# Patient Record
Sex: Female | Born: 1990
Health system: Southern US, Community
[De-identification: ages and names within clinical notes are randomized; demographics above are authoritative.]

## PROBLEM LIST (undated history)

## (undated) DIAGNOSIS — E039 Hypothyroidism, unspecified: Secondary | ICD-10-CM

## (undated) DIAGNOSIS — Z8669 Personal history of other diseases of the nervous system and sense organs: Secondary | ICD-10-CM

## (undated) DIAGNOSIS — S83519A Sprain of anterior cruciate ligament of unspecified knee, initial encounter: Secondary | ICD-10-CM

## (undated) DIAGNOSIS — Q8501 Neurofibromatosis, type 1: Secondary | ICD-10-CM

## (undated) DIAGNOSIS — J45909 Unspecified asthma, uncomplicated: Secondary | ICD-10-CM

## (undated) DIAGNOSIS — R51 Headache: Secondary | ICD-10-CM

## (undated) DIAGNOSIS — J45998 Other asthma: Secondary | ICD-10-CM

## (undated) DIAGNOSIS — Z8679 Personal history of other diseases of the circulatory system: Secondary | ICD-10-CM

## (undated) HISTORY — DX: Neurofibromatosis, type 1: Q85.01

## (undated) HISTORY — PX: TUMOR EXCISION: SHX421

## (undated) HISTORY — PX: STRABISMUS SURGERY: SHX218

## (undated) HISTORY — PX: ANTERIOR CRUCIATE LIGAMENT REPAIR: SHX115

## (undated) HISTORY — DX: Unspecified asthma, uncomplicated: J45.909

---
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
## 2011-02-14 ENCOUNTER — Emergency Department (HOSPITAL_COMMUNITY)
Admission: EM | Admit: 2011-02-14 | Discharge: 2011-02-14 | Disposition: A | Discharge status: Home / Self Care | Payer: BC Managed Care – PPO | Attending: Emergency Medicine | Admitting: Emergency Medicine

## 2011-02-14 ENCOUNTER — Emergency Department (HOSPITAL_COMMUNITY): Payer: BC Managed Care – PPO

## 2011-02-14 DIAGNOSIS — S060X0A Concussion without loss of consciousness, initial encounter: Secondary | ICD-10-CM | POA: Insufficient documentation

## 2011-02-14 DIAGNOSIS — W11XXXA Fall on and from ladder, initial encounter: Secondary | ICD-10-CM | POA: Insufficient documentation

## 2011-02-14 DIAGNOSIS — Y9269 Other specified industrial and construction area as the place of occurrence of the external cause: Secondary | ICD-10-CM | POA: Insufficient documentation

## 2011-02-14 DIAGNOSIS — T148XXA Other injury of unspecified body region, initial encounter: Secondary | ICD-10-CM | POA: Insufficient documentation

## 2011-02-14 DIAGNOSIS — Y990 Civilian activity done for income or pay: Secondary | ICD-10-CM | POA: Insufficient documentation

## 2011-03-19 ENCOUNTER — Other Ambulatory Visit: Payer: BC Managed Care – PPO

## 2011-12-13 ENCOUNTER — Encounter: Payer: Self-pay | Admitting: Gynecology

## 2011-12-14 ENCOUNTER — Encounter: Payer: Self-pay | Admitting: Gynecology

## 2012-01-26 ENCOUNTER — Ambulatory Visit (INDEPENDENT_AMBULATORY_CARE_PROVIDER_SITE_OTHER): Payer: BC Managed Care – PPO | Admitting: Family Medicine

## 2012-01-26 VITALS — BP 116/72 | HR 98 | Temp 98.8°F | Resp 18 | Ht 61.0 in | Wt 144.2 lb

## 2012-01-26 DIAGNOSIS — R059 Cough, unspecified: Secondary | ICD-10-CM

## 2012-01-26 DIAGNOSIS — Z8709 Personal history of other diseases of the respiratory system: Secondary | ICD-10-CM

## 2012-01-26 DIAGNOSIS — J302 Other seasonal allergic rhinitis: Secondary | ICD-10-CM

## 2012-01-26 DIAGNOSIS — Z87898 Personal history of other specified conditions: Secondary | ICD-10-CM

## 2012-01-26 DIAGNOSIS — R05 Cough: Secondary | ICD-10-CM

## 2012-01-26 DIAGNOSIS — J309 Allergic rhinitis, unspecified: Secondary | ICD-10-CM

## 2012-01-26 MED ORDER — IPRATROPIUM BROMIDE 0.02 % IN SOLN
0.5000 mg | Freq: Once | RESPIRATORY_TRACT | Status: AC
  Administered 2012-01-26: 0.5 mg via RESPIRATORY_TRACT

## 2012-01-26 MED ORDER — FLUTICASONE PROPIONATE 50 MCG/ACT NA SUSP: 2.0000 | Freq: Every day | NASAL | Status: DC

## 2012-01-26 MED ORDER — ALBUTEROL SULFATE (2.5 MG/3ML) 0.083% IN NEBU
2.5000 mg | INHALATION_SOLUTION | Freq: Once | RESPIRATORY_TRACT | Status: AC
  Administered 2012-01-26: 2.5 mg via RESPIRATORY_TRACT

## 2012-01-26 MED ORDER — ALBUTEROL SULFATE HFA 108 (90 BASE) MCG/ACT IN AERS: 2.0000 | INHALATION_SPRAY | Freq: Four times a day (QID) | RESPIRATORY_TRACT | Status: DC | PRN

## 2012-01-26 NOTE — Progress Notes (Signed)
[facility]:  Office Visit  Chief Complaint:  Chief Complaint  Patient presents with  . Allergic Rhinitis     x 1 month   . Cough    wheezing x 2 weeks  . Shortness of Breath    some times    HPI: Loretta [name] is a 21 y.o. female who complains of  2 week history of SOB due to pollen season. HAs allergies but not taking anything for it except Claritin. She ran out of her albuterol inhaler and has had more sxs.   Past Medical History  Diagnosis Date  . Allergy   . Asthma    Past Surgical History  Procedure Date  . Eye surgery     LEFT   History   Social History  . Marital Status: Single    Spouse Name: N/A    Number of Children: N/A  . Years of Education: N/A   Social History Main Topics  . Smoking status: Never Smoker   . Smokeless tobacco: None  . Alcohol Use: None  . Drug Use: None  . Sexually Active: None   Other Topics Concern  . None   Social History Narrative  . None   Family History  Problem Relation Age of Onset  . Diabetes Maternal Grandmother    No Known Allergies Prior to Admission medications   Medication Sig Start Date End Date Taking? Authorizing Provider  loratadine (CLARITIN) 10 MG tablet Take 10 mg by mouth daily.   Yes Historical Provider, MD  norgestimate-ethinyl estradiol (ORTHO-CYCLEN,SPRINTEC,PREVIFEM) 0.25-35 MG-MCG tablet Take 1 tablet by mouth daily.    Historical Provider, MD     ROS: The patient denies fevers, chills, night sweats, unintentional weight loss, chest pain, palpitations, wheezing, dyspnea on exertion, nausea, vomiting, abdominal pain, dysuria, hematuria, melena, numbness, weakness, or tingling. + wheeze, SOB  All other systems have been reviewed and were otherwise negative with the exception of those mentioned in the HPI and as above.    PHYSICAL EXAM: Filed Vitals:   01/26/12 1408  BP: 116/72  Pulse: 98  Temp: 98.8 F (37.1 C)  Resp: 18  Spo2 98% Filed Vitals:   01/26/12 1408    Height: 5\' 1"  (1.549 m)  Weight: 144 lb 3.2 oz (65.409 kg)   Body mass index is 27.25 kg/(m^2).  General: Alert, no acute distress HEENT:  Normocephalic, atraumatic, oropharynx patent. TM nl, no exudates, no sinus tenderness. Nasal passage red, boggy Cardiovascular:  Regular rate and rhythm, no rubs murmurs or gallops.  No Carotid bruits, radial pulse intact. No pedal edema.  Respiratory: Clear to auscultation bilaterally.  No wheezes, rales, or rhonchi.  No cyanosis, no use of accessory musculature. Bs nl, however air movement slight constricted.  GI: No organomegaly, abdomen is soft and non-tender, positive bowel sounds.  No masses. Skin: No rashes. Neurologic: Facial musculature symmetric. Psychiatric: Patient is appropriate throughout our interaction. Lymphatic: No cervical lymphadenopathy Musculoskeletal: Gait intact.   LABS: No results found for this or any previous visit.   EKG/XRAY:   Primary read interpreted by Dr. Conley Rolls at [facility]   ASSESSMENT/PLAN: Encounter Diagnoses  Name Primary?  . History of wheezing Yes  . Cough   . Allergic rhinitis   . Seasonal allergies     1. Albuterol INH 2. OTC Claritin 3. Flonase  Patient felt better after neb treatment. She had more air flow and fuller BS. She never had wheezes.   Hamilton Capri PHUONG, DO 01/26/2012 3:19 PM

## 2012-02-17 ENCOUNTER — Ambulatory Visit: Payer: Self-pay | Admitting: Internal Medicine

## 2012-02-18 ENCOUNTER — Ambulatory Visit: Payer: Self-pay | Admitting: Internal Medicine

## 2012-03-27 ENCOUNTER — Ambulatory Visit (INDEPENDENT_AMBULATORY_CARE_PROVIDER_SITE_OTHER): Payer: BC Managed Care – PPO | Admitting: Internal Medicine

## 2012-03-27 ENCOUNTER — Encounter: Payer: Self-pay | Admitting: Internal Medicine

## 2012-03-27 VITALS — BP 112/74 | HR 69 | Temp 98.3°F | Ht 60.5 in | Wt 143.0 lb

## 2012-03-27 DIAGNOSIS — J309 Allergic rhinitis, unspecified: Secondary | ICD-10-CM

## 2012-03-27 DIAGNOSIS — J45909 Unspecified asthma, uncomplicated: Secondary | ICD-10-CM | POA: Insufficient documentation

## 2012-03-27 DIAGNOSIS — Z0000 Encounter for general adult medical examination without abnormal findings: Secondary | ICD-10-CM

## 2012-03-27 HISTORY — DX: Unspecified asthma, uncomplicated: J45.909

## 2012-03-27 NOTE — Assessment & Plan Note (Signed)
See Dr. Lily Peer in few months. Recommend to bring records from her pediatrician specifically immunizations She remembers taking the HPV immunization, not sure about Menactra

## 2012-03-27 NOTE — Assessment & Plan Note (Addendum)
Controlled, take medicines as needed

## 2012-03-27 NOTE — Progress Notes (Signed)
  Subjective:    Patient ID: Loretta [name], female    DOB: Sep 01, 1991, 21 y.o.   MRN: [id number]  HPI New patient, here to get established. In general feels well. Asthma,  uses albuterol very rarely, last time she used it was in April. Allergies, uses medication when necessary  Past medical history Asthma    Past surgical history L eye surgery at months of age d/t a "tumor", vision is ok R eye surgery?  Social history Lives w/ parents, has 2 sister in [place] Attends [employer] Tobacco--no ETOH-- no Drugs-- denies  Family history Diabetes-- no CAD--no Stroke--no Colon cancer--no Breast cancer--no Prostate cancer--no   Review of Systems No recent cough or wheezing No recent runny nose, sore throat. Denies any anxiety or depression. No problems sleeping. She sees her gynecologist routinely, Dr. Lily Peer.     Objective:   Physical Exam  General -- alert, well-developed, and well-nourished.   Neck --no thyromegaly Lungs -- normal respiratory effort, no intercostal retractions, no accessory muscle use, and normal breath sounds.   Heart-- normal rate, regular rhythm, no murmur, and no gallop.   Extremities-- no pretibial edema bilaterally Neurologic-- alert & oriented X3 and strength normal in all extremities. Psych-- Cognition and judgment appear intact. Alert and cooperative with normal attention span and concentration.  not anxious appearing and not depressed appearing.      Assessment & Plan:

## 2012-03-27 NOTE — Assessment & Plan Note (Addendum)
Well-controlled,  take medicines as needed

## 2012-04-04 ENCOUNTER — Ambulatory Visit (INDEPENDENT_AMBULATORY_CARE_PROVIDER_SITE_OTHER): Payer: BC Managed Care – PPO

## 2012-04-04 ENCOUNTER — Ambulatory Visit (INDEPENDENT_AMBULATORY_CARE_PROVIDER_SITE_OTHER): Payer: BC Managed Care – PPO | Admitting: Gynecology

## 2012-04-04 ENCOUNTER — Encounter: Payer: Self-pay | Admitting: Gynecology

## 2012-04-04 VITALS — BP 110/70 | Ht 61.0 in | Wt 149.0 lb

## 2012-04-04 DIAGNOSIS — Z8742 Personal history of other diseases of the female genital tract: Secondary | ICD-10-CM

## 2012-04-04 DIAGNOSIS — E079 Disorder of thyroid, unspecified: Secondary | ICD-10-CM

## 2012-04-04 DIAGNOSIS — N946 Dysmenorrhea, unspecified: Secondary | ICD-10-CM

## 2012-04-04 DIAGNOSIS — Z01419 Encounter for gynecological examination (general) (routine) without abnormal findings: Secondary | ICD-10-CM

## 2012-04-04 DIAGNOSIS — N831 Corpus luteum cyst of ovary, unspecified side: Secondary | ICD-10-CM

## 2012-04-04 DIAGNOSIS — R635 Abnormal weight gain: Secondary | ICD-10-CM

## 2012-04-04 LAB — CBC WITH DIFFERENTIAL/PLATELET
Basophils Absolute: 0.1 10*3/uL (ref 0.0–0.1)
Eosinophils Absolute: 0.1 10*3/uL (ref 0.0–0.7)
Eosinophils Relative: 2 % (ref 0–5)
Lymphocytes Relative: 36 % (ref 12–46)
MCV: 82.5 fL (ref 78.0–100.0)
Neutrophils Relative %: 52 % (ref 43–77)
Platelets: 325 10*3/uL (ref 150–400)
RDW: 14.2 % (ref 11.5–15.5)
WBC: 5.7 10*3/uL (ref 4.0–10.5)

## 2012-04-04 NOTE — Patient Instructions (Signed)

## 2012-04-04 NOTE — Progress Notes (Signed)
Loretta [name] 1991-01-04 [id number]   History:    21 y.o.  for annual gyn exam with no complaints. Patient has never been sexually active. Last year we had done an ultrasound she had a small avascular cyst measuring 13 x 8 mm there was a question whether was a dermoid or not. Patient is having normal menstrual cycles. Last year she had been placed on oral contraceptive pill for her dysmenorrhea menorrhagia and for cycle control but she has discontinued it. She states her cycles are regular on its own lasting 3-5 days. Review of her record again she has continued to gain weight she was weighing 136 and is up to 149.  Past medical history,surgical history, family history and social history were all reviewed and documented in the EPIC chart.  Gynecologic History Patient's last menstrual period was 03/17/2012. Contraception: none Last Pap: No prior study. Results were: No prior study Last mammogram: Not indicated. Results were: Not indicated  Obstetric History OB History    Grav Para Term Preterm Abortions TAB SAB Ect Mult Living                   ROS: A ROS was performed and pertinent positives and negatives are included in the history.  GENERAL: No fevers or chills. HEENT: No change in vision, no earache, sore throat or sinus congestion. NECK: No pain or stiffness. CARDIOVASCULAR: No chest pain or pressure. No palpitations. PULMONARY: No shortness of breath, cough or wheeze. GASTROINTESTINAL: No abdominal pain, nausea, vomiting or diarrhea, melena or bright red blood per rectum. GENITOURINARY: No urinary frequency, urgency, hesitancy or dysuria. MUSCULOSKELETAL: No joint or muscle pain, no back pain, no recent trauma. DERMATOLOGIC: No rash, no itching, no lesions. ENDOCRINE: No polyuria, polydipsia, no heat or cold intolerance. No recent change in weight. HEMATOLOGICAL: No anemia or easy bruising or bleeding. NEUROLOGIC: No headache, seizures, numbness, tingling or weakness. PSYCHIATRIC: No  depression, no loss of interest in normal activity or change in sleep pattern.     Exam: chaperone present  BP 110/70  Ht 5\' 1"  (1.549 m)  Wt 149 lb (67.586 kg)  BMI 28.15 kg/m2  LMP 03/17/2012  Body mass index is 28.15 kg/(m^2).  General appearance : Well developed well nourished female. No acute distress HEENT: Neck supple, trachea midline, no carotid bruits, no thyroidmegaly Lungs: Clear to auscultation, no rhonchi or wheezes, or rib retractions  Heart: Regular rate and rhythm, no murmurs or gallops Breast:Examined in sitting and supine position were symmetrical in appearance, no palpable masses or tenderness,  no skin retraction, no nipple inversion, no nipple discharge, no skin discoloration, no axillary or supraclavicular lymphadenopathy Abdomen: no palpable masses or tenderness, no rebound or guarding Extremities: no edema or skin discoloration or tenderness  Pelvic:  Not done due to the fact that patient has never been sexually active. She will have an ultrasound today because of her dysmenorrhea and past history of ovarian cysts for followup.  Ultrasound result: Uterus measures 9.0 x 4.7 x 3.7 cm endometrial stripe 16.3 mm (patient currently on day 17 of her cycle. Right ovary was normal. Left ovary corpus luteum cyst measuring 22 x 20 mm was noted previous echogenic focus from prior ultrasound exam was not seen. Cul-de-sac was negative.   Assessment/Plan:  21 y.o. female for annual exam patient was encouraged to do her monthly self breast examination. The following labs will be drawn today CBC, TSH, fasting blood sugar, urinalysis. She will not need a Pap smear until this year  which she turns 22. New Pap smear screening guidelines discussed. She was instructed to take ibuprofen for her dysmenorrhea. We discussed importance of regular exercise and diet which she started to do so.    Ok Edwards MD, 11:30 AM 04/04/2012

## 2012-04-05 LAB — URINALYSIS W MICROSCOPIC + REFLEX CULTURE
Bacteria, UA: NONE SEEN
Bilirubin Urine: NEGATIVE
Glucose, UA: NEGATIVE mg/dL
Hgb urine dipstick: NEGATIVE
Protein, ur: NEGATIVE mg/dL

## 2012-04-05 NOTE — Addendum Note (Signed)
Addended by: Venora Maples on: 04/05/2012 11:20 AM   Modules accepted: Orders

## 2012-04-12 ENCOUNTER — Other Ambulatory Visit: Payer: BC Managed Care – PPO

## 2012-04-12 DIAGNOSIS — E079 Disorder of thyroid, unspecified: Secondary | ICD-10-CM

## 2012-04-12 LAB — THYROID PANEL WITH TSH
T3 Uptake: 36.3 % (ref 22.5–37.0)
T4, Total: 6.6 ug/dL (ref 5.0–12.5)

## 2012-05-02 ENCOUNTER — Institutional Professional Consult (permissible substitution): Payer: Self-pay | Admitting: Gynecology

## 2012-05-05 ENCOUNTER — Encounter: Payer: Self-pay | Admitting: Gynecology

## 2012-05-05 ENCOUNTER — Ambulatory Visit (INDEPENDENT_AMBULATORY_CARE_PROVIDER_SITE_OTHER): Payer: BC Managed Care – PPO | Admitting: Gynecology

## 2012-05-05 VITALS — BP 118/70

## 2012-05-05 DIAGNOSIS — E039 Hypothyroidism, unspecified: Secondary | ICD-10-CM

## 2012-05-05 MED ORDER — LEVOTHYROXINE SODIUM 25 MCG PO TABS: 25.0000 ug | ORAL_TABLET | Freq: Every day | ORAL | Status: DC

## 2012-05-05 NOTE — Progress Notes (Signed)
Patient is a 21 year old who was seen in the office for her annual gynecological examination on 04/04/2012 please see previous encounter for detail. She had been complaining of increasing weight despite regular exercise. Her menstrual cycles most of the time are regular. She is not sexually active and not using any form of contraception. She presented to the office today to discuss her laboratory results and plan a course of management. Her lab results are as follows:  Results for ROMY, IPOCK (MRN [id number]) as of 05/05/2012 14:29  Ref. Range 04/04/2012 11:23 04/12/2012 11:03  Glucose Latest Range: 70-99 mg/dL 77   WBC Latest Range: 4.0-10.5 K/uL 5.7   RBC Latest Range: 3.87-5.11 MIL/uL 4.34   Hemoglobin Latest Range: 12.0-15.0 g/dL 40.9   HCT Latest Range: 36.0-46.0 % 35.8 (L)   MCV Latest Range: 78.0-100.0 fL 82.5   MCH Latest Range: 26.0-34.0 pg 28.6   MCHC Latest Range: 30.0-36.0 g/dL 81.1   RDW Latest Range: 11.5-15.5 % 14.2   Platelets Latest Range: 150-400 K/uL 325   Neutrophils Relative Latest Range: 43-77 % 52   Lymphocytes Relative Latest Range: 12-46 % 36   Monocytes Relative Latest Range: 3-12 % 9   Eosinophils Relative Latest Range: 0-5 % 2   Basophils Relative Latest Range: 0-1 % 1   NEUT# Latest Range: 1.7-7.7 K/uL 3.0   Lymphocytes Absolute Latest Range: 0.7-4.0 K/uL 2.0   Monocytes Absolute Latest Range: 0.1-1.0 K/uL 0.5   Eosinophils Absolute Latest Range: 0.0-0.7 K/uL 0.1   Basophils Absolute Latest Range: 0.0-0.1 K/uL 0.1   Smear Review No range found Criteria for review not met   TSH Latest Range: 0.350-4.500 uIU/mL 7.554 (H) 4.769 (H)  T4, Total Latest Range: 5.0-12.5 ug/dL  6.6  Free Thyroxine Index Latest Range: 1.0-3.9   2.4    Based on patient's clinical symptomatology and thyroid function test indicates that she is hypothyroid. She did have her TSH drawn 2 times to confirm the diagnosis as noted above. We discussed treatment such as with supplemental thyroid  medication and she will be placed on Synthroid 25 mcg daily. She will return back to the office in 6 weeks for a TSH followup and she will see me in the office 3 days afterwards. Literature information was provided on hypothyroidism. All questions were answered and we'll follow accordingly.

## 2012-05-05 NOTE — Patient Instructions (Signed)

## 2012-05-10 ENCOUNTER — Institutional Professional Consult (permissible substitution): Payer: Self-pay | Admitting: Gynecology

## 2012-06-27 ENCOUNTER — Other Ambulatory Visit: Payer: Self-pay

## 2012-06-27 ENCOUNTER — Ambulatory Visit: Payer: Self-pay | Admitting: Gynecology

## 2012-06-28 ENCOUNTER — Other Ambulatory Visit: Payer: BC Managed Care – PPO

## 2012-06-28 DIAGNOSIS — E039 Hypothyroidism, unspecified: Secondary | ICD-10-CM

## 2012-06-29 ENCOUNTER — Other Ambulatory Visit: Payer: Self-pay

## 2012-06-29 ENCOUNTER — Ambulatory Visit (INDEPENDENT_AMBULATORY_CARE_PROVIDER_SITE_OTHER): Payer: BC Managed Care – PPO | Admitting: Gynecology

## 2012-06-29 ENCOUNTER — Other Ambulatory Visit: Payer: Self-pay | Admitting: Gynecology

## 2012-06-29 ENCOUNTER — Encounter: Payer: Self-pay | Admitting: Gynecology

## 2012-06-29 VITALS — BP 122/78

## 2012-06-29 DIAGNOSIS — Z23 Encounter for immunization: Secondary | ICD-10-CM

## 2012-06-29 DIAGNOSIS — E039 Hypothyroidism, unspecified: Secondary | ICD-10-CM

## 2012-06-29 MED ORDER — LEVOTHYROXINE SODIUM 50 MCG PO TABS: 50.0000 ug | ORAL_TABLET | Freq: Every day | ORAL | Status: DC

## 2012-06-29 NOTE — Progress Notes (Signed)
Patient presented to the office today for followup. She was recently diagnosed with hypothyroidism when she was seen the office on July 9 for annual exam. Her TSH was found to be elevated at 7.554 and it was repeated again on 04/12/2012 was still elevated at 4.769. She was started on Synthroid 25 mcg daily and she has been on it for 6 weeks. Yesterday's TSH was 4.561 so we are going to increase her Synthroid to 50 mcg daily. Patient stated she's getting a little bit of drainage level back she starts exercise.  Exam: Neck: No thyromegaly and no carotid bruits  Assessment/plan: Patient with hypothyroidism Synthroid was adjusted. Patient will return back in 6 weeks for followup TSH. Patient received her flu vaccine today.

## 2012-08-22 ENCOUNTER — Other Ambulatory Visit: Payer: BC Managed Care – PPO

## 2012-09-29 ENCOUNTER — Other Ambulatory Visit: Payer: Self-pay | Admitting: *Deleted

## 2012-09-29 ENCOUNTER — Other Ambulatory Visit: Payer: BC Managed Care – PPO

## 2012-09-29 ENCOUNTER — Other Ambulatory Visit: Payer: Self-pay | Admitting: Gynecology

## 2012-09-29 DIAGNOSIS — E039 Hypothyroidism, unspecified: Secondary | ICD-10-CM

## 2012-10-01 ENCOUNTER — Other Ambulatory Visit: Payer: Self-pay | Admitting: Gynecology

## 2012-12-28 ENCOUNTER — Other Ambulatory Visit: Payer: Self-pay | Admitting: Gynecology

## 2013-01-22 ENCOUNTER — Encounter: Payer: Self-pay | Admitting: Internal Medicine

## 2013-01-22 ENCOUNTER — Ambulatory Visit (INDEPENDENT_AMBULATORY_CARE_PROVIDER_SITE_OTHER): Payer: BC Managed Care – PPO | Admitting: Internal Medicine

## 2013-01-22 VITALS — BP 128/80 | HR 80 | Temp 98.2°F | Wt 148.0 lb

## 2013-01-22 DIAGNOSIS — E039 Hypothyroidism, unspecified: Secondary | ICD-10-CM

## 2013-01-22 DIAGNOSIS — J309 Allergic rhinitis, unspecified: Secondary | ICD-10-CM

## 2013-01-22 DIAGNOSIS — J45909 Unspecified asthma, uncomplicated: Secondary | ICD-10-CM

## 2013-01-22 MED ORDER — MOMETASONE FURO-FORMOTEROL FUM 100-5 MCG/ACT IN AERO: 2.0000 | INHALATION_SPRAY | Freq: Two times a day (BID) | RESPIRATORY_TRACT | Status: DC

## 2013-01-22 MED ORDER — PREDNISONE 10 MG PO TABS: ORAL_TABLET | ORAL | Status: DC

## 2013-01-22 NOTE — Patient Instructions (Addendum)
Use dulera 2 puffs twice a day. Albuterol as needed is wheezing or cough (2 puffs 4 times a day as needed) Call if you have severe symptoms. Discontinue Claritin, start Zyrtec OTC 10 mg daily Continue Flonase and other medications. Please come back in one month.

## 2013-01-22 NOTE — Assessment & Plan Note (Signed)
Requests a TSH check. Will do.

## 2013-01-22 NOTE — Progress Notes (Signed)
  Subjective:    Patient ID: Loretta [name], female    DOB: December 07, 1990, 22 y.o.   MRN: [id number]  HPI Acute visit Since the polen season started her wheezing is  > baseline, having  itchy eyes, her cough has definitely increased. This morning, she cough so hard that she almost vomited.  Past Medical History  Diagnosis Date  . Allergy   . Asthma    Past Surgical History  Procedure Laterality Date  . Eye surgery      LEFT     Review of Systems No fever chills She coughs clear sputum From time to time, has clear nasal discharge. No nausea, vomiting, diarrhea. She's not taken any birth control pills, she is denies being sexually active. Taking Flonase regularly.     Objective:   Physical Exam General -- alert, well-developed, No respiratory distress HEENT -- TMs normal, throat w/o redness, face symmetric and not tender to palpation, Nose is slightly congested Lungs -- normal respiratory effort, no intercostal retractions, no accessory muscle use, Increased expiratory time but otherwise no wheezing or rhonchi Heart-- normal rate, regular rhythm, no murmur, and no gallop.    Neurologic-- alert & oriented X3 and strength normal in all extremities. Psych-- Cognition and judgment appear intact. Alert and cooperative with normal attention span and concentration.  not anxious appearing and not depressed appearing.       Assessment & Plan:

## 2013-01-22 NOTE — Assessment & Plan Note (Signed)
Asthma, Although the patient is not wheezing today she is coughing significantly more lately and reports increased symptoms in the last few days. Plan: Start dulera, albuterol when necessary, Reassess in 4 weeks.

## 2013-01-22 NOTE — Assessment & Plan Note (Signed)
allergies,Claritin is not helping, change to Zyrtec, prednisone.

## 2013-01-24 ENCOUNTER — Encounter: Payer: Self-pay | Admitting: *Deleted

## 2013-01-24 NOTE — Progress Notes (Signed)
Quick Note:  Send a Therapist, music, Your thyroid tests looks good, continue taking the same dose of Synthroid, recheck your thyroid in 6 months. ______

## 2013-02-13 ENCOUNTER — Encounter: Payer: Self-pay | Admitting: Gynecology

## 2013-02-13 ENCOUNTER — Ambulatory Visit (INDEPENDENT_AMBULATORY_CARE_PROVIDER_SITE_OTHER): Payer: BC Managed Care – PPO | Admitting: Gynecology

## 2013-02-13 ENCOUNTER — Other Ambulatory Visit: Payer: Self-pay | Admitting: *Deleted

## 2013-02-13 ENCOUNTER — Telehealth: Payer: Self-pay | Admitting: *Deleted

## 2013-02-13 VITALS — BP 120/78

## 2013-02-13 DIAGNOSIS — N63 Unspecified lump in unspecified breast: Secondary | ICD-10-CM

## 2013-02-13 DIAGNOSIS — N643 Galactorrhea not associated with childbirth: Secondary | ICD-10-CM

## 2013-02-13 DIAGNOSIS — N6320 Unspecified lump in the left breast, unspecified quadrant: Secondary | ICD-10-CM

## 2013-02-13 NOTE — Telephone Encounter (Signed)
Apt per [facility] at [facility] - only Breast US needed on Left Breast. Apt 02/20/13 at 1110am  arrive 1100am.  LM for pt

## 2013-02-13 NOTE — Telephone Encounter (Signed)
Message copied by Richardson Chiquito on Tue Feb 13, 2013  4:26 PM ------      Message from: Ok Edwards      Created: Tue Feb 13, 2013 11:01 AM       Please schedule ultrasound of left breast on this patient and possible diagnostic mammogram for left breast pain along with a palpable nodule between 12-1 o'clock periareolar region along with unilateral galactorrhea. Normal right breast and no supraclavicular or axillary lymphadenopathy. Patient can be called tomorrow at 1 pm or today before 1 pm. ------

## 2013-02-13 NOTE — Progress Notes (Signed)
Patient is a 22 year old who presented to the office today stating that she was noticing some breast tenderness yesterday afternoon. She felt like it was throbbing at times. She denied any visual disturbances or any unusual headaches or any nipple discharge. She denies any recent trauma and no for slight relatives of breast cancer. She is not sexually active and is on no hormone contraception.  Exam: Both breasts were examined sitting supine position both breasts are symmetrical in appearance. There was no supraclavicular axillary lymphadenopathy. The left breast between the 12 and 1:00 position there was a slight irregularity and upon compression greenish like fluid extruded from the nipple. Patient stated this is the area she was tender. No other palpable masses were noted on either breast.  Assessment/plan: Left breast nodularity probably cyst. We will check her prolactin level today because of the unilateral galactorrhea on examination today. We will schedule an  ultrasound of left breast on this patient and possible diagnostic mammogram for left breast pain along with a palpable nodule between 12-1 o'clock periareolar region along with unilateral galactorrhea.

## 2013-02-13 NOTE — Telephone Encounter (Signed)
Pt informed KW 

## 2013-02-13 NOTE — Patient Instructions (Signed)
Galactorrhea Galactorrhea is when there is a milky nipple discharge. It is different from normal milk in nursing mothers. It usually comes from both nipples. Galactorrhea is not a disease but may be a symptom of a problem. It may continue for years after weaning. Galactorrhea is caused by the hormone prolactin, which stimulates milk production. If the breast discharge looks like pus, is bloody or if there is a lump present in the affected breast, the discharge may be caused by other problems including:  A benign cyst.  Papilloma.  Breast cancer.  A breast infection.  A breast abscess. It can also be seen in men who have a low or absent female hormone (testosterone) level. Galactorrhea can be present in a newborn if the mother had high female hormone (estrogen) levels that crossed into the baby through the placenta. The baby usually has enlarged breasts, but in time, it all goes away on its own. CAUSES   Tumor of the pituitary gland in the brain.  Problems with the hypothalamus in the brain that stimulates the pituitary gland.  Low thyroid function (hypothyroid disease).  Chronic kidney failure.  Medications, antidepressants, tranquilizers and blood pressure medication.  Herbal medications (nettle, fennel, blessed thistle, anise and fenugreek seed).  Illegal drugs (marijuana and opiates).  Breast stimulation during sexual activity or too many and frequent self breast exams.  Birth control pills.  Surgery or trauma to the breast causing nerve damage.  Spinal cord injury. SYMPTOMS   White, yellow or green discharge from one or both breasts.  No menstrual period (amenorrhea) or infrequent menstrual periods (hypomenorrhea).  Hot flashes, lack of sexual desire or vaginal dryness.  Infertility in women and men.  Headaches and vision problems.  Decrease in calcium in your bones (developing osteopenia or osteoporosis). DIAGNOSIS  Your caregiver may be able to know your problem  by taking a detailed history and physical exam of you. Tests that may be done, include:  Blood tests to check for the prolactin hormone, your female and thyroid hormones and a pregnancy test.  A detailed eye exam.  Mammogram.  X-rays, CT scan or MRI of breasts or your brain looking for a tumor. TREATMENT   Stopping medications that may be causing the galactorrhea.  Treating low thyroid function with thyroid hormones.  Medical or surgical (if necessary) treatment of a pituitary gland tumor.  Medication to lower the prolactin hormone level when no cause can be found.  Surgery as a last resort to remove the breasts ducts if the discharge persists with treatment and is a problem.  Treatment may not be necessary if you are not bothered by the breast discharge. HOME CARE INSTRUCTIONS   Before seeing your caregiver, make a list of all your symptoms, medications, when the breast discharge started and questions you may have.  Avoid breast stimulation during sexual activity.  Perform breast self exam once a month.  Avoid clothes that rub on your nipples.  Use breasts pads to absorb the discharge.  Wear a support bra. SEEK MEDICAL CARE IF:   You have galactorrhea and you are trying to get pregnant.  You develop hot flashes, vaginal dryness or lack of sexual desire.  You stop having menstrual periods or they are irregular or far apart.  You have headaches.  You have vision problems. SEEK IMMEDIATE MEDICAL CARE IF:   Your breast discharge is bloody or pus-like.  You have breast pain.  You feel a lump in your breast.  Your breast shows wrinkling or   dimpling.  Your breast becomes red and swollen. Document Released: 10/21/2004 Document Revised: 12/06/2011 Document Reviewed: 09/03/2008 [facility] Patient Information 2013 [place], [place].

## 2013-02-14 ENCOUNTER — Telehealth: Payer: Self-pay | Admitting: *Deleted

## 2013-02-14 NOTE — Telephone Encounter (Signed)
Pt informed with prolactin level on 02/13/13. Pt is scheduled for her ultrasound appt on 02/20/13.

## 2013-02-20 ENCOUNTER — Ambulatory Visit
Admission: RE | Admit: 2013-02-20 | Discharge: 2013-02-20 | Disposition: A | Discharge status: Home / Self Care | Payer: BC Managed Care – PPO | Source: Ambulatory Visit | Attending: Gynecology | Admitting: Gynecology

## 2013-02-20 DIAGNOSIS — N63 Unspecified lump in unspecified breast: Secondary | ICD-10-CM

## 2013-02-28 ENCOUNTER — Encounter: Payer: Self-pay | Admitting: Neurology

## 2013-02-28 ENCOUNTER — Ambulatory Visit (INDEPENDENT_AMBULATORY_CARE_PROVIDER_SITE_OTHER): Payer: BC Managed Care – PPO | Admitting: Neurology

## 2013-02-28 VITALS — BP 103/79 | HR 77 | Ht 60.0 in | Wt 149.0 lb

## 2013-02-28 DIAGNOSIS — Q8501 Neurofibromatosis, type 1: Secondary | ICD-10-CM | POA: Insufficient documentation

## 2013-02-28 NOTE — Progress Notes (Signed)
History of present illness:  Loretta [name] is a 22 years old Hispanic female, alone at today's clinical visit, she is referred by her primary care physician Dr. Eartha Inch and ophthalmologist Dr. Maple Hudson for neurological evaluation prior to her elective strabismus correction surgery, I first evaluated her in Jan 2013 for the same reason she has lost followup, she missed her elective surgical time as well.  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 She was also noticed to have multiple freckles, especially in her axillary region, inguinal region, she also has eyelid, and skull underneath skin growth, consistent with neurofibromas.   She was born with a strabismus, had correction surgery when she was 22 years old, she is now looking forward to another correction surgery of her strabismus by Dr. Verne Carrow. She is referred to have neurological consultation  to rule out central nervous system involvement from neurofibromatosis1 before proceeding with the procedure..  She denies visual loss, there was no lateralized motor or sensory deficit, she denies learning disability, no history of seizure, cardiac complaints, bone abnormalities.  Her mother has multiple skin freckles, also has subcutaneous nodules.  Review of Systems  Out of a complete 14 system review, the patient complains of only the following symptoms, and all other reviewed systems are negative.   Constitutional:   N/A Cardiovascular:  N/A Ear/Nose/Throat:  N/A Skin: N/A Eyes: stabismus Respiratory: N/A Gastroitestinal: N/A    Hematology/Lymphatic:  N/A Endocrine:  N/A Musculoskeletal:N/A Allergy/Immunology: N/A Neurological: N/A Psychiatric:    N/A  Physical Exam  Neck: supple no carotid bruits Respiratory: clear to auscultation bilaterally Cardiovascular: regular rate rhythm Skin:  Multiple cafe au lait macules, especially at bilateral axilla region, she also had few subcutaneous nodules, most likely neurofibroma, multiple cafe au lait macules, especially at bilateral axilla region, she also had few subcutaneous nodules, most likely neurofibroma at right supraorbital, occipital skull region  Neurologic Exam  Mental Status: pleasant, awake, alert, cooperative to history, talking, and casual conversation. Cranial Nerves: CN II-XII pupils were equal round reactive to light.  Fundi were sharp bilaterally.   she has multiple Lisch nodules.  Extraocular movements were full. She has right intro/hypertropia.  Visual fields were full on confrontational test.  Facial sensation and strength were normal.  Hearing was intact to finger rubbing bilaterally.  Uvula tongue were midline.  Head turning and shoulder shrugging were normal and symmetric.  Tongue protrusion into the cheeks strength were normal.  Motor: Normal tone, bulk, and strength. Sensory: Normal to light touch, pinprick, proprioception, and vibratory sensation. Coordination: Normal finger-to-nose, heel-to-shin.  There was no dysmetria noticed. Gait and Station: Narrow based and steady, was able to perform tiptoe, heel, and tandem walking without difficulty.  Romberg sign: Negative Reflexes: Deep tendon reflexes: Biceps: 2/2, Brachioradialis: 2/2, Triceps: 2/2, Pateller: 2/2, Achilles: 2/2.  Plantar responses are flexor.   Assessment and Plan:  22 years old female with neurofibromatosis type I, planning on to have elective strabismus surgery, there is no signs of central nervous system involvement based on history,  manifestation of neurofibromatosis including optic glioma, scoliosis, vertebral dysplasia, intracranial tumor,  We will proceed with MRI of the brain with without contrast, I will call her report

## 2013-03-14 ENCOUNTER — Other Ambulatory Visit: Payer: BC Managed Care – PPO

## 2013-03-21 ENCOUNTER — Ambulatory Visit (INDEPENDENT_AMBULATORY_CARE_PROVIDER_SITE_OTHER): Payer: BC Managed Care – PPO

## 2013-03-21 DIAGNOSIS — Q8501 Neurofibromatosis, type 1: Secondary | ICD-10-CM

## 2013-03-22 MED ORDER — GADOPENTETATE DIMEGLUMINE 469.01 MG/ML IV SOLN: 15.0000 mL | Freq: Once | INTRAVENOUS | Status: AC | PRN

## 2013-03-23 ENCOUNTER — Telehealth: Payer: Self-pay | Admitting: *Deleted

## 2013-03-23 ENCOUNTER — Telehealth: Payer: Self-pay | Admitting: Neurology

## 2013-03-23 NOTE — Telephone Encounter (Signed)
error 

## 2013-03-23 NOTE — Telephone Encounter (Signed)
Called patient and relayed normal MRI results.

## 2013-03-23 NOTE — Telephone Encounter (Signed)
Message copied by Salome Spotted on Fri Mar 23, 2013  1:49 PM ------      Message from: Levert Feinstein      Created: Fri Mar 23, 2013  9:12 AM       Please call pt for normal MRI brain. ------

## 2013-03-29 ENCOUNTER — Encounter: Payer: Self-pay | Admitting: Internal Medicine

## 2013-04-09 ENCOUNTER — Encounter: Payer: Self-pay | Admitting: Gynecology

## 2013-04-09 ENCOUNTER — Ambulatory Visit (INDEPENDENT_AMBULATORY_CARE_PROVIDER_SITE_OTHER): Payer: BC Managed Care – PPO | Admitting: Gynecology

## 2013-04-09 ENCOUNTER — Other Ambulatory Visit (HOSPITAL_COMMUNITY)
Admission: RE | Admit: 2013-04-09 | Discharge: 2013-04-09 | Disposition: A | Discharge status: Home / Self Care | Payer: BC Managed Care – PPO | Source: Ambulatory Visit | Attending: Gynecology | Admitting: Gynecology

## 2013-04-09 VITALS — BP 118/70 | Ht 60.5 in | Wt 144.5 lb

## 2013-04-09 DIAGNOSIS — Z01419 Encounter for gynecological examination (general) (routine) without abnormal findings: Secondary | ICD-10-CM | POA: Insufficient documentation

## 2013-04-09 DIAGNOSIS — E039 Hypothyroidism, unspecified: Secondary | ICD-10-CM

## 2013-04-09 LAB — LIPID PANEL
HDL: 44 mg/dL (ref >39)
LDL Cholesterol: 91 mg/dL (ref 0–99)
Triglycerides: 82 mg/dL (ref <150)

## 2013-04-09 LAB — CBC WITH DIFFERENTIAL/PLATELET
Eosinophils Relative: 2 % (ref 0–5)
HCT: 37.8 % (ref 36.0–46.0)
Lymphocytes Relative: 33 % (ref 12–46)
Lymphs Abs: 2 10*3/uL (ref 0.7–4.0)
MCV: 81.6 fL (ref 78.0–100.0)
Monocytes Absolute: 0.5 10*3/uL (ref 0.1–1.0)
Platelets: 333 10*3/uL (ref 150–400)
RBC: 4.63 MIL/uL (ref 3.87–5.11)
WBC: 6 10*3/uL (ref 4.0–10.5)

## 2013-04-09 LAB — GLUCOSE, RANDOM: Glucose, Bld: 83 mg/dL (ref 70–99)

## 2013-04-09 NOTE — Patient Instructions (Addendum)

## 2013-04-09 NOTE — Progress Notes (Signed)
Loretta [name] 1991/05/20 [id number]   History:    22 y.o.  for annual gyn exam with no complaints today. She does have history of hypothyroidism for which she is on Synthroid 50 mcg daily and has been doing well. She lost proximally 5 pounds since last year. She has been followed by the ophthalmologist and neurologist. Patient was diagnosed with neurofibromatosis. Patient not sexually active. This is patient's first Pap smear. Normal menstrual cycles. Patient not sexually active.  Past medical history,surgical history, family history and social history were all reviewed and documented in the EPIC chart.  Gynecologic History Patient's last menstrual period was 03/17/2013. Contraception: none Last Pap: no prior study. Results were: no prior study Last mammogram: none indicated. Results were: none indicated  Obstetric History OB History   Grav Para Term Preterm Abortions TAB SAB Ect Mult Living                   ROS: A ROS was performed and pertinent positives and negatives are included in the history.  GENERAL: No fevers or chills. HEENT: No change in vision, no earache, sore throat or sinus congestion. NECK: No pain or stiffness. CARDIOVASCULAR: No chest pain or pressure. No palpitations. PULMONARY: No shortness of breath, cough or wheeze. GASTROINTESTINAL: No abdominal pain, nausea, vomiting or diarrhea, melena or bright red blood per rectum. GENITOURINARY: No urinary frequency, urgency, hesitancy or dysuria. MUSCULOSKELETAL: No joint or muscle pain, no back pain, no recent trauma. DERMATOLOGIC: No rash, no itching, no lesions. ENDOCRINE: No polyuria, polydipsia, no heat or cold intolerance. No recent change in weight. HEMATOLOGICAL: No anemia or easy bruising or bleeding. NEUROLOGIC: No headache, seizures, numbness, tingling or weakness. PSYCHIATRIC: No depression, no loss of interest in normal activity or change in sleep pattern.     Exam: chaperone present  BP 118/70  Ht 5' 0.5" (1.537  m)  Wt 144 lb 8 oz (65.545 kg)  BMI 27.75 kg/m2  LMP 03/17/2013  Body mass index is 27.75 kg/(m^2).  General appearance : Well developed well nourished female. No acute distress HEENT: Neck supple, trachea midline, no carotid bruits, no thyroidmegaly Lungs: Clear to auscultation, no rhonchi or wheezes, or rib retractions  Heart: Regular rate and rhythm, no murmurs or gallops Breast:Examined in sitting and supine position were symmetrical in appearance, no palpable masses or tenderness,  no skin retraction, no nipple inversion, no nipple discharge, no skin discoloration, no axillary or supraclavicular lymphadenopathy Abdomen: no palpable masses or tenderness, no rebound or guarding Extremities: no edema or skin discoloration or tenderness  Pelvic:  Bartholin, Urethra, Skene Glands: Within normal limits             Vagina: No gross lesions or discharge  Cervix: No gross lesions or discharge  Uterus not done secondary to vaginismus  Adnexa Not done secondary to vaginismus Anus and perineum  normal   Rectovaginal  normal sphincter tone without palpated masses or tenderness             Hemoccult none indicated     Assessment/Plan:  22 y.o. female for first Pap smear. Patient having normal menstrual cycle. Patient not sexually active. Pap smear was difficult due to patient's vaginismus but sample was obtained. Will await to mixture to do the full bimanual exam. The following labs were ordered: TSH, fasting lipid profile, CBC, urinalysis, and fasting blood sugar along with urinalysis. Patient was reminded to do her monthly breast exam.   Ok Edwards MD, 10:38 AM 04/09/2013

## 2013-04-10 LAB — URINALYSIS W MICROSCOPIC + REFLEX CULTURE
Bacteria, UA: NONE SEEN
Bilirubin Urine: NEGATIVE
Casts: NONE SEEN
Hgb urine dipstick: NEGATIVE
Ketones, ur: NEGATIVE mg/dL
Nitrite: NEGATIVE
pH: 5.5 (ref 5.0–8.0)

## 2013-04-17 ENCOUNTER — Encounter: Payer: Self-pay | Admitting: Internal Medicine

## 2013-04-17 ENCOUNTER — Ambulatory Visit (INDEPENDENT_AMBULATORY_CARE_PROVIDER_SITE_OTHER): Payer: BC Managed Care – PPO | Admitting: Internal Medicine

## 2013-04-17 VITALS — BP 98/70 | HR 96 | Temp 98.6°F | Ht 61.0 in | Wt 144.0 lb

## 2013-04-17 DIAGNOSIS — E039 Hypothyroidism, unspecified: Secondary | ICD-10-CM

## 2013-04-17 DIAGNOSIS — J4520 Mild intermittent asthma, uncomplicated: Secondary | ICD-10-CM

## 2013-04-17 DIAGNOSIS — Z0000 Encounter for general adult medical examination without abnormal findings: Secondary | ICD-10-CM

## 2013-04-17 NOTE — Patient Instructions (Addendum)
Please make an appointment with our counselor Mrs Rolm Baptise  to see about ADD, if the diagnosis is confirmed come back to talk about treatment options  Next visit 6 months Don't forget a flushot this season Bring records from your pediatrician regards immunizations     Safe Sex Safe sex is about reducing the risk of giving or getting a sexually transmitted disease (STD). STDs are spread through sexual contact involving the genitals, mouth, or rectum. Some STDS can be cured and others cannot. Safe sex can also prevent unintended pregnancies.  SAFE SEX PRACTICES  Limit your sexual activity to only one partner who is only having sex with you.  Talk to your partner about their past partners, past STDs, and drug use.  Use a condom every time you have sexual intercourse. This includes vaginal, oral, and anal sexual activity. Both females and males should wear condoms during oral sex. Only use latex or polyurethane condoms and water-based lubricants. Petroleum-based lubricants or oils used to lubricate a condom will weaken the condom and increase the chance that it will break. The condom should be in place from the beginning to the end of sexual activity. Wearing a condom reduces, but does not completely eliminate, your risk of getting or giving a STD. STDs can be spread by contact with skin of surrounding areas.  Get vaccinated for hepatitis B and HPV.  Avoid alcohol and recreational drugs which can affect your judgement. You may forget to use a condom or participate in high-risk sex.  For females, avoid douching after sexual intercourse. Douching can spread an infection farther into the reproductive tract.  Check your body for signs of sores, blisters, rashes, or unusual discharge. See your caregiver if you notice any of these signs.  Avoid sexual contact if you have symptoms of an infection or are being treated for an STD. If you or your partner has herpes, avoid sexual contact when blisters are  present. Use condoms at all other times.  See your caregiver for regular screenings, examinations, and tests for STDs. Before having sex with a new partner, each of you should be screened for STDs and talk about the results with your partner. BENEFITS OF SAFE SEX   There is less of a chance of getting or giving an STD.  You can prevent unwanted or unintended pregnancies.  By discussing safer sex concerns with your partner, you may increase feelings of intimacy, comfort, trust, and honesty between the both of you. Document Released: 10/21/2004 Document Revised: 06/07/2012 Document Reviewed: 03/06/2012 [facility] Patient Information 2014 [place], [place].

## 2013-04-17 NOTE — Assessment & Plan Note (Addendum)
See Dr. Lily Peer for gyn care  Had  HPVs but no records of previous shots---> recommend to bring records from her pediatrician    diet, exercise, safe sex, safe driving discussed Labs  Previous labs discussed ADD? See ROS, rec to see a counselor

## 2013-04-17 NOTE — Assessment & Plan Note (Signed)
Under excellent control. Rarely uses dulera or albuterol

## 2013-04-17 NOTE — Assessment & Plan Note (Signed)
Last TSH 3.2, no change, RTC 6 months

## 2013-04-17 NOTE — Progress Notes (Signed)
  Subjective:    Patient ID: Loretta [name], female    DOB: 08/13/1991, 22 y.o.   MRN: [id number]  HPI Complete physical exam  Past Medical History  Diagnosis Date  . Allergy   . Asthma   . Strabismus   . Neurofibromatosis, type 1 (von Recklinghausen's disease)     sees neurology once a year   Past Surgical History  Procedure Laterality Date  . Eye surgery      x 2    History   Social History  . Marital Status: Single    Spouse Name: N/A    Number of Children: 0  . Years of Education: College   Occupational History  . Sales     [employer]   Social History Main Topics  . Smoking status: Never Smoker   . Smokeless tobacco: Never Used  . Alcohol Use: 0.6 oz/week    1 Cans of beer per week     Comment: OCC  . Drug Use: No  . Sexually Active: No   Other Topics Concern  . Not on file   Social History Narrative   Patient lives at home with her parents.    Patient works at [employer].     Caffeine one cup daily.    Left handed.    To finish college, [employer], in about a year.   Family History  Problem Relation Age of Onset  . Diabetes Maternal Grandmother   . CAD Neg Hx   . Colon cancer Neg Hx   . Breast cancer Neg Hx      Review of Systems Denies chest pain or shortness or breath No nausea, vomiting, diarrhea. No anxiety or depression . Thinks she may have ADD, long history of difficulty concentrating and "daydreaming", symptoms are slightly getting worse. Would like to explore treatment. Asthma under excellent control, hardly ever needs dulera or  albuterol. Good compliance with Synthroid.     Objective:   Physical Exam BP 98/70  Pulse 96  Temp(Src) 98.6 F (37 C) (Oral)  Ht 5\' 1"  (1.549 m)  Wt 144 lb (65.318 kg)  BMI 27.22 kg/m2  SpO2 99%  LMP 03/17/2013  General -- alert, well-developed, NAD .   Neck --no thyromegaly   Lungs -- normal respiratory effort, no intercostal retractions, no accessory muscle use, and normal breath sounds.    Heart-- normal rate, regular rhythm, no murmur, and no gallop.   Abdomen--soft, non-tender, no distention, no masses, no HSM, no guarding, and no rigidity.   Extremities-- no pretibial edema bilaterally  Neurologic-- alert & oriented X3 and strength normal in all extremities. Psych-- Cognition and judgment appear intact. Alert and cooperative with normal attention span and concentration.  not anxious appearing and not depressed appearing.       Assessment & Plan:

## 2013-04-19 ENCOUNTER — Telehealth: Payer: Self-pay | Admitting: *Deleted

## 2013-04-19 NOTE — Telephone Encounter (Signed)
Message copied by Neldon Newport on Thu Apr 19, 2013  2:31 PM ------      Message from: Willow Ora E      Created: Tue Apr 17, 2013  6:34 PM       Could you look into the online [place] data base on find out her immunizations?      Chrae can show you how to do it ------

## 2013-04-19 NOTE — Telephone Encounter (Signed)
Unable to access immunization records. Dois Davenport, can you assist with this matter?

## 2013-04-19 NOTE — Telephone Encounter (Signed)
Chrae,  Can you help Judeth Cornfield with this?   Dois Davenport

## 2013-04-20 NOTE — Telephone Encounter (Signed)
Immunizations printed from [place] and placed into the system. Will forward to MD for review      KP/CMA

## 2013-05-12 ENCOUNTER — Other Ambulatory Visit: Payer: Self-pay | Admitting: Gynecology

## 2013-05-14 NOTE — Telephone Encounter (Signed)
lmovm to return call  °

## 2013-05-14 NOTE — Telephone Encounter (Signed)
Spoke with patient, not sure if she wants to receive vaccine or not. Wants to speak with her family and contact her insurance for coverage and then contact our office back if interested.

## 2013-05-14 NOTE — Telephone Encounter (Signed)
Immunizations reviewed, I don't see Menactra.  Please call all arrange a Menactra immunization if the patient is agreeable

## 2013-06-07 ENCOUNTER — Ambulatory Visit (INDEPENDENT_AMBULATORY_CARE_PROVIDER_SITE_OTHER): Payer: BC Managed Care – PPO | Admitting: Licensed Clinical Social Worker

## 2013-06-07 DIAGNOSIS — F411 Generalized anxiety disorder: Secondary | ICD-10-CM

## 2013-06-21 ENCOUNTER — Other Ambulatory Visit (INDEPENDENT_AMBULATORY_CARE_PROVIDER_SITE_OTHER): Payer: BC Managed Care – PPO

## 2013-06-21 ENCOUNTER — Ambulatory Visit: Payer: BC Managed Care – PPO | Admitting: Licensed Clinical Social Worker

## 2013-06-21 DIAGNOSIS — F411 Generalized anxiety disorder: Secondary | ICD-10-CM

## 2013-06-28 ENCOUNTER — Ambulatory Visit (INDEPENDENT_AMBULATORY_CARE_PROVIDER_SITE_OTHER): Payer: BC Managed Care – PPO | Admitting: Licensed Clinical Social Worker

## 2013-06-28 DIAGNOSIS — F411 Generalized anxiety disorder: Secondary | ICD-10-CM

## 2013-10-18 ENCOUNTER — Ambulatory Visit: Payer: Worker's Compensation | Admitting: Internal Medicine

## 2013-10-31 ENCOUNTER — Ambulatory Visit: Payer: Worker's Compensation | Admitting: Internal Medicine

## 2013-11-07 ENCOUNTER — Encounter: Payer: Self-pay | Admitting: Internal Medicine

## 2013-11-07 ENCOUNTER — Ambulatory Visit (INDEPENDENT_AMBULATORY_CARE_PROVIDER_SITE_OTHER): Payer: BC Managed Care – PPO | Admitting: Internal Medicine

## 2013-11-07 VITALS — BP 104/71 | HR 90 | Temp 97.9°F | Wt 153.0 lb

## 2013-11-07 DIAGNOSIS — J45909 Unspecified asthma, uncomplicated: Secondary | ICD-10-CM

## 2013-11-07 DIAGNOSIS — E039 Hypothyroidism, unspecified: Secondary | ICD-10-CM

## 2013-11-07 DIAGNOSIS — K219 Gastro-esophageal reflux disease without esophagitis: Secondary | ICD-10-CM | POA: Insufficient documentation

## 2013-11-07 NOTE — Assessment & Plan Note (Signed)
Currently not taking any medication, symptoms usually resurface during the spring. See instructions

## 2013-11-07 NOTE — Assessment & Plan Note (Addendum)
New issue. Symptoms as described in the history of present illness likely from acid reflux, educated about the dx,precautions discussed, omeprazole OTC x 4-6 weeks then as needed. If not improving she will let me now.

## 2013-11-07 NOTE — Assessment & Plan Note (Signed)
Reports good compliance with meds, check a TSH

## 2013-11-07 NOTE — Progress Notes (Signed)
Pre visit review using our clinic review tool, if applicable. No additional management support is needed unless otherwise documented below in the visit note. 

## 2013-11-07 NOTE — Progress Notes (Signed)
   Subjective:    Patient ID: Loretta SachsLisette [name], female    DOB: [birth year], 23 y.o.   MRN: [id number]  DOS:  11/07/2013 ROV, we   discussed the following issues,   Hypothyroidism--good medication compliance Asthma does-currently not taking any medications, usually symptoms resurface in  spring Also for a while is having problems with her swallowing: reports that with certain foods she feels like the food is coming back, and she gags from time to time. Admits that she eats really fast. Denies any classic heartburn. Not taking any medication for this particular symptom  Past Medical History  Diagnosis Date  . Allergy   . Asthma   . Strabismus   . Neurofibromatosis, type 1 (von Recklinghausen's disease)     sees neurology once a year  . GERD (gastroesophageal reflux disease) 11/07/2013    Past Surgical History  Procedure Laterality Date  . Eye surgery      x 2     History   Social History  . Marital Status: Single    Spouse Name: N/A    Number of Children: 0  . Years of Education: College   Occupational History  . Sales     [employer]   Social History Main Topics  . Smoking status: Never Smoker   . Smokeless tobacco: Never Used  . Alcohol Use: 0.6 oz/week    1 Cans of beer per week     Comment: OCC  . Drug Use: No  . Sexual Activity: No   Other Topics Concern  . Not on file   Social History Narrative   Patient lives at home with her parents.    Patient works at [employer].     Caffeine one cup daily.    Left handed.    To finish college, [employer], in about a year.    ROS No weight loss Denies nausea, vomiting or changes in the color of the stools. No abdominal pain, No cough with food intake, no odynophagia. Denies any cough, wheezing, shortness or breath at this time.     Objective:   Physical Exam BP 104/71  Pulse 90  Temp(Src) 97.9 F (36.6 C)  Wt 153 lb (69.4 kg)  SpO2 100% General -- alert, well-developed, NAD.   Lungs -- normal  respiratory effort, no intercostal retractions, no accessory muscle use, and normal breath sounds.  Heart-- normal rate, regular rhythm, no murmur.  Abdomen-- Not distended, good bowel sounds,soft, non-tender. No rebound or rigidity. No mass,organomegaly.  Extremities-- no pretibial edema bilaterally  Neurologic--  alert & oriented X3. Speech normal, gait normal, strength normal in all extremities.   Psych-- Cognition and judgment appear intact. Cooperative with normal attention span and concentration. No anxious or depressed appearing.     Assessment & Plan:

## 2013-11-07 NOTE — Patient Instructions (Addendum)
Get your blood work before you leave   Next visit is for a physical exam in 6 months , fasting Please make an appointment    Omeprazole (Prilosec) 20 mg OTC one tablet before breakfast  When spring starts: take DULERA  twice a day, your nasal spray and albuterol if wheezing Call if symptoms severe   Diet for Gastroesophageal Reflux Disease, Adult Reflux (acid reflux) is when acid from your stomach flows up into the esophagus. When acid comes in contact with the esophagus, the acid causes irritation and soreness (inflammation) in the esophagus. When reflux happens often or so severely that it causes damage to the esophagus, it is called gastroesophageal reflux disease (GERD). Nutrition therapy can help ease the discomfort of GERD. FOODS OR DRINKS TO AVOID OR LIMIT  Smoking or chewing tobacco. Nicotine is one of the most potent stimulants to acid production in the gastrointestinal tract.  Caffeinated and decaffeinated coffee and black tea.  Regular or low-calorie carbonated beverages or energy drinks (caffeine-free carbonated beverages are allowed).   Strong spices, such as black pepper, white pepper, red pepper, cayenne, curry powder, and chili powder.  Peppermint or spearmint.  Chocolate.  High-fat foods, including meats and fried foods. Extra added fats including oils, butter, salad dressings, and nuts. Limit these to less than 8 tsp per day.  Fruits and vegetables if they are not tolerated, such as citrus fruits or tomatoes.  Alcohol.  Any food that seems to aggravate your condition. If you have questions regarding your diet, call your caregiver or a registered dietitian. OTHER THINGS THAT MAY HELP GERD INCLUDE:   Eating your meals slowly, in a relaxed setting.  Eating 5 to 6 small meals per day instead of 3 large meals.  Eliminating food for a period of time if it causes distress.  Not lying down until 3 hours after eating a meal.  Keeping the head of your bed raised  6 to 9 inches (15 to 23 cm) by using a foam wedge or blocks under the legs of the bed. Lying flat may make symptoms worse.  Being physically active. Weight loss may be helpful in reducing reflux in overweight or obese adults.  Wear loose fitting clothing EXAMPLE MEAL PLAN This meal plan is approximately 2,000 calories based on https://www.[place].org/ChooseMyPlate.gov meal planning guidelines. Breakfast   cup cooked oatmeal.  1 cup strawberries.  1 cup low-fat milk.  1 oz almonds. Snack  1 cup cucumber slices.  6 oz yogurt (made from low-fat or fat-free milk). Lunch  2 slice whole-wheat bread.  2 oz sliced [place].  2 tsp mayonnaise.  1 cup blueberries.  1 cup snap peas. Snack  6 whole-wheat crackers.  1 oz string cheese. Dinner   cup brown rice.  1 cup mixed veggies.  1 tsp olive oil.  3 oz grilled fish. Document Released: 09/13/2005 Document Revised: 12/06/2011 Document Reviewed: 07/30/2011 [facility] Patient Information 2014 [place].

## 2013-11-08 ENCOUNTER — Encounter: Payer: Self-pay | Admitting: Internal Medicine

## 2013-11-08 LAB — TSH: TSH: 3.92 u[IU]/mL (ref 0.35–5.50)

## 2013-11-12 ENCOUNTER — Encounter: Payer: Self-pay | Admitting: *Deleted

## 2014-01-02 ENCOUNTER — Other Ambulatory Visit: Payer: Self-pay | Admitting: Internal Medicine

## 2014-02-22 ENCOUNTER — Telehealth: Payer: Self-pay | Admitting: Internal Medicine

## 2014-02-22 NOTE — Telephone Encounter (Signed)
Caller name: Malaka Rizvi Relation to ID:[id number] Call back number: [phone] Pharmacy:  Reason for call: patient called to inform us that she is having eye surgery on June 5 th. Also patient is concerned that as a baby she had a heart murmur and wanted to make sure she does not need an EKG before her eye surgery. Please advise

## 2014-02-25 ENCOUNTER — Encounter (HOSPITAL_BASED_OUTPATIENT_CLINIC_OR_DEPARTMENT_OTHER): Payer: Self-pay | Admitting: *Deleted

## 2014-02-25 NOTE — Telephone Encounter (Signed)
Left message on voice mail for the patient to advise that heart sounds at CPE were normal and that she can proceed with eye surgery as planned.

## 2014-02-25 NOTE — Pre-Procedure Instructions (Signed)
Pt's sister, Joycelyn Schmid, will be interpreter for pt's parents.  Pt. speaks fluent [place], parents speak [place].  Will need to sign release to use sister as interpreter.

## 2014-02-28 ENCOUNTER — Other Ambulatory Visit: Payer: Self-pay | Admitting: Ophthalmology

## 2014-02-28 ENCOUNTER — Ambulatory Visit: Payer: BC Managed Care – PPO | Admitting: Nurse Practitioner

## 2014-02-28 NOTE — H&P (Signed)
  Date of examination:  02-21-14   Indication for surgery: to straighten the eyes and allow some binocularity  Pertinent past medical history:  Past Medical History  Diagnosis Date  . Neurofibromatosis, type 1 (von Recklinghausen's disease)   . Headache(784.0)     stress or migraine  . Hypothyroidism   . Asthma     prn inhaler  . Esotropia of both eyes 02/2014  . History of cardiac murmur     "no murmur" per PCP 11/07/2013  . 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  1998 RSR recess 6.0 mm  Pertinent family history:  Family History  Problem Relation Age of Onset  . Diabetes Maternal Grandmother     General:  Healthy appearing patient in no distress.    Eyes:    Acuity cc   OD 20/20  OS 20/20  External: Within normal limits x lower eyelid asymmetry (more inf scleral show OD)    Anterior segment: Healed conj scars inf OS and sup OD.  Lisch nodules OU  Motility:   ET = 15 in primary and L gaze, 6 in R gaze. + "A".  ET' = 20.  RHT = 8 + 10 DVD = 18.  horiz comitant.  2- LSR  Fundus: Normal     Refraction:  Cycloplegic   OD -4 approx  OS -2 approx  Heart: Regular rate and rhythm without murmur     Lungs: Clear to auscultation     Abdomen: Soft, nontender, normal bowel sounds     Impression:RHT residual/recurrent, s/p RSR recess for RHT following L inf orbitotomy.  Note limitation of elevation of OS. LIR  FD reportedly negative at time of initial strabismus surgery  Plan: Forced ductions to assess restriction to elevation of OS.  IF tight, consider LIR recess/explore/free restrictions.  If free, re-recess RSR, taking care to avoid disrupting RSO insertion  Shara Blazing

## 2014-03-01 ENCOUNTER — Encounter (HOSPITAL_BASED_OUTPATIENT_CLINIC_OR_DEPARTMENT_OTHER)
Admission: RE | Disposition: A | Discharge status: Home / Self Care | Payer: Self-pay | Source: Ambulatory Visit | Attending: Ophthalmology

## 2014-03-01 ENCOUNTER — Ambulatory Visit (HOSPITAL_BASED_OUTPATIENT_CLINIC_OR_DEPARTMENT_OTHER): Payer: BC Managed Care – PPO | Admitting: Anesthesiology

## 2014-03-01 ENCOUNTER — Ambulatory Visit (HOSPITAL_BASED_OUTPATIENT_CLINIC_OR_DEPARTMENT_OTHER)
Admission: RE | Admit: 2014-03-01 | Discharge: 2014-03-01 | Disposition: A | Discharge status: Home / Self Care | Payer: BC Managed Care – PPO | Source: Ambulatory Visit | Attending: Ophthalmology | Admitting: Ophthalmology

## 2014-03-01 ENCOUNTER — Encounter (HOSPITAL_BASED_OUTPATIENT_CLINIC_OR_DEPARTMENT_OTHER): Payer: Self-pay | Admitting: Anesthesiology

## 2014-03-01 ENCOUNTER — Encounter (HOSPITAL_BASED_OUTPATIENT_CLINIC_OR_DEPARTMENT_OTHER): Payer: BC Managed Care – PPO | Admitting: Anesthesiology

## 2014-03-01 DIAGNOSIS — J45909 Unspecified asthma, uncomplicated: Secondary | ICD-10-CM | POA: Insufficient documentation

## 2014-03-01 DIAGNOSIS — IMO0002 Reserved for concepts with insufficient information to code with codable children: Secondary | ICD-10-CM | POA: Insufficient documentation

## 2014-03-01 DIAGNOSIS — E039 Hypothyroidism, unspecified: Secondary | ICD-10-CM | POA: Insufficient documentation

## 2014-03-01 DIAGNOSIS — Q8500 Neurofibromatosis, unspecified: Secondary | ICD-10-CM | POA: Insufficient documentation

## 2014-03-01 HISTORY — DX: Personal history of other diseases of the circulatory system: Z86.79

## 2014-03-01 HISTORY — DX: Hypothyroidism, unspecified: E03.9

## 2014-03-01 HISTORY — DX: Headache: R51

## 2014-03-01 HISTORY — PX: STRABISMUS SURGERY: SHX218

## 2014-03-01 LAB — POCT HEMOGLOBIN-HEMACUE: HEMOGLOBIN: 11 g/dL — AB (ref 12.0–15.0)

## 2014-03-01 SURGERY — STRABISMUS SURGERY, BILATERAL
Anesthesia: General | Site: Eye | Laterality: Right

## 2014-03-01 MED ORDER — HYDROMORPHONE HCL PF 1 MG/ML IJ SOLN
INTRAMUSCULAR | Status: AC
  Filled 2014-03-01: qty 1

## 2014-03-01 MED ORDER — BSS IO SOLN
INTRAOCULAR | Status: DC | PRN
  Administered 2014-03-01: 10 mL via INTRAOCULAR

## 2014-03-01 MED ORDER — MIDAZOLAM HCL 5 MG/5ML IJ SOLN
INTRAMUSCULAR | Status: DC | PRN
  Administered 2014-03-01: 2 mg via INTRAVENOUS

## 2014-03-01 MED ORDER — ONDANSETRON HCL 4 MG/2ML IJ SOLN
INTRAMUSCULAR | Status: DC | PRN
  Administered 2014-03-01: 4 mg via INTRAVENOUS

## 2014-03-01 MED ORDER — MIDAZOLAM HCL 2 MG/2ML IJ SOLN
INTRAMUSCULAR | Status: AC
  Filled 2014-03-01: qty 2

## 2014-03-01 MED ORDER — MIDAZOLAM HCL 2 MG/ML PO SYRP: 12.0000 mg | ORAL_SOLUTION | Freq: Once | ORAL | Status: DC | PRN

## 2014-03-01 MED ORDER — FENTANYL CITRATE 0.05 MG/ML IJ SOLN
INTRAMUSCULAR | Status: AC
  Filled 2014-03-01: qty 4

## 2014-03-01 MED ORDER — MIDAZOLAM HCL 2 MG/2ML IJ SOLN: 1.0000 mg | INTRAMUSCULAR | Status: DC | PRN

## 2014-03-01 MED ORDER — ATROPINE SULFATE 0.4 MG/ML IJ SOLN
INTRAMUSCULAR | Status: DC | PRN
  Administered 2014-03-01: .2 mg via INTRAVENOUS

## 2014-03-01 MED ORDER — DEXAMETHASONE SODIUM PHOSPHATE 4 MG/ML IJ SOLN
INTRAMUSCULAR | Status: DC | PRN
  Administered 2014-03-01: 10 mg via INTRAVENOUS

## 2014-03-01 MED ORDER — FENTANYL CITRATE 0.05 MG/ML IJ SOLN: 50.0000 ug | INTRAMUSCULAR | Status: DC | PRN

## 2014-03-01 MED ORDER — PROMETHAZINE HCL 25 MG/ML IJ SOLN: 6.2500 mg | INTRAMUSCULAR | Status: DC | PRN

## 2014-03-01 MED ORDER — TOBRAMYCIN-DEXAMETHASONE 0.3-0.1 % OP OINT
TOPICAL_OINTMENT | OPHTHALMIC | Status: DC | PRN
  Administered 2014-03-01: 1 via OPHTHALMIC

## 2014-03-01 MED ORDER — OXYCODONE HCL 5 MG PO TABS: 5.0000 mg | ORAL_TABLET | Freq: Once | ORAL | Status: DC | PRN

## 2014-03-01 MED ORDER — OXYCODONE-ACETAMINOPHEN 7.5-325 MG PO TABS: 1.0000 | ORAL_TABLET | ORAL | Status: DC | PRN

## 2014-03-01 MED ORDER — HYDROMORPHONE HCL PF 1 MG/ML IJ SOLN
0.2500 mg | INTRAMUSCULAR | Status: DC | PRN
  Administered 2014-03-01 (×2): 0.5 mg via INTRAVENOUS

## 2014-03-01 MED ORDER — TOBRAMYCIN-DEXAMETHASONE 0.3-0.1 % OP OINT
TOPICAL_OINTMENT | OPHTHALMIC | Status: AC
  Filled 2014-03-01: qty 3.5

## 2014-03-01 MED ORDER — FENTANYL CITRATE 0.05 MG/ML IJ SOLN
INTRAMUSCULAR | Status: DC | PRN
  Administered 2014-03-01: 25 ug via INTRAVENOUS
  Administered 2014-03-01: 100 ug via INTRAVENOUS

## 2014-03-01 MED ORDER — LACTATED RINGERS IV SOLN
INTRAVENOUS | Status: DC
Start: 2014-03-01 — End: 2014-03-01
  Administered 2014-03-01: 08:00:00 via INTRAVENOUS

## 2014-03-01 MED ORDER — PROPOFOL 10 MG/ML IV BOLUS
INTRAVENOUS | Status: DC | PRN
  Administered 2014-03-01: 200 mg via INTRAVENOUS

## 2014-03-01 MED ORDER — LIDOCAINE HCL (CARDIAC) 20 MG/ML IV SOLN
INTRAVENOUS | Status: DC | PRN
  Administered 2014-03-01: 40 mg via INTRAVENOUS

## 2014-03-01 MED ORDER — OXYCODONE HCL 5 MG/5ML PO SOLN: 5.0000 mg | Freq: Once | ORAL | Status: DC | PRN

## 2014-03-01 MED ORDER — KETOROLAC TROMETHAMINE 30 MG/ML IJ SOLN
INTRAMUSCULAR | Status: DC | PRN
  Administered 2014-03-01: 30 mg via INTRAVENOUS

## 2014-03-01 SURGICAL SUPPLY — 30 items
APPLICATOR COTTON TIP 6IN STRL (MISCELLANEOUS) ×12 IMPLANT
APPLICATOR DR MATTHEWS STRL (MISCELLANEOUS) ×3 IMPLANT
BANDAGE EYE OVAL (MISCELLANEOUS) IMPLANT
CAUTERY EYE LOW TEMP 1300F FIN (OPHTHALMIC RELATED) IMPLANT
CLOSURE WOUND 1/4X4 (GAUZE/BANDAGES/DRESSINGS)
COVER MAYO STAND STRL (DRAPES) ×3 IMPLANT
COVER TABLE BACK 60X90 (DRAPES) ×3 IMPLANT
DRAPE SURG 17X23 STRL (DRAPES) ×6 IMPLANT
DRAPE U-SHAPE 76X120 STRL (DRAPES) ×3 IMPLANT
GLOVE BIO SURGEON STRL SZ 6.5 (GLOVE) ×2 IMPLANT
GLOVE BIO SURGEONS STRL SZ 6.5 (GLOVE) ×1
GLOVE BIOGEL M STRL SZ7.5 (GLOVE) ×12 IMPLANT
GOWN STRL REUS W/ TWL LRG LVL3 (GOWN DISPOSABLE) ×1 IMPLANT
GOWN STRL REUS W/TWL LRG LVL3 (GOWN DISPOSABLE) ×2
GOWN STRL REUS W/TWL XL LVL3 (GOWN DISPOSABLE) ×3 IMPLANT
NS IRRIG 1000ML POUR BTL (IV SOLUTION) ×3 IMPLANT
PACK BASIN DAY SURGERY FS (CUSTOM PROCEDURE TRAY) ×3 IMPLANT
SHEET MEDIUM DRAPE 40X70 STRL (DRAPES) IMPLANT
SPEAR EYE SURG WECK-CEL (MISCELLANEOUS) ×9 IMPLANT
STRIP CLOSURE SKIN 1/4X4 (GAUZE/BANDAGES/DRESSINGS) IMPLANT
SUT 6 0 SILK T G140 8DA (SUTURE) IMPLANT
SUT MERSILENE 6 0 S14 DA (SUTURE) IMPLANT
SUT PLAIN 6 0 TG1408 (SUTURE) ×3 IMPLANT
SUT SILK 4 0 C 3 735G (SUTURE) IMPLANT
SUT VICRYL 6 0 S 28 (SUTURE) IMPLANT
SUT VICRYL ABS 6-0 S29 18IN (SUTURE) ×6 IMPLANT
SYRINGE 10CC LL (SYRINGE) ×3 IMPLANT
TOWEL OR 17X24 6PK STRL BLUE (TOWEL DISPOSABLE) ×3 IMPLANT
TOWEL OR NON WOVEN STRL DISP B (DISPOSABLE) ×3 IMPLANT
TRAY DSU PREP LF (CUSTOM PROCEDURE TRAY) ×3 IMPLANT

## 2014-03-01 NOTE — H&P (View-Only) (Signed)
  Date of examination:  02-21-14   Indication for surgery: to straighten the eyes and allow some binocularity  Pertinent past medical history:  Past Medical History  Diagnosis Date  . Neurofibromatosis, type 1 (von Recklinghausen's disease)   . Headache(784.0)     stress or migraine  . Hypothyroidism   . Asthma     prn inhaler  . Esotropia of both eyes 02/2014  . History of cardiac murmur     "no murmur" per PCP 11/07/2013  . 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  1998 RSR recess 6.0 mm  Pertinent family history:  Family History  Problem Relation Age of Onset  . Diabetes Maternal Grandmother     General:  Healthy appearing patient in no distress.    Eyes:    Acuity cc   OD 20/20  OS 20/20  External: Within normal limits x lower eyelid asymmetry (more inf scleral show OD)    Anterior segment: Healed conj scars inf OS and sup OD.  Lisch nodules OU  Motility:   ET = 15 in primary and L gaze, 6 in R gaze. + "A".  ET' = 20.  RHT = 8 + 10 DVD = 18.  horiz comitant.  2- LSR  Fundus: Normal     Refraction:  Cycloplegic   OD -4 approx  OS -2 approx  Heart: Regular rate and rhythm without murmur     Lungs: Clear to auscultation     Abdomen: Soft, nontender, normal bowel sounds     Impression:RHT residual/recurrent, s/p RSR recess for RHT following L inf orbitotomy.  Note limitation of elevation of OS. LIR  FD reportedly negative at time of initial strabismus surgery  Plan: Forced ductions to assess restriction to elevation of OS.  IF tight, consider LIR recess/explore/free restrictions.  If free, re-recess RSR, taking care to avoid disrupting RSO insertion  Loretta [name] 

## 2014-03-01 NOTE — Discharge Instructions (Signed)
Diet: Clear liquids, advance to soft foods then regular diet as tolerated.  Pain control:   1)  Ibuprofen 600 mg by mouth every 6-8 hours as needed for pain  2)  Percocet 7.5/325 one or two by mouth as needed every 4-6 hours as needed for pain that is not resolved by ibuprofen  Eye medications: None  Activity: No swimming for 1 week.  It is OK to let water run over the face and eyes while showering or taking a bath, even during the first week.  No other restriction on exercise or activity.  Call Dr. Roxy Cedar office [phone] with any problems or concerns.    Post Anesthesia Home Care Instructions  Activity: Get plenty of rest for the remainder of the day. A responsible adult should stay with you for 24 hours following the procedure.  For the next 24 hours, DO NOT: -Drive a car -Advertising copywriter -Drink alcoholic beverages -Take any medication unless instructed by your physician -Make any legal decisions or sign important papers.  Meals: Start with liquid foods such as gelatin or soup. Progress to regular foods as tolerated. Avoid greasy, spicy, heavy foods. If nausea and/or vomiting occur, drink only clear liquids until the nausea and/or vomiting subsides. Call your physician if vomiting continues.  Special Instructions/Symptoms: Your throat may feel dry or sore from the anesthesia or the breathing tube placed in your throat during surgery. If this causes discomfort, gargle with warm salt water. The discomfort should disappear within 24 hours.

## 2014-03-01 NOTE — Anesthesia Preprocedure Evaluation (Signed)
Anesthesia Evaluation  Patient identified by MRN, date of birth, ID band Patient awake    Reviewed: Allergy & Precautions, H&P , NPO status , Patient's Chart, lab work & pertinent test results  Airway Mallampati: I TM Distance: >3 FB Neck ROM: full    Dental  (+) Teeth Intact, Dental Advidsory Given   Pulmonary asthma ,  breath sounds clear to auscultation        Cardiovascular negative cardio ROS  Rhythm:regular Rate:Normal     Neuro/Psych  Headaches, negative psych ROS   GI/Hepatic negative GI ROS, Neg liver ROS,   Endo/Other  Hypothyroidism   Renal/GU negative Renal ROS     Musculoskeletal   Abdominal   Peds  Hematology   Anesthesia Other Findings   Reproductive/Obstetrics negative OB ROS                           Anesthesia Physical Anesthesia Plan  ASA: II  Anesthesia Plan: General LMA   Post-op Pain Management:    Induction:   Airway Management Planned:   Additional Equipment:   Intra-op Plan:   Post-operative Plan:   Informed Consent: I have reviewed the patients History and Physical, chart, labs and discussed the procedure including the risks, benefits and alternatives for the proposed anesthesia with the patient or authorized representative who has indicated his/her understanding and acceptance.   Dental Advisory Given  Plan Discussed with: Anesthesiologist, CRNA and Surgeon  Anesthesia Plan Comments:         Anesthesia Quick Evaluation

## 2014-03-01 NOTE — Interval H&P Note (Signed)
History and Physical Interval Note:  03/01/2014 8:08 AM  Loretta [name]  has presented today for surgery, with the diagnosis of ESOTROPIA  The various methods of treatment have been discussed with the patient and family. After consideration of risks, benefits and other options for treatment, the patient has consented to  Procedure(s): REPAIR STRABISMUS BILATERAL (Bilateral) as a surgical intervention .  The patient's history has been reviewed, patient examined, no change in status, stable for surgery.  I have reviewed the patient's chart and labs.  Questions were answered to the patient's satisfaction.     Shara Blazing

## 2014-03-01 NOTE — Transfer of Care (Signed)
Immediate Anesthesia Transfer of Care Note  Patient: Loretta [name]  Procedure(s) Performed: Procedure(s): REPAIR STRABISMUS BILATERAL (Right)  Patient Location: PACU  Anesthesia Type:General  Level of Consciousness: awake and sedated  Airway & Oxygen Therapy: Patient Spontanous Breathing and Patient connected to face mask oxygen  Post-op Assessment: Report given to PACU RN and Post -op Vital signs reviewed and stable  Post vital signs: Reviewed and stable  Complications: No apparent anesthesia complications

## 2014-03-01 NOTE — Anesthesia Postprocedure Evaluation (Signed)
Anesthesia Post Note  Patient: Loretta [name]  Procedure(s) Performed: Procedure(s) (LRB): REPAIR STRABISMUS BILATERAL (Right)  Anesthesia type: general  Patient location: PACU  Post pain: Pain level controlled  Post assessment: Patient's Cardiovascular Status Stable  Last Vitals:  Filed Vitals:   03/01/14 1030  BP: 117/61  Pulse: 78  Temp:   Resp: 13    Post vital signs: Reviewed and stable  Level of consciousness: sedated  Complications: No apparent anesthesia complications

## 2014-03-01 NOTE — Anesthesia Procedure Notes (Signed)
Procedure Name: LMA Insertion Performed by: Tikesha Mort W Pre-anesthesia Checklist: Patient identified, Timeout performed, Emergency Drugs available, Suction available and Patient being monitored Patient Re-evaluated:Patient Re-evaluated prior to inductionOxygen Delivery Method: Circle system utilized Preoxygenation: Pre-oxygenation with 100% oxygen Intubation Type: IV induction Ventilation: Mask ventilation without difficulty LMA: LMA flexible inserted LMA Size: 4.0 Number of attempts: 1 Placement Confirmation: breath sounds checked- equal and bilateral and positive ETCO2 Tube secured with: Tape Dental Injury: Teeth and Oropharynx as per pre-operative assessment      

## 2014-03-01 NOTE — Op Note (Signed)
03/01/2014  9:50 AM  PATIENT:  Loretta [name]    PRE-OPERATIVE DIAGNOSIS:  1. Right hypertropia, residual/recurrent s/p RSR recess      2.Esotropia      3. Neurofibromatosis, s/p excision of left inferior orbital mass  POST-OPERATIVE DIAGNOSIS:  Same  PROCEDURE:  1.  Right superior rectus muscle re-recession, 3.5 mm   2. Right medial rectus muscle recession, 5.5 mm  SURGEON:  Shara Blazing, MD  ANESTHESIA:   General  PREOPERATIVE INDICATIONS:  Loretta [name] is a  23 y.o. female with a diagnosis of right hypertropia and Esotropia who failed conservative measures and elected for surgical management.    The risks benefits and alternatives were discussed with the patient preoperatively including but not limited to the risks of infection, bleeding,  cardiopulmonary complications, the need for revision surgery, among others, and the patient was willing to proceed.   OPERATIVE PROCEDURE: The patient was taken to the operating room where she was identified by me. General anesthesia was induced without difficulty after placement of appropriate monitors. The patient was prepped and draped in standard sterile fashion. A lid speculum was placed in the left eye. No significant restriction was found to forced elevation of the left eye, though conjunctiva scarring from the previous anterior orbitotomy was evident.  It was therefore elected to address the vertical misalignment by repeat recessing the right superior rectus muscle and status recessing the left inferior rectus muscle. The lid speculum was transferred to the right eye.  Through a superonasal fornix incision through conjunctiva and Tenon fascia, the right superior rectus muscle was engaged on a series of muscle hooks. It was initially found that the superior oblique tendon had been engaged together with the recessed superior rectus muscle; the superior oblique tendon was carefully separated from the superior rectus muscle and retained with a  traction suture, leaving only the superior rectus muscle on the muscle hook. The muscle was found inserted approximately 13 mm posterior to the limbus. The tendon was secured with a double-armed 6-0 Vicryl suture, a double locking bite at each border of the muscle, 1 mm from the insertion. The muscle was disinserted, and was reattached to sclera at a measured distance of 3.5 mm posterior to the current insertion, using direct scleral passes in crossed swords fashion. The suture ends were tied securely after the position of the muscle been checked and found to be accurate. The traction suture was removed from the superior oblique tendon, which was again verified to be intact. Conjunctiva was closed with 2 6-0 plain gut sutures.  Through an inferonasal fornix incision through conjunctiva and Tenon fascia, the right medial rectus muscle was engaged on a series of muscle hooks and cleared of its fascial attachments. The tendon was secured with a double-armed 6-0 Vicryl suture, a double locking bite at each border of the muscle, 1 mm from the insertion. The muscle was disinserted, and was reattached to sclera at a measured distance of 5.5 mm posterior to the on operated insertion, using direct scleral passes in crossed swords fashion. The suture ends were tied securely after the position of the muscle been checked and found to be accurate. Conjunctiva was closed with 2 6-0 plain gut sutures. A drop of 5% Betadine solution was placed in the eye, followed by TobraDex ointment. The patient was awakened without difficulty and taken to the recovery room in stable condition having suffered no intraoperative or immediate postoperative complications

## 2014-03-04 ENCOUNTER — Encounter (HOSPITAL_BASED_OUTPATIENT_CLINIC_OR_DEPARTMENT_OTHER): Payer: Self-pay | Admitting: Ophthalmology

## 2014-03-06 ENCOUNTER — Ambulatory Visit: Payer: BC Managed Care – PPO | Admitting: Nurse Practitioner

## 2014-03-07 ENCOUNTER — Ambulatory Visit (INDEPENDENT_AMBULATORY_CARE_PROVIDER_SITE_OTHER): Payer: BC Managed Care – PPO | Admitting: Nurse Practitioner

## 2014-03-07 ENCOUNTER — Encounter: Payer: Self-pay | Admitting: Nurse Practitioner

## 2014-03-07 VITALS — BP 123/58 | HR 90 | Ht 61.0 in | Wt 146.0 lb

## 2014-03-07 DIAGNOSIS — Q8501 Neurofibromatosis, type 1: Secondary | ICD-10-CM

## 2014-03-07 NOTE — Progress Notes (Signed)
[facility]  PATIENT: Loretta [name] DOB: [birth year]   REASON FOR VISIT: Followup for neurofibromatosis type I   HISTORY OF PRESENT ILLNESS:[name], 23 year old female returns for followup. She was last seen by Dr. Terrace ArabiaYan 02/28/2013 for surgical clearance for elective strabismus corrective surgery. She just had surgery last week on the right eye. MRI of the brain performed after her last visit was normal, she was given a copy today. She has no new neurologic complaints she returns for reevaluation   HISTORY: Loretta [name] is a 23 years old Hispanic female, alone at today's clinical visit, she is referred by her primary care physician Dr. Eartha InchVapne and ophthalmologist Dr. Maple HudsonYoung for neurological evaluation prior to her elective strabismus correction surgery, I first evaluated her in Jan 2013 for the same reason she has lost followup, she missed her elective surgical time as well.  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 She was also noticed to have multiple freckles, especially in her axillary region, inguinal region, she also has eyelid, and skull underneath skin growth, consistent with neurofibromas.  She was born with a strabismus, had correction surgery when she was 23 years old, she is now looking forward to another correction surgery of her strabismus by Dr. Verne CarrowWilliam Young. She is referred to have neurological consultation to rule out central nervous system involvement from neurofibromatosis1 before proceeding with the procedure..  She denies visual loss, there was no lateralized motor or sensory deficit, she denies learning disability, no history of seizure, cardiac complaints, bone abnormalities.  Her mother has multiple skin freckles, also has subcutaneous nodules.   REVIEW OF SYSTEMS: Full 14 system review of systems  performed and notable only for those listed, all others are neg:  Constitutional: N/A  Cardiovascular: N/A  Ear/Nose/Throat: N/A  Skin: N/A  Eyes: N/A  Respiratory: N/A  Gastroitestinal: N/A  Hematology/Lymphatic: N/A  Endocrine: N/A Musculoskeletal:N/A  Allergy/Immunology: N/A  Neurological: N/A Psychiatric: N/A Sleep : NA   ALLERGIES: No Known Allergies  HOME MEDICATIONS: Outpatient Prescriptions Prior to Visit  Medication Sig Dispense Refill  . albuterol (PROVENTIL HFA;VENTOLIN HFA) 108 (90 BASE) MCG/ACT inhaler Inhale into the lungs every 6 (six) hours as needed for wheezing or shortness of breath.      . levothyroxine (SYNTHROID, LEVOTHROID) 50 MCG tablet TAKE 1 TABLET EVERY DAY  30 tablet  11  . oxyCODONE-acetaminophen (PERCOCET) 7.5-325 MG per tablet Take 1 tablet by mouth every 4 (four) hours as needed for pain.  10 tablet  0   No facility-administered medications prior to visit.    PAST MEDICAL HISTORY: Past Medical History  Diagnosis Date  . Neurofibromatosis, type 1 (von Recklinghausen's disease)   . Headache(784.0)     stress or migraine  . Hypothyroidism   . Asthma     prn inhaler  . Esotropia of both eyes 02/2014  . History of cardiac murmur     "no murmur" per PCP 11/07/2013  . Irregular periods     did have menses 01/2014    PAST SURGICAL HISTORY: Past Surgical History  Procedure Laterality Date  . Strabismus surgery      age 23  . Tumor excision Right as an infant    lower eyelid  . Strabismus surgery Right 03/01/2014    Procedure: REPAIR STRABISMUS BILATERAL;  Surgeon: Shara BlazingWilliam O Young, MD;  Location: [facility];  Service: Ophthalmology;  Laterality: Right;    FAMILY HISTORY: Family History  Problem Relation Age of Onset  . Diabetes Maternal Grandmother     SOCIAL HISTORY: History   Social History  . Marital Status: Single    Spouse Name: N/A    Number of Children: 0  . Years of Education: College   Occupational  History  . Sales     [employer]   Social History Main Topics  . Smoking status: Never Smoker   . Smokeless tobacco: Never Used  . Alcohol Use: Yes     Comment: rarely  . Drug Use: No  . Sexual Activity: No   Other Topics Concern  . Not on file   Social History Narrative   Patient lives at home with her parents.    Patient works at [employer].     Caffeine one cup daily.    Left handed.    To finish college, [employer], in about a year.     PHYSICAL EXAM  Filed Vitals:   03/07/14 1451  BP: 123/58  Pulse: 90  Height: 5\' 1"  (1.549 m)  Weight: 146 lb (66.225 kg)   Body mass index is 27.6 kg/(m^2).  Generalized: Well developed, in no acute distress  Head: normocephalic and atraumatic,. Oropharynx benign  Neck: supple no carotid bruits  Respiratory: clear to auscultation bilaterally  Cardiovascular: regular rate rhythm  Skin: Multiple cafe au lait macules, especially at bilateral axilla region, she also had few subcutaneous nodules, most likely neurofibroma, multiple cafe au lait macules, especially at bilateral axilla region, she also had few subcutaneous nodules, most likely neurofibroma at right supraorbital, occipital skull region  Neurologic Exam  Mental Status: pleasant, awake, alert, cooperative to history, talking, and casual conversation.  Cranial Nerves: CN II-XII pupils were equal round reactive to light. Fundi were sharp bilaterally. Extraocular movements were full.  Visual fields were full on confrontational test. Facial sensation and strength were normal. Hearing was intact to finger rubbing bilaterally. Uvula tongue were midline. Head turning and shoulder shrugging were normal and symmetric. Tongue protrusion into the cheeks strength were normal.  Motor: Normal tone, bulk, and strength.  Sensory: Normal to light touch, pinprick, proprioception, and vibratory sensation.  Coordination: Normal finger-to-nose, heel-to-shin. There was no dysmetria noticed.    Gait and Station: Narrow based and steady, was able to perform tiptoe, heel, and tandem walking without difficulty. Romberg sign: Negative  Reflexes: Deep tendon reflexes: Biceps: 2/2, Brachioradialis: 2/2, Triceps: 2/2, Pateller: 2/2, Achilles: 2/2. Plantar responses are flexor. Cardiac: Regular rate rhythm, no murmur  Musculoskeletal: No deformity     DIAGNOSTIC DATA (LABS, IMAGING, TESTING) -  Lab Results  Component Value Date   TSH 3.92 11/07/2013    ASSESSMENT AND PLAN  23 y.o. year old female  has a past medical history of Neurofibromatosis, type 1 (von Recklinghausen's disease); recent elective strabismus corrective surgery on the right. MRI of the brain after her last visit normal. Patient was given a copy   Followup when necessary Nilda Riggs, [facility], [facility], APRN  [facility] [address], [address] [place], [place] [address] [phone]

## 2014-03-07 NOTE — Patient Instructions (Addendum)
Copy of MRI of the brain F/U prn

## 2014-04-19 ENCOUNTER — Encounter: Payer: BC Managed Care – PPO | Admitting: Women's Health

## 2014-04-25 LAB — HM DIABETES EYE EXAM

## 2014-04-29 ENCOUNTER — Encounter: Payer: Self-pay | Admitting: Internal Medicine

## 2014-05-21 ENCOUNTER — Other Ambulatory Visit: Payer: Self-pay | Admitting: Gynecology

## 2014-05-24 ENCOUNTER — Encounter: Payer: BC Managed Care – PPO | Admitting: Women's Health

## 2014-06-14 ENCOUNTER — Encounter: Payer: Self-pay | Admitting: Women's Health

## 2014-06-14 ENCOUNTER — Ambulatory Visit (INDEPENDENT_AMBULATORY_CARE_PROVIDER_SITE_OTHER): Payer: BC Managed Care – PPO | Admitting: Women's Health

## 2014-06-14 VITALS — BP 110/60 | Ht 61.0 in | Wt 146.0 lb

## 2014-06-14 DIAGNOSIS — E039 Hypothyroidism, unspecified: Secondary | ICD-10-CM

## 2014-06-14 DIAGNOSIS — Z01419 Encounter for gynecological examination (general) (routine) without abnormal findings: Secondary | ICD-10-CM

## 2014-06-14 LAB — CBC WITH DIFFERENTIAL/PLATELET
BASOS ABS: 0.1 10*3/uL (ref 0.0–0.1)
Basophils Relative: 1 % (ref 0–1)
EOS PCT: 1 % (ref 0–5)
Eosinophils Absolute: 0.1 10*3/uL (ref 0.0–0.7)
HCT: 33.2 % — ABNORMAL LOW (ref 36.0–46.0)
Hemoglobin: 11.1 g/dL — ABNORMAL LOW (ref 12.0–15.0)
LYMPHS PCT: 28 % (ref 12–46)
Lymphs Abs: 2 10*3/uL (ref 0.7–4.0)
MCH: 26.4 pg (ref 26.0–34.0)
MCHC: 33.4 g/dL (ref 30.0–36.0)
MCV: 79 fL (ref 78.0–100.0)
Monocytes Absolute: 0.7 10*3/uL (ref 0.1–1.0)
Monocytes Relative: 10 % (ref 3–12)
NEUTROS PCT: 60 % (ref 43–77)
Neutro Abs: 4.2 10*3/uL (ref 1.7–7.7)
PLATELETS: 400 10*3/uL (ref 150–400)
RBC: 4.2 MIL/uL (ref 3.87–5.11)
RDW: 15.8 % — AB (ref 11.5–15.5)
WBC: 7 10*3/uL (ref 4.0–10.5)

## 2014-06-14 MED ORDER — LEVOTHYROXINE SODIUM 50 MCG PO TABS: ORAL_TABLET | ORAL | Status: DC

## 2014-06-14 NOTE — Progress Notes (Signed)
Loretta [name] [birth year] [id number]    History:    Presents for annual exam.  Hypothyroidism- Synthroid 50 mcg daily. See opthalmology and neurology for management of neurofibromatosis.  Normal pap smear 2014.  Regular monthly menstrual cycles/virgin. Gardasil series completed 2011   Past medical history, past surgical history, family history and social history were all reviewed and documented in the EPIC chart.  [employer] student- recreational therapy.  ROS:  A  12 point ROS was performed and pertinent positives and negatives are included.  Exam:  Filed Vitals:   06/14/14 1030  BP: 110/60    General appearance:  Normal Thyroid:  Symmetrical, normal in size, without palpable masses or nodularity. Respiratory  Auscultation:  Clear without wheezing or rhonchi Cardiovascular  Auscultation:  Regular rate, without rubs, murmurs or gallops  Edema/varicosities:  Not grossly evident Abdominal  Soft,nontender, without masses, guarding or rebound.  Liver/spleen:  No organomegaly noted  Hernia:  None appreciated  Skin  Inspection:  Grossly normal   Breasts: Examined lying and sitting.     Right: Without masses, retractions, discharge or axillary adenopathy.     Left: Without masses, retractions, discharge or axillary adenopathy. Gentitourinary   Inguinal/mons:  Normal without inguinal adenopathy  External genitalia:  Normal  BUS/Urethra/Skene's glands:  Normal  Vagina:  Normal  Cervix:  Normal- exam difficult due to tenseness   Uterus:  Normal in size, shape and contour.  Midline and mobile  Adnexa/parametria:     Rt: Without masses or tenderness.   Lt: Without masses or tenderness.  Anus and perineum: Normal  Digital rectal exam: Normal sphincter tone without palpated masses or tenderness  Assessment/Plan:  23 y.o.  SHF virgin for annual exam.     Neurofibromatosis- Managed by opthalmology and neurologist Hypothyroidism- Synthroid Normal GYN exam- Virgin  Plan:   Contraception discussed, declines.  Condoms encouraged if sexually active.  Normal pap 2014.  Exam difficult due to tenseness of patient.  SBEs, MVI, calcium rich diet encouraged.  Campus safety discussed.  CBC, TSH, UA,. Synthroid 50 mcg by mouth daily prescription, proper use given and reviewed.   Declines vaccine.      Harrington Challenger WHNP, 11:06 AM 06/14/2014

## 2014-06-14 NOTE — Patient Instructions (Signed)

## 2014-06-15 LAB — TSH: TSH: 3.577 u[IU]/mL (ref 0.350–4.500)

## 2014-08-13 ENCOUNTER — Encounter: Payer: Self-pay | Admitting: Women's Health

## 2014-08-13 ENCOUNTER — Ambulatory Visit (INDEPENDENT_AMBULATORY_CARE_PROVIDER_SITE_OTHER): Payer: BC Managed Care – PPO | Admitting: Women's Health

## 2014-08-13 VITALS — BP 120/70 | Ht 61.0 in | Wt 152.0 lb

## 2014-08-13 DIAGNOSIS — B3731 Acute candidiasis of vulva and vagina: Secondary | ICD-10-CM

## 2014-08-13 DIAGNOSIS — B373 Candidiasis of vulva and vagina: Secondary | ICD-10-CM

## 2014-08-13 DIAGNOSIS — L02412 Cutaneous abscess of left axilla: Secondary | ICD-10-CM

## 2014-08-13 MED ORDER — FLUCONAZOLE 150 MG PO TABS: 150.0000 mg | ORAL_TABLET | Freq: Once | ORAL | Status: DC

## 2014-08-13 MED ORDER — CEPHALEXIN 500 MG PO CAPS: 500.0000 mg | ORAL_CAPSULE | Freq: Four times a day (QID) | ORAL | Status: DC

## 2014-08-13 NOTE — Progress Notes (Signed)
Patient ID: Loretta [name], female   DOB: [birth year], 23 y.o.   MRN: [id number] presents with a 3 day history of a left axilla painful lump. No change in breast exam. Monthly cycle/Virgin. Denies a fever.  Exam: Appears well. Left axilla 3 cm nonindurated tender abscess.  Left axilla abscess  Plan: Options reviewed, reviewed could anesthetize and open or antibiotics. Prefers antibiotics, Keflex 500 4 times daily for 1 week, Diflucan 150 by mouth 1 dose if needed. Instructed to call if area does not resolve. Reviewed may open and drain or absorb. Instructed to keep clean and dry, avoid antiperspirants. Warm compresses.

## 2014-08-13 NOTE — Patient Instructions (Signed)

## 2014-10-04 ENCOUNTER — Ambulatory Visit (INDEPENDENT_AMBULATORY_CARE_PROVIDER_SITE_OTHER): Payer: BLUE CROSS/BLUE SHIELD | Admitting: Internal Medicine

## 2014-10-04 ENCOUNTER — Encounter: Payer: Self-pay | Admitting: Internal Medicine

## 2014-10-04 VITALS — BP 113/72 | HR 101 | Temp 98.1°F | Wt 152.2 lb

## 2014-10-04 DIAGNOSIS — J069 Acute upper respiratory infection, unspecified: Secondary | ICD-10-CM

## 2014-10-04 NOTE — Patient Instructions (Signed)
Rest, fluids , tylenol If  cough, take Mucinex DM twice a day as needed  For nasal  congestion use OTC Nasocort or Flonase : 2 nasal sprays on each side of the nose daily until you feel better Get pseudoephedrine 30 mg (behind the counter, you need to talk with the pharmacist) take one tablet 3 or 4 times a day as needed for congestion  Call if not gradually better over the next  10 days Call anytime if the symptoms are severe

## 2014-10-04 NOTE — Progress Notes (Signed)
Pre visit review using our clinic review tool, if applicable. No additional management support is needed unless otherwise documented below in the visit note. 

## 2014-10-04 NOTE — Progress Notes (Signed)
Subjective:    Patient ID: Loretta SachsLisette [name], female    DOB: 1991-06-30, 24 y.o.   MRN: [id number]  DOS:  10/04/2014 Type of visit - description : Acute visit Interval history: Symptoms started  few days ago with sneezing, cough, nasal congestion, blowing small amount of discharge from the nose, color?Marland Kitchen. Has not taken any medication.    ROS Denies fever chills or sore throat No chest pain or difficulty breathing She has a history of asthma, yesterday felt a slightly wheezy for few minutes but did not require to use inhaler. Symptoms self resolved  Past Medical History  Diagnosis Date  . Neurofibromatosis, type 1 (von Recklinghausen's disease)   . Headache(784.0)     stress or migraine  . Hypothyroidism   . Asthma     prn inhaler  . Esotropia of both eyes 02/2014  . History of cardiac murmur     "no murmur" per PCP 11/07/2013  . Irregular periods     did have menses 01/2014    Past Surgical History  Procedure Laterality Date  . Strabismus surgery      age 24  . Tumor excision Right as an infant    lower eyelid  . Strabismus surgery Right 03/01/2014    Procedure: REPAIR STRABISMUS BILATERAL;  Surgeon: Shara BlazingWilliam O Young, MD;  Location: [facility];  Service: Ophthalmology;  Laterality: Right;    History   Social History  . Marital Status: Single    Spouse Name: N/A    Number of Children: 0  . Years of Education: College   Occupational History  . Sales     [employer]   Social History Main Topics  . Smoking status: Never Smoker   . Smokeless tobacco: Never Used  . Alcohol Use: Yes     Comment: rarely  . Drug Use: No  . Sexual Activity: No   Other Topics Concern  . Not on file   Social History Narrative   Patient lives at home with her parents.    Caffeine one cup daily.    Left handed.    Nancy FetterFinish [employer] [id number]        Medication List       This list is accurate as of: 10/04/14  4:46 PM.  Always use your most recent med list.             albuterol 108 (90 BASE) MCG/ACT inhaler  Commonly known as:  PROVENTIL HFA;VENTOLIN HFA  Inhale into the lungs every 6 (six) hours as needed for wheezing or shortness of breath.     levothyroxine 50 MCG tablet  Commonly known as:  SYNTHROID, LEVOTHROID  TAKE 1 TABLET EVERY DAY           Objective:   Physical Exam BP 113/72 mmHg  Pulse 101  Temp(Src) 98.1 F (36.7 C) (Oral)  Wt 152 lb 4 oz (69.06 kg)  SpO2 96%  LMP 09/30/2014 General -- alert, well-developed, NAD.  HEENT-- Not pale.  R Ear-- normal L ear-- normal Throat symmetric, no redness or discharge. Face symmetric, sinuses not tender to palpation. Nose slt congested.  Lungs -- normal respiratory effort, no intercostal retractions, no accessory muscle use, and normal breath sounds.  Heart-- normal rate, regular rhythm, no murmur.  Extremities-- no pretibial edema bilaterally  Neurologic--  alert & oriented X3. Speech normal, gait appropriate for age, strength symmetric and appropriate for age.   Psych-- Cognition and judgment appear intact. Cooperative with normal attention span  and concentration. No anxious or depressed appearing. \      Assessment & Plan:   URI, See  instructions. She has asthma, fortunately that has not been exacerbated. If not better consider antibiotics

## 2014-11-26 DIAGNOSIS — S83519A Sprain of anterior cruciate ligament of unspecified knee, initial encounter: Secondary | ICD-10-CM

## 2014-11-26 HISTORY — DX: Sprain of anterior cruciate ligament of unspecified knee, initial encounter: S83.519A

## 2014-12-09 ENCOUNTER — Other Ambulatory Visit: Payer: Self-pay | Admitting: Orthopedic Surgery

## 2014-12-09 ENCOUNTER — Encounter (HOSPITAL_BASED_OUTPATIENT_CLINIC_OR_DEPARTMENT_OTHER): Payer: Self-pay | Admitting: *Deleted

## 2014-12-10 ENCOUNTER — Ambulatory Visit: Payer: BLUE CROSS/BLUE SHIELD | Admitting: Licensed Clinical Social Worker

## 2014-12-16 ENCOUNTER — Ambulatory Visit (HOSPITAL_BASED_OUTPATIENT_CLINIC_OR_DEPARTMENT_OTHER)
Admission: RE | Admit: 2014-12-16 | Payer: BLUE CROSS/BLUE SHIELD | Source: Ambulatory Visit | Admitting: Orthopedic Surgery

## 2014-12-16 HISTORY — DX: Personal history of other diseases of the nervous system and sense organs: Z86.69

## 2014-12-16 HISTORY — DX: Sprain of anterior cruciate ligament of unspecified knee, initial encounter: S83.519A

## 2014-12-16 HISTORY — DX: Other asthma: J45.998

## 2014-12-16 SURGERY — KNEE ARTHROSCOPY WITH ANTERIOR CRUCIATE LIGAMENT (ACL) REPAIR
Anesthesia: Choice | Site: Knee | Laterality: Left

## 2015-01-09 ENCOUNTER — Encounter: Payer: Self-pay | Admitting: *Deleted

## 2015-01-09 ENCOUNTER — Telehealth: Payer: Self-pay | Admitting: *Deleted

## 2015-01-09 NOTE — Telephone Encounter (Signed)
Pre visit info completed.  

## 2015-01-09 NOTE — Telephone Encounter (Signed)
Pre visit completed w/ pt under specialty comments.

## 2015-01-10 ENCOUNTER — Ambulatory Visit (INDEPENDENT_AMBULATORY_CARE_PROVIDER_SITE_OTHER): Payer: BLUE CROSS/BLUE SHIELD | Admitting: Internal Medicine

## 2015-01-10 ENCOUNTER — Encounter: Payer: Self-pay | Admitting: Internal Medicine

## 2015-01-10 VITALS — BP 118/56 | HR 86 | Temp 98.1°F | Ht 61.0 in | Wt 154.2 lb

## 2015-01-10 DIAGNOSIS — E039 Hypothyroidism, unspecified: Secondary | ICD-10-CM

## 2015-01-10 DIAGNOSIS — Z0000 Encounter for general adult medical examination without abnormal findings: Secondary | ICD-10-CM | POA: Diagnosis not present

## 2015-01-10 LAB — COMPREHENSIVE METABOLIC PANEL
ALBUMIN: 4.6 g/dL (ref 3.5–5.2)
ALK PHOS: 49 U/L (ref 39–117)
ALT: 11 U/L (ref 0–35)
AST: 13 U/L (ref 0–37)
BUN: 14 mg/dL (ref 6–23)
CHLORIDE: 102 meq/L (ref 96–112)
CO2: 22 meq/L (ref 19–32)
CREATININE: 0.67 mg/dL (ref 0.50–1.10)
Calcium: 9.7 mg/dL (ref 8.4–10.5)
Glucose, Bld: 83 mg/dL (ref 70–99)
POTASSIUM: 3.8 meq/L (ref 3.5–5.3)
Sodium: 134 mEq/L — ABNORMAL LOW (ref 135–145)
TOTAL PROTEIN: 7.4 g/dL (ref 6.0–8.3)
Total Bilirubin: 0.3 mg/dL (ref 0.2–1.2)

## 2015-01-10 LAB — TSH: TSH: 11.435 u[IU]/mL — AB (ref 0.350–4.500)

## 2015-01-10 MED ORDER — LEVOTHYROXINE SODIUM 50 MCG PO TABS: 50.0000 ug | ORAL_TABLET | Freq: Every day | ORAL | Status: DC

## 2015-01-10 NOTE — Progress Notes (Signed)
Pre visit review using our clinic review tool, if applicable. No additional management support is needed unless otherwise documented below in the visit note. 

## 2015-01-10 NOTE — Patient Instructions (Signed)
Get your blood work before you leave    Restart your thyroid medication You will need labs again in 6-8 weeks, please be sure to call the office and make an appointment   Come back to the office in 1 year for a physical exam  Please schedule an appointment at the front desk    Come back fasting

## 2015-01-10 NOTE — Assessment & Plan Note (Addendum)
Minimal thyromegaly ? ---> Reassess yearly. Run out of medications 3 months ago, recommend compliance, refill sent. Send a TSH today, come back in 6 weeks for another TSH. Addendum states she won't be in town, then come back in 8 weeks at most.

## 2015-01-10 NOTE — Progress Notes (Signed)
Subjective:    Patient ID: Loretta [name], female    DOB: 01-23-1991, 24 y.o.   MRN: [id number]  DOS:  01/10/2015 Type of visit - description : cpx Interval history: In general feeling well, does have a knee injury and requires surgery which is pending at this point. She ran out of thyroid medication 3 months ago.   Review of Systems Constitutional: No fever, chills. No unexplained wt changes. No unusual sweats HEENT: No dental problems, ear discharge, facial swelling, voice changes. No eye discharge, redness or intolerance to light Respiratory: No wheezing or difficulty breathing. No cough , mucus production Cardiovascular: No CP, leg swelling or palpitations GI: no nausea, vomiting, diarrhea or abdominal pain.  No blood in the stools. No dysphagia   Endocrine: No polyphagia, polyuria or polydipsia GU: No dysuria, gross hematuria, difficulty urinating. No urinary urgency or frequency. Musculoskeletal: No joint swellings or unusual aches or pains Skin: No change in the color of the skin, palor or rash Allergic, immunologic: No environmental allergies or food allergies Neurological: No dizziness or syncope. No headaches. No diplopia, slurred speech, motor deficits, facial numbness Hematological: No enlarged lymph nodes, easy bruising or bleeding Psychiatry: No suicidal ideas, hallucinations, behavior problems or confusion. No unusual/severe anxiety or depression.     Past Medical History  Diagnosis Date  . Neurofibromatosis, type 1 (von Recklinghausen's disease)   . History of cardiac murmur     "innocent murmur" 2011 - states has resolved  . Headache(784.0)     stress/migraine  . Hypothyroidism     has not taken medication since 09/2014  . Seasonal asthma     prn inhaler  . History of strabismus   . ACL tear 11/2014    left  . ACL (anterior cruciate ligament) tear   . Asthma 03/27/2012    Past Surgical History  Procedure Laterality Date  . Strabismus surgery      age 89   . Tumor excision Right as an infant    lower eyelid  . Strabismus surgery Right 03/01/2014    Procedure: REPAIR STRABISMUS BILATERAL;  Surgeon: Shara BlazingWilliam O Young, MD;  Location: [facility];  Service: Ophthalmology;  Laterality: Right;  . Anterior cruciate ligament repair Left scheduled April 2016    Hamstring graft     History   Social History  . Marital Status: Single    Spouse Name: N/A  . Number of Children: 0  . Years of Education: College   Occupational History  . not working at present    Social History Main Topics  . Smoking status: Never Smoker   . Smokeless tobacco: Never Used  . Alcohol Use: Yes     Comment: rarely  . Drug Use: No  . Sexual Activity: No   Other Topics Concern  . Not on file   Social History Narrative   G0P0   Patient lives at home with her parents.    Left handed.    Nancy [employer] 01-2014     Family History  Problem Relation Age of Onset  . Diabetes Maternal Grandmother   . Colon cancer Neg Hx   . Breast cancer Neg Hx        Medication List       This list is accurate as of: 01/10/15 11:59 PM.  Always use your most recent med list.               ascorbic acid 500 MG tablet  Commonly known as:  VITAMIN  C  Take 500 mg by mouth daily.     calcium carbonate 600 MG Tabs tablet  Commonly known as:  OS-CAL  Take 600 mg by mouth 2 (two) times daily with a meal.     FISH OIL PO  Take by mouth.     ibuprofen 800 MG tablet  Commonly known as:  ADVIL,MOTRIN  Take 800 mg by mouth every 8 (eight) hours as needed.     levothyroxine 50 MCG tablet  Commonly known as:  SYNTHROID, LEVOTHROID  Take 1 tablet (50 mcg total) by mouth daily before breakfast.     OVER THE COUNTER MEDICATION  1 tablet daily. Magnolia Bark-Vegetarian           Objective:   Physical Exam BP 118/56 mmHg  Pulse 86  Temp(Src) 98.1 F (36.7 C) (Oral)  Ht  (1.549 m)  Wt 154 lb 4 oz (69.967 kg)  BMI 29.16 kg/m2  SpO2 99%  LMP 12/23/2014  (Approximate)  General:   Well developed, well nourished . NAD.  Neck:  Full range of motion. Supple. minimal  thyromegaly ? Gland not nodular or tender HEENT:  Normocephalic . Face symmetric, atraumatic Lungs:  CTA B Normal respiratory effort, no intercostal retractions, no accessory muscle use. Heart: RRR,  no murmur.  Abdomen:  Not distended, soft, non-tender. No rebound or rigidity. No mass,organomegaly Muscle skeletal: no pretibial edema bilaterally  Skin: Exposed areas without rash. Not pale. Not jaundice Neurologic:  alert & oriented X3.  Speech normal, gait appropriate for age and unassisted Strength symmetric and appropriate for age.  Psych: Cognition and judgment appear intact.  Cooperative with normal attention span and concentration.  Behavior appropriate. No anxious or depressed appearing.       Assessment & Plan:

## 2015-01-10 NOTE — Assessment & Plan Note (Addendum)
Td 2009 Female  care -- per gyn diet, exercise, safe sex, safe driving discussed Labs  --CMP, TSH and HIV

## 2015-01-11 LAB — HIV ANTIBODY (ROUTINE TESTING W REFLEX): HIV 1&2 Ab, 4th Generation: NONREACTIVE

## 2015-01-14 ENCOUNTER — Telehealth: Payer: Self-pay | Admitting: Internal Medicine

## 2015-01-14 DIAGNOSIS — E039 Hypothyroidism, unspecified: Secondary | ICD-10-CM

## 2015-01-14 NOTE — Telephone Encounter (Signed)
Caller name:Tena Nobel Relationship to patient:self Can be [phone] Pharmacy:  Reason for call:requesting lab results

## 2015-01-14 NOTE — Telephone Encounter (Signed)
Please advise 

## 2015-01-14 NOTE — Telephone Encounter (Signed)
Good results except for TSH which is elevated due to poor compliance. Plan: Restart Synthroid, come back in 8 weeks for TSH check.  Please enter orders

## 2015-01-14 NOTE — Telephone Encounter (Signed)
LMOM informing Pt of lab results, informed her to restart Synthroid and return in 8 weeks to recheck TSH. Informed her to call back if she had any further questions.

## 2015-03-07 ENCOUNTER — Other Ambulatory Visit: Payer: BLUE CROSS/BLUE SHIELD

## 2015-03-22 ENCOUNTER — Telehealth: Payer: Self-pay | Admitting: Internal Medicine

## 2015-03-22 NOTE — Telephone Encounter (Signed)
Patient is due for a TSH, please arrange 

## 2015-03-24 NOTE — Telephone Encounter (Signed)
Pt had lab appt scheduled for March 07, 2015 to recheck TSH, Pt missed appt. Letter printed and mailed to Pt informing her that she is overdue to recheck her TSH to call office at her earliest convenience to schedule another lab appt.

## 2015-04-01 ENCOUNTER — Telehealth: Payer: Self-pay | Admitting: Internal Medicine

## 2015-04-01 NOTE — Telephone Encounter (Signed)
Advised pt, pt states she will check with her insurance and her university. Pt is considering having TB done thru college.

## 2015-04-01 NOTE — Telephone Encounter (Signed)
Okay to schedule nurse visit for TB test, or if she would just like to have blood drawn at lab we can order TB Gold blood test.

## 2015-04-01 NOTE — Telephone Encounter (Signed)
Relation to pt: self  Call back number:  [phone]   Reason for call:  Pt requesting a nurse visit only for TB (for school purposes) pt would like to come in tomorrow 04/02/15. Please advise

## 2015-04-02 ENCOUNTER — Other Ambulatory Visit (INDEPENDENT_AMBULATORY_CARE_PROVIDER_SITE_OTHER): Payer: BLUE CROSS/BLUE SHIELD

## 2015-04-02 DIAGNOSIS — E039 Hypothyroidism, unspecified: Secondary | ICD-10-CM | POA: Diagnosis not present

## 2015-04-02 LAB — TSH: TSH: 4.48 u[IU]/mL (ref 0.35–4.50)

## 2015-04-03 MED ORDER — LEVOTHYROXINE SODIUM 75 MCG PO TABS: 75.0000 ug | ORAL_TABLET | Freq: Every day | ORAL | Status: DC

## 2015-04-03 NOTE — Addendum Note (Signed)
Addended by: Dorette GrateFAULKNER, Meli Faley C on: 04/03/2015 04:57 PM   Modules accepted: Orders, Medications

## 2015-05-20 ENCOUNTER — Telehealth: Payer: Self-pay | Admitting: Internal Medicine

## 2015-05-20 ENCOUNTER — Ambulatory Visit: Payer: BLUE CROSS/BLUE SHIELD | Admitting: Internal Medicine

## 2015-05-20 NOTE — Telephone Encounter (Signed)
No, thx 

## 2015-05-20 NOTE — Telephone Encounter (Signed)
Pt will be no show today 05/20/15 4:00pm, she called stating her work meeting is running long and she won't be here before 4:30 or 5:00. She scheduled the appt this morning. Rescheduled for 8/24 2:45pm at her request. Charge for no show?

## 2015-05-21 ENCOUNTER — Encounter: Payer: Self-pay | Admitting: Internal Medicine

## 2015-05-21 ENCOUNTER — Ambulatory Visit (INDEPENDENT_AMBULATORY_CARE_PROVIDER_SITE_OTHER): Payer: BLUE CROSS/BLUE SHIELD | Admitting: Internal Medicine

## 2015-05-21 VITALS — BP 107/68 | HR 64 | Temp 97.9°F | Ht 61.0 in | Wt 161.2 lb

## 2015-05-21 DIAGNOSIS — R05 Cough: Secondary | ICD-10-CM | POA: Diagnosis not present

## 2015-05-21 DIAGNOSIS — R059 Cough, unspecified: Secondary | ICD-10-CM

## 2015-05-21 DIAGNOSIS — E039 Hypothyroidism, unspecified: Secondary | ICD-10-CM | POA: Diagnosis not present

## 2015-05-21 DIAGNOSIS — Z8709 Personal history of other diseases of the respiratory system: Secondary | ICD-10-CM | POA: Diagnosis not present

## 2015-05-21 DIAGNOSIS — Z87898 Personal history of other specified conditions: Secondary | ICD-10-CM

## 2015-05-21 MED ORDER — ALBUTEROL SULFATE HFA 108 (90 BASE) MCG/ACT IN AERS: 2.0000 | INHALATION_SPRAY | Freq: Four times a day (QID) | RESPIRATORY_TRACT | Status: DC | PRN

## 2015-05-21 MED ORDER — PREDNISONE 10 MG PO TABS: ORAL_TABLET | ORAL | Status: DC

## 2015-05-21 MED ORDER — BUDESONIDE-FORMOTEROL FUMARATE 160-4.5 MCG/ACT IN AERO: 2.0000 | INHALATION_SPRAY | Freq: Two times a day (BID) | RESPIRATORY_TRACT | Status: DC

## 2015-05-21 NOTE — Progress Notes (Signed)
Subjective:    Patient ID: Loretta [name], female    DOB: 1991-02-17, 24 y.o.   MRN: [id number]  DOS:  05/21/2015 Type of visit - description : Acute   Interval history:  Symptoms started approximately 10 days ago, having cough, end expiratory wheezing. Has albuterol at home and is not helping that much. Previously asthma was under very good control.    Review of Systems Denies fever chills No sinus pain, congestion or nasal discharge No nausea vomiting No recent GERD symptoms Occasionally, she brings up very small amount of white sputum with cough. Not taking birth control tablets, she's not sexually active. Last menstrual period 3 weeks ago.  Past Medical History  Diagnosis Date  . Neurofibromatosis, type 1 (von Recklinghausen's disease)   . History of cardiac murmur     "innocent murmur" 2011 - states has resolved  . Headache(784.0)     stress/migraine  . Hypothyroidism     has not taken medication since 09/2014  . Seasonal asthma     prn inhaler  . History of strabismus   . ACL tear 11/2014    left  . ACL (anterior cruciate ligament) tear   . Asthma 03/27/2012    Past Surgical History  Procedure Laterality Date  . Strabismus surgery      age 4  . Tumor excision Right as an infant    lower eyelid  . Strabismus surgery Right 03/01/2014    Procedure: REPAIR STRABISMUS BILATERAL;  Surgeon: Shara Blazing, MD;  Location: [facility];  Service: Ophthalmology;  Laterality: Right;  . Anterior cruciate ligament repair Left scheduled April 2016    Hamstring graft     Social History   Social History  . Marital Status: Single    Spouse Name: N/A  . Number of Children: 0  . Years of Education: College   Occupational History  . not working at present    Social History Main Topics  . Smoking status: Never Smoker   . Smokeless tobacco: Never Used  . Alcohol Use: Yes     Comment: rarely  . Drug Use: No  . Sexual Activity: No   Other Topics Concern    . Not on file   Social History Narrative   G0P0   Patient lives at home with her parents.    Left handed.    Nancy Fetter [phone]        Medication List       This list is accurate as of: 05/21/15 11:59 PM.  Always use your most recent med list.               albuterol 108 (90 BASE) MCG/ACT inhaler  Commonly known as:  PROVENTIL HFA;VENTOLIN HFA  Inhale 2 puffs into the lungs every 6 (six) hours as needed for wheezing.     budesonide-formoterol 160-4.5 MCG/ACT inhaler  Commonly known as:  SYMBICORT  Inhale 2 puffs into the lungs 2 (two) times daily.     FISH OIL PO  Take by mouth.     levothyroxine 75 MCG tablet  Commonly known as:  SYNTHROID, LEVOTHROID  Take 1 tablet (75 mcg total) by mouth daily before breakfast.     OVER THE COUNTER MEDICATION  1 tablet daily. Magnolia Bark-Vegetarian     predniSONE 10 MG tablet  Commonly known as:  DELTASONE  4 tablets x 2 days, 3 tabs x 2 days, 2 tabs x 2 days, 1 tab x 2 days  Objective:   Physical Exam BP 107/68 mmHg  Pulse 64  Temp(Src) 97.9 F (36.6 C) (Oral)  Ht 5\' 1"  (1.549 m)  Wt 161 lb 4 oz (73.143 kg)  BMI 30.48 kg/m2  SpO2 96%  LMP 04/25/2015 (Exact Date) General:   Well developed, well nourished . NAD.  HEENT:  Normocephalic . Face symmetric, atraumatic. TMs normal, throat symmetric and not red, nose not congested, maxillary sinuses not tender Lungs:  CTA B . Slightly increased expiratory time Normal respiratory effort, no intercostal retractions, no accessory muscle use. Heart: RRR,  no murmur.  No pretibial edema bilaterally  Skin: Not pale. Not jaundice Neurologic:  alert & oriented X3.  Speech normal, gait appropriate for age and unassisted Psych--  Cognition and judgment appear intact.  Cooperative with normal attention span and concentration.  Behavior appropriate. No anxious or depressed appearing.      Assessment & Plan:   Cough Cough  for 10 days, history of asthma, +  subjective wheezing and a slightly increased expiratory time. Symptoms likely due to bronchospasm. No evidence of infection. Plan: Add Symbicort, continue albuterol, prednisone for a few days. If not getting better she will let me know.  Hypothyroidism: Check a TSH

## 2015-05-21 NOTE — Progress Notes (Signed)
Pre visit review using our clinic review tool, if applicable. No additional management support is needed unless otherwise documented below in the visit note. 

## 2015-05-21 NOTE — Patient Instructions (Signed)
Go to the lab  Take prednisone as prescribed  Symbicort 2 puffs twice a day for at least 3 weeks  Albuterol as needed  Call anytime if you have fever, chills or increased symptoms. Call if not improving gradually in the next few days   Don't  forget your flu shot this season

## 2015-05-22 LAB — TSH: TSH: 3 u[IU]/mL (ref 0.35–4.50)

## 2015-05-29 ENCOUNTER — Telehealth: Payer: Self-pay | Admitting: Internal Medicine

## 2015-05-29 NOTE — Telephone Encounter (Signed)
LMOM informing Pt that labs were mailed to Pt on 05/22/2015. Informed her that lab results were normal and to continue the same Synthroid dosage. Instructed her to call when she needed refills.

## 2015-05-29 NOTE — Telephone Encounter (Signed)
Caller name:Krisna Relationship to patient:self Can be reached:[phone] Pharmacy:  Reason for call:wants lab results

## 2015-08-14 ENCOUNTER — Other Ambulatory Visit: Payer: Self-pay | Admitting: Internal Medicine

## 2015-10-24 ENCOUNTER — Telehealth: Payer: Self-pay | Admitting: Internal Medicine

## 2015-10-24 NOTE — Telephone Encounter (Signed)
Flu shot declined

## 2015-10-24 NOTE — Telephone Encounter (Signed)
Pt declined flu shot °

## 2016-01-16 ENCOUNTER — Encounter: Payer: BLUE CROSS/BLUE SHIELD | Admitting: Internal Medicine

## 2016-05-13 ENCOUNTER — Encounter: Payer: BLUE CROSS/BLUE SHIELD | Admitting: Internal Medicine

## 2016-06-18 ENCOUNTER — Encounter: Payer: Self-pay | Admitting: Women's Health

## 2016-06-18 ENCOUNTER — Ambulatory Visit (INDEPENDENT_AMBULATORY_CARE_PROVIDER_SITE_OTHER): Payer: 59 | Admitting: Women's Health

## 2016-06-18 VITALS — BP 110/78 | Ht 61.0 in | Wt 141.0 lb

## 2016-06-18 DIAGNOSIS — Z01419 Encounter for gynecological examination (general) (routine) without abnormal findings: Secondary | ICD-10-CM | POA: Diagnosis not present

## 2016-06-18 DIAGNOSIS — E038 Other specified hypothyroidism: Secondary | ICD-10-CM | POA: Diagnosis not present

## 2016-06-18 LAB — CBC WITH DIFFERENTIAL/PLATELET
BASOS ABS: 52 {cells}/uL (ref 0–200)
Basophils Relative: 1 %
Eosinophils Absolute: 104 cells/uL (ref 15–500)
Eosinophils Relative: 2 %
HCT: 34.9 % — ABNORMAL LOW (ref 35.0–45.0)
HEMOGLOBIN: 11.3 g/dL — AB (ref 11.7–15.5)
LYMPHS ABS: 1716 {cells}/uL (ref 850–3900)
Lymphocytes Relative: 33 %
MCH: 25.9 pg — AB (ref 27.0–33.0)
MCHC: 32.4 g/dL (ref 32.0–36.0)
MCV: 80 fL (ref 80.0–100.0)
MPV: 9.6 fL (ref 7.5–12.5)
Monocytes Absolute: 520 cells/uL (ref 200–950)
Monocytes Relative: 10 %
NEUTROS ABS: 2808 {cells}/uL (ref 1500–7800)
Neutrophils Relative %: 54 %
PLATELETS: 343 10*3/uL (ref 140–400)
RBC: 4.36 MIL/uL (ref 3.80–5.10)
RDW: 16.3 % — ABNORMAL HIGH (ref 11.0–15.0)
WBC: 5.2 10*3/uL (ref 3.8–10.8)

## 2016-06-18 LAB — TSH: TSH: 6.71 mIU/L — ABNORMAL HIGH

## 2016-06-18 MED ORDER — LEVOTHYROXINE SODIUM 75 MCG PO TABS: 75.0000 ug | ORAL_TABLET | Freq: Every day | ORAL | 2 refills | Status: DC

## 2016-06-18 NOTE — Patient Instructions (Signed)
Health Maintenance, Female Adopting a healthy lifestyle and getting preventive care can go a long way to promote health and wellness. Talk with your health care provider about what schedule of regular examinations is right for you. This is a good chance for you to check in with your provider about disease prevention and staying healthy. In between checkups, there are plenty of things you can do on your own. Experts have done a lot of research about which lifestyle changes and preventive measures are most likely to keep you healthy. Ask your health care provider for more information. WEIGHT AND DIET  Eat a healthy diet  Be sure to include plenty of vegetables, fruits, low-fat dairy products, and lean protein.  Do not eat a lot of foods high in solid fats, added sugars, or salt.  Get regular exercise. This is one of the most important things you can do for your health.  Most adults should exercise for at least 150 minutes each week. The exercise should increase your heart rate and make you sweat (moderate-intensity exercise).  Most adults should also do strengthening exercises at least twice a week. This is in addition to the moderate-intensity exercise.  Maintain a healthy weight  Body mass index (BMI) is a measurement that can be used to identify possible weight problems. It estimates body fat based on height and weight. Your health care provider can help determine your BMI and help you achieve or maintain a healthy weight.  For females 20 years of age and older:   A BMI below 18.5 is considered underweight.  A BMI of 18.5 to 24.9 is normal.  A BMI of 25 to 29.9 is considered overweight.  A BMI of 30 and above is considered obese.  Watch levels of cholesterol and blood lipids  You should start having your blood tested for lipids and cholesterol at 24 years of age, then have this test every 5 years.  You may need to have your cholesterol levels checked more often if:  Your lipid  or cholesterol levels are high.  You are older than 24 years of age.  You are at high risk for heart disease.  CANCER SCREENING   Lung Cancer  Lung cancer screening is recommended for adults 55-80 years old who are at high risk for lung cancer because of a history of smoking.  A yearly low-dose CT scan of the lungs is recommended for people who:  Currently smoke.  Have quit within the past 15 years.  Have at least a 30-pack-year history of smoking. A pack year is smoking an average of one pack of cigarettes a day for 1 year.  Yearly screening should continue until it has been 15 years since you quit.  Yearly screening should stop if you develop a health problem that would prevent you from having lung cancer treatment.  Breast Cancer  Practice breast self-awareness. This means understanding how your breasts normally appear and feel.  It also means doing regular breast self-exams. Let your health care provider know about any changes, no matter how small.  If you are in your 20s or 30s, you should have a clinical breast exam (CBE) by a health care provider every 1-3 years as part of a regular health exam.  If you are 40 or older, have a CBE every year. Also consider having a breast X-ray (mammogram) every year.  If you have a family history of breast cancer, talk to your health care provider about genetic screening.  If you   are at high risk for breast cancer, talk to your health care provider about having an MRI and a mammogram every year.  Breast cancer gene (BRCA) assessment is recommended for women who have family members with BRCA-related cancers. BRCA-related cancers include:  Breast.  Ovarian.  Tubal.  Peritoneal cancers.  Results of the assessment will determine the need for genetic counseling and BRCA1 and BRCA2 testing. Cervical Cancer Your health care provider may recommend that you be screened regularly for cancer of the pelvic organs (ovaries, uterus, and  vagina). This screening involves a pelvic examination, including checking for microscopic changes to the surface of your cervix (Pap test). You may be encouraged to have this screening done every 3 years, beginning at age 21.  For women ages 30-65, health care providers may recommend pelvic exams and Pap testing every 3 years, or they may recommend the Pap and pelvic exam, combined with testing for human papilloma virus (HPV), every 5 years. Some types of HPV increase your risk of cervical cancer. Testing for HPV may also be done on women of any age with unclear Pap test results.  Other health care providers may not recommend any screening for nonpregnant women who are considered low risk for pelvic cancer and who do not have symptoms. Ask your health care provider if a screening pelvic exam is right for you.  If you have had past treatment for cervical cancer or a condition that could lead to cancer, you need Pap tests and screening for cancer for at least 20 years after your treatment. If Pap tests have been discontinued, your risk factors (such as having a new sexual partner) need to be reassessed to determine if screening should resume. Some women have medical problems that increase the chance of getting cervical cancer. In these cases, your health care provider may recommend more frequent screening and Pap tests. Colorectal Cancer  This type of cancer can be detected and often prevented.  Routine colorectal cancer screening usually begins at 24 years of age and continues through 24 years of age.  Your health care provider may recommend screening at an earlier age if you have risk factors for colon cancer.  Your health care provider may also recommend using home test kits to check for hidden blood in the stool.  A small camera at the end of a tube can be used to examine your colon directly (sigmoidoscopy or colonoscopy). This is done to check for the earliest forms of colorectal  cancer.  Routine screening usually begins at age 50.  Direct examination of the colon should be repeated every 5-10 years through 24 years of age. However, you may need to be screened more often if early forms of precancerous polyps or small growths are found. Skin Cancer  Check your skin from head to toe regularly.  Tell your health care provider about any new moles or changes in moles, especially if there is a change in a mole's shape or color.  Also tell your health care provider if you have a mole that is larger than the size of a pencil eraser.  Always use sunscreen. Apply sunscreen liberally and repeatedly throughout the day.  Protect yourself by wearing long sleeves, pants, a wide-brimmed hat, and sunglasses whenever you are outside. HEART DISEASE, DIABETES, AND HIGH BLOOD PRESSURE   High blood pressure causes heart disease and increases the risk of stroke. High blood pressure is more likely to develop in:  People who have blood pressure in the high end   of the normal range (130-139/85-89 mm Hg).  People who are overweight or obese.  People who are African American.  If you are 38-23 years of age, have your blood pressure checked every 3-5 years. If you are 61 years of age or older, have your blood pressure checked every year. You should have your blood pressure measured twice--once when you are at a hospital or clinic, and once when you are not at a hospital or clinic. Record the average of the two measurements. To check your blood pressure when you are not at a hospital or clinic, you can use:  An automated blood pressure machine at a pharmacy.  A home blood pressure monitor.  If you are between 45 years and 39 years old, ask your health care provider if you should take aspirin to prevent strokes.  Have regular diabetes screenings. This involves taking a blood sample to check your fasting blood sugar level.  If you are at a normal weight and have a low risk for diabetes,  have this test once every three years after 24 years of age.  If you are overweight and have a high risk for diabetes, consider being tested at a younger age or more often. PREVENTING INFECTION  Hepatitis B  If you have a higher risk for hepatitis B, you should be screened for this virus. You are considered at high risk for hepatitis B if:  You were born in a country where hepatitis B is common. Ask your health care provider which countries are considered high risk.  Your parents were born in a high-risk country, and you have not been immunized against hepatitis B (hepatitis B vaccine).  You have HIV or AIDS.  You use needles to inject street drugs.  You live with someone who has hepatitis B.  You have had sex with someone who has hepatitis B.  You get hemodialysis treatment.  You take certain medicines for conditions, including cancer, organ transplantation, and autoimmune conditions. Hepatitis C  Blood testing is recommended for:  Everyone born from [birth year] through 1964.  Anyone with known risk factors for hepatitis C. Sexually transmitted infections (STIs)  You should be screened for sexually transmitted infections (STIs) including gonorrhea and chlamydia if:  You are sexually active and are younger than 24 years of age.  You are older than 24 years of age and your health care provider tells you that you are at risk for this type of infection.  Your sexual activity has changed since you were last screened and you are at an increased risk for chlamydia or gonorrhea. Ask your health care provider if you are at risk.  If you do not have HIV, but are at risk, it may be recommended that you take a prescription medicine daily to prevent HIV infection. This is called pre-exposure prophylaxis (PrEP). You are considered at risk if:  You are sexually active and do not regularly use condoms or know the HIV status of your partner(s).  You take drugs by injection.  You are sexually  active with a partner who has HIV. Talk with your health care provider about whether you are at high risk of being infected with HIV. If you choose to begin PrEP, you should first be tested for HIV. You should then be tested every 3 months for as long as you are taking PrEP.  PREGNANCY   If you are premenopausal and you may become pregnant, ask your health care provider about preconception counseling.  If you may  become pregnant, take 400 to 800 micrograms (mcg) of folic acid every day.  If you want to prevent pregnancy, talk to your health care provider about birth control (contraception). OSTEOPOROSIS AND MENOPAUSE   Osteoporosis is a disease in which the bones lose minerals and strength with aging. This can result in serious bone fractures. Your risk for osteoporosis can be identified using a bone density scan.  If you are 61 years of age or older, or if you are at risk for osteoporosis and fractures, ask your health care provider if you should be screened.  Ask your health care provider whether you should take a calcium or vitamin D supplement to lower your risk for osteoporosis.  Menopause may have certain physical symptoms and risks.  Hormone replacement therapy may reduce some of these symptoms and risks. Talk to your health care provider about whether hormone replacement therapy is right for you.  HOME CARE INSTRUCTIONS   Schedule regular health, dental, and eye exams.  Stay current with your immunizations.   Do not use any tobacco products including cigarettes, chewing tobacco, or electronic cigarettes.  If you are pregnant, do not drink alcohol.  If you are breastfeeding, limit how much and how often you drink alcohol.  Limit alcohol intake to no more than 1 drink per day for nonpregnant women. One drink equals 12 ounces of beer, 5 ounces of wine, or 1 ounces of hard liquor.  Do not use street drugs.  Do not share needles.  Ask your health care provider for help if  you need support or information about quitting drugs.  Tell your health care provider if you often feel depressed.  Tell your health care provider if you have ever been abused or do not feel safe at home.   This information is not intended to replace advice given to you by your health care provider. Make sure you discuss any questions you have with your health care provider.   Document Released: 03/29/2011 Document Revised: 10/04/2014 Document Reviewed: 08/15/2013 Elsevier Interactive Patient Education [employer].

## 2016-06-18 NOTE — Addendum Note (Signed)
Addended by: Kem ParkinsonBARNES, Tirso Laws on: 06/18/2016 08:54 AM   Modules accepted: Orders

## 2016-06-18 NOTE — Progress Notes (Signed)
Larwance SachsLisette Graffius 1991-03-15 [id number]    History:    Presents for annual exam.  Monthly cycle/virgin. Gardasil series completed. Normal Pap history. Hypothyroid off Synthroid several months had TSH checked in [place] okay per patient.  Past medical history, past surgical history, family history and social history were all reviewed and documented in the EPIC chart. 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 History of neurofibromatasis mother also has. Parents healthy.  ROS:  A ROS was performed and pertinent positives and negatives are included.  Exam:  Vitals:   06/18/16 0818  BP: 110/78  Weight: 141 lb (64 kg)  Height: 5\' 1"  (1.549 m)   Body mass index is 26.64 kg/m.   General appearance:  Normal Thyroid:  Symmetrical, normal in size, without palpable masses or nodularity. Respiratory  Auscultation:  Clear without wheezing or rhonchi Cardiovascular  Auscultation:  Regular rate, without rubs, murmurs or gallops  Edema/varicosities:  Not grossly evident Abdominal  Soft,nontender, without masses, guarding or rebound.  Liver/spleen:  No organomegaly noted  Hernia:  None appreciated  Skin  Inspection:  Grossly normalMultiple birthmarks/neurofibromatosis   Breasts: Examined lying and sitting.     Right: Without masses, retractions, discharge or axillary adenopathy.     Left: Without masses, retractions, discharge or axillary adenopathy. Gentitourinary   Inguinal/mons:  Normal without inguinal adenopathy  External genitalia:  Normal  BUS/Urethra/Skene's glands:  Normal  Vagina:  Normal  Cervix:  Normal  Uterus:  normal in size, shape and contour.  Midline and mobile  Adnexa/parametria:     Rt: Without masses or tenderness.   Lt: Without masses or tenderness.  Anus and perineum: Normal    Assessment/Plan:  25 y.o. SHF virgin for annual exam no complaints.  Monthly cycle without complaint Hypothyroid has been off medication several months  Plan:  Contraception options reviewed and declined. SBE's, exercise, calcium rich diet, MVI daily encouraged. Synthroid 75 g by mouth daily prescription, proper use given and reviewed will also check TSH, T3-T4. CBC, UA, Pap.  Harrington ChallengerYOUNG,NANCY J [facility] CenterWHNP, 8:45 AM 06/18/2016

## 2016-06-19 LAB — URINALYSIS W MICROSCOPIC + REFLEX CULTURE
Bacteria, UA: NONE SEEN [HPF]
Bilirubin Urine: NEGATIVE
CASTS: NONE SEEN [LPF]
Crystals: NONE SEEN [HPF]
Glucose, UA: NEGATIVE
Hgb urine dipstick: NEGATIVE
Ketones, ur: NEGATIVE
Leukocytes, UA: NEGATIVE
Nitrite: NEGATIVE
PH: 6.5 (ref 5.0–8.0)
Protein, ur: NEGATIVE
RBC / HPF: NONE SEEN RBC/HPF (ref <=2)
SPECIFIC GRAVITY, URINE: 1.021 (ref 1.001–1.035)
Squamous Epithelial / LPF: NONE SEEN [HPF] (ref <=5)
WBC, UA: NONE SEEN WBC/HPF (ref <=5)
Yeast: NONE SEEN [HPF]

## 2016-06-19 LAB — T3 UPTAKE: T3 Uptake: 31 % (ref 22–35)

## 2016-06-19 LAB — T4: T4 TOTAL: 6.1 ug/dL (ref 4.5–12.0)

## 2016-06-21 ENCOUNTER — Other Ambulatory Visit: Payer: Self-pay | Admitting: Women's Health

## 2016-06-21 DIAGNOSIS — R7989 Other specified abnormal findings of blood chemistry: Secondary | ICD-10-CM

## 2016-06-21 LAB — PAP IG W/ RFLX HPV ASCU

## 2016-07-08 ENCOUNTER — Ambulatory Visit (INDEPENDENT_AMBULATORY_CARE_PROVIDER_SITE_OTHER): Payer: 59 | Admitting: Internal Medicine

## 2016-07-08 ENCOUNTER — Encounter: Payer: Self-pay | Admitting: Internal Medicine

## 2016-07-08 VITALS — BP 118/72 | HR 63 | Temp 98.0°F | Wt 136.2 lb

## 2016-07-08 DIAGNOSIS — L299 Pruritus, unspecified: Secondary | ICD-10-CM

## 2016-07-08 MED ORDER — PREDNISOLONE ACETATE 0.12 % OP SUSP: OPHTHALMIC | 0 refills | Status: DC

## 2016-07-08 NOTE — Patient Instructions (Signed)
Put the drop in the RIGHT ear: 2 drops , 3 times a day for 5 days  Call if no better

## 2016-07-08 NOTE — Progress Notes (Signed)
Pre visit review using our clinic review tool, if applicable. No additional management support is needed unless otherwise documented below in the visit note. 

## 2016-07-08 NOTE — Progress Notes (Signed)
Subjective:    Patient ID: Loretta SachsLisette [name], female    DOB: February 26, 1991, 25 y.o.   MRN: [id number]  DOS:  07/08/2016 Type of visit - description : Acute visit Interval history: Few days history of right ear itching, she wa  scratching the ear yesterday and saw some blood. No bleeding today. Left ear is normal    Review of Systems Denies fever, chills. No runny nose or sore throat No chest congestion or cough  Past Medical History:  Diagnosis Date  . ACL (anterior cruciate ligament) tear   . ACL tear 11/2014   left  . Asthma 03/27/2012  . Headache(784.0)    stress/migraine  . History of cardiac murmur    "innocent murmur" 2011 - states has resolved  . History of strabismus   . Hypothyroidism    has not taken medication since 09/2014  . Neurofibromatosis, type 1 (von Recklinghausen's disease) (HCC)   . Seasonal asthma    prn inhaler    Past Surgical History:  Procedure Laterality Date  . ANTERIOR CRUCIATE LIGAMENT REPAIR Left scheduled April 2016   Hamstring graft   . STRABISMUS SURGERY     age 25  . STRABISMUS SURGERY Right 03/01/2014   Procedure: REPAIR STRABISMUS BILATERAL;  Surgeon: Shara BlazingWilliam O Young, MD;  Location: [facility];  Service: Ophthalmology;  Laterality: Right;  . TUMOR EXCISION Right as an infant   lower eyelid    Social History   Social History  . Marital status: Single    Spouse name: N/A  . Number of children: 0  . Years of education: College   Occupational History  . not working at present    Social History Main Topics  . Smoking status: Never Smoker  . Smokeless tobacco: Never Used  . Alcohol use Yes     Comment: rarely  . Drug use: No  . Sexual activity: No     Comment: VIRGIN   Other Topics Concern  . Not on file   Social History Narrative   G0P0   Patient lives at home with her parents.    Left handed.    Nancy [employer] [id number]        Medication List       Accurate as of 07/08/16 11:27 AM. Always use your most  recent med list.          albuterol 108 (90 Base) MCG/ACT inhaler Commonly known as:  PROVENTIL HFA;VENTOLIN HFA Inhale 2 puffs into the lungs every 6 (six) hours as needed for wheezing.   FISH OIL PO Take by mouth.   levothyroxine 75 MCG tablet Commonly known as:  SYNTHROID, LEVOTHROID Take 1 tablet (75 mcg total) by mouth daily before breakfast.   prednisoLONE acetate 0.12 % ophthalmic suspension Commonly known as:  PRED MILD As directed          Objective:   Physical Exam BP 118/72 (BP Location: Left Arm, Patient Position: Sitting, Cuff Size: Normal)   Pulse 63   Temp 98 F (36.7 C) (Oral)   Wt 136 lb 3.2 oz (61.8 kg)   LMP 12/08/2014 (Exact Date)   SpO2 97%   BMI 25.73 kg/m  General:   Well developed, well nourished . NAD.  HEENT:  Normocephalic . Face symmetric, atraumatic. Left ear normal Right year: TMs normal, canal with minimal redness at the upper external aspect without discharge. No obvious area of bleeding. Trago sign (-) B Throat: Symmetric, no red    Neurological exam: Alert oriented  3, face symmetric, motor symmetric     Assessment & Plan:   Right ear discomfort, itching: No evidence of infection or bleeding. There is some mild irritation at the ear canal, eczema? Will prescribe prednisone drops for few days, if not better let me know. Hypothyroidism: Reports that is handled by gynecology

## 2016-07-27 ENCOUNTER — Ambulatory Visit (INDEPENDENT_AMBULATORY_CARE_PROVIDER_SITE_OTHER): Payer: 59 | Admitting: Internal Medicine

## 2016-07-27 ENCOUNTER — Encounter: Payer: Self-pay | Admitting: Internal Medicine

## 2016-07-27 VITALS — BP 102/68 | HR 80 | Temp 97.6°F | Resp 14 | Ht 61.0 in | Wt 137.1 lb

## 2016-07-27 DIAGNOSIS — Z0000 Encounter for general adult medical examination without abnormal findings: Secondary | ICD-10-CM

## 2016-07-27 DIAGNOSIS — E039 Hypothyroidism, unspecified: Secondary | ICD-10-CM

## 2016-07-27 NOTE — Progress Notes (Signed)
Pre visit review using our clinic review tool, if applicable. No additional management support is needed unless otherwise documented below in the visit note. 

## 2016-07-27 NOTE — Progress Notes (Signed)
Subjective:    Patient ID: Loretta [name], female    DOB: [birth year], 25 y.o.   MRN: [id number]  DOS:  07/27/2016 Type of visit - description : cpx Interval history:No major concerns    Review of Systems  Constitutional: No fever. No chills. No unexplained wt changes. No unusual sweats  HEENT: No dental problems, no ear discharge, no facial swelling, no voice changes. No eye discharge, no eye  redness , no  intolerance to light   Respiratory: No wheezing , no  difficulty breathing. Cough for few days, no other symptoms.  Cardiovascular: No CP, no leg swelling , no  Palpitations  GI: no nausea, no vomiting, no diarrhea , no  abdominal pain.  No blood in the stools. No dysphagia, no odynophagia    Endocrine: No polyphagia, no polyuria , no polydipsia  GU: No dysuria, gross hematuria, difficulty urinating. No urinary urgency, no frequency.  Musculoskeletal: No joint swellings or unusual aches or pains  Skin: No change in the color of the skin, palor , no  Rash  Allergic, immunologic: No environmental allergies , no  food allergies  Neurological: No dizziness no  syncope. No headaches. No diplopia, no slurred, no slurred speech, no motor deficits, no facial  Numbness  Hematological: No enlarged lymph nodes, no easy bruising , no unusual bleedings  Psychiatry: No suicidal ideas, no hallucinations, no beavior problems, no confusion.  No unusual/severe anxiety, no depression  Past Medical History:  Diagnosis Date  . ACL (anterior cruciate ligament) tear   . ACL tear 11/2014   left  . Asthma 03/27/2012  . Headache(784.0)    stress/migraine  . History of cardiac murmur    "innocent murmur" 2011 - states has resolved  . History of strabismus   . Hypothyroidism    has not taken medication since 09/2014  . Neurofibromatosis, type 1 (von Recklinghausen's disease) (HCC)   . Seasonal asthma    prn inhaler    Past Surgical History:  Procedure Laterality Date  . ANTERIOR  CRUCIATE LIGAMENT REPAIR Left scheduled April 2016   Hamstring graft   . STRABISMUS SURGERY     age 25  . STRABISMUS SURGERY Right 03/01/2014   Procedure: REPAIR STRABISMUS BILATERAL;  Surgeon: Shara BlazingWilliam O Young, MD;  Location: [facility];  Service: Ophthalmology;  Laterality: Right;  . TUMOR EXCISION Right as an infant   lower eyelid    Social History   Social History  . Marital status: Single    Spouse name: N/A  . Number of children: 0  . Years of education: College   Occupational History  . recreational therapist, works part time     Social History Main Topics  . Smoking status: Never Smoker  . Smokeless tobacco: Never Used  . Alcohol use Yes     Comment: rarely  . Drug use: No  . Sexual activity: No     Comment: VIRGIN   Other Topics Concern  . Not on file   Social History Narrative   G0P0   Patient lives at home with her parents.    Left handed.    [employer]ancy [employer] 01-2014     Family History  Problem Relation Age of Onset  . Diabetes Maternal Grandmother   . Colon cancer Neg Hx   . Breast cancer Neg Hx       Medication List       Accurate as of 07/27/16 11:59 PM. Always use your most recent med list.  albuterol 108 (90 Base) MCG/ACT inhaler Commonly known as:  PROVENTIL HFA;VENTOLIN HFA Inhale 2 puffs into the lungs every 6 (six) hours as needed for wheezing.   FISH OIL PO Take by mouth.   levothyroxine 75 MCG tablet Commonly known as:  SYNTHROID, LEVOTHROID Take 1 tablet (75 mcg total) by mouth daily before breakfast.   prednisoLONE acetate 0.12 % ophthalmic suspension Commonly known as:  PRED MILD As directed          Objective:   Physical Exam BP 102/68 (BP Location: Right Arm, Patient Position: Sitting, Cuff Size: Small)   Pulse 80   Temp 97.6 F (36.4 C) (Oral)   Resp 14   Ht 5\' 1"  (1.549 m)   Wt 137 lb 2 oz (62.2 kg)   SpO2 96%   BMI 25.91 kg/m    General:   Well developed, well nourished . NAD.  Neck:  No  thyromegaly  HEENT:  Normocephalic . Face symmetric, atraumatic. Ears normal. Lungs:  CTA B Normal respiratory effort, no intercostal retractions, no accessory muscle use. Heart: RRR,  no murmur.  No pretibial edema bilaterally  Abdomen:  Not distended, soft, non-tender. No rebound or rigidity.   Skin: Exposed areas without rash. Not pale. Not jaundice Neurologic:  alert & oriented X3.  Speech normal, gait appropriate for age and unassisted Strength symmetric and appropriate for age.  Psych: Cognition and judgment appear intact.  Cooperative with normal attention span and concentration.  Behavior appropriate. No anxious or depressed appearing.    Assessment & Plan:   Assessment Hypothyroidism  GERD H/o asthma , prn albuterol, seasonal  Neurofibromatoses   Plan: Hypothyroidism: TSH a couple months ago was elevated, she was not taking Synthroid, good compliance for the last few weeks. Check a TSH Anemia: See labs, she has periods monthly, the first 3 days are heavy. No GI symptoms. Recommend iron by mouth. RTC one year.

## 2016-07-27 NOTE — Patient Instructions (Signed)
GO TO THE FRONT DESK Schedule your next appointment for a    Schedule labs to be done this week, fasting.   Safe Sex Safe sex is about reducing the risk of giving or getting a sexually transmitted disease (STD). STDs are spread through sexual contact involving the genitals, mouth, or rectum. Some STDs can be cured and others cannot. Safe sex can also prevent unintended pregnancies.  WHAT ARE SOME SAFE SEX PRACTICES?  Limit your sexual activity to only one partner who is having sex with only you.  Talk to your partner about his or her past partners, past STDs, and drug use.  Use a condom every time you have sexual intercourse. This includes vaginal, oral, and anal sexual activity. Both females and males should wear condoms during oral sex. Only use latex or polyurethane condoms and water-based lubricants. Using petroleum-based lubricants or oils to lubricate a condom will weaken the condom and increase the chance that it will break. The condom should be in place from the beginning to the end of sexual activity. Wearing a condom reduces, but does not completely eliminate, your risk of getting or giving an STD. STDs can be spread by contact with infected body fluids and skin.  Get vaccinated for hepatitis B and HPV.  Avoid alcohol and recreational drugs, which can affect your judgment. You may forget to use a condom or participate in high-risk sex.  For females, avoid douching after sexual intercourse. Douching can spread an infection farther into the reproductive tract.  Check your body for signs of sores, blisters, rashes, or unusual discharge. See your health care provider if you notice any of these signs.  Avoid sexual contact if you have symptoms of an infection or are being treated for an STD. If you or your partner has herpes, avoid sexual contact when blisters are present. Use condoms at all other times.  If you are at risk of being infected with HIV, it is recommended that you take a  prescription medicine daily to prevent HIV infection. This is called pre-exposure prophylaxis (PrEP). You are considered at risk if:  You are a man who has sex with other men (MSM).  You are a heterosexual man or woman who is sexually active with more than one partner.  You take drugs by injection.  You are sexually active with a partner who has HIV.  Talk with your health care provider about whether you are at high risk of being infected with HIV. If you choose to begin PrEP, you should first be tested for HIV. You should then be tested every 3 months for as long as you are taking PrEP.  See your health care provider for regular screenings, exams, and tests for other STDs. Before having sex with a new partner, each of you should be screened for STDs and should talk about the results with each other. WHAT ARE THE BENEFITS OF SAFE SEX?   There is less chance of getting or giving an STD.  You can prevent unwanted or unintended pregnancies.  By discussing safe sex concerns with your partner, you may increase feelings of intimacy, comfort, trust, and honesty between the two of you.   This information is not intended to replace advice given to you by your health care provider. Make sure you discuss any questions you have with your health care provider.   Document Released: 10/21/2004 Document Revised: 10/04/2014 Document Reviewed: 03/06/2012 Elsevier Interactive Patient Education [employer]2016 Elsevier Inc.

## 2016-07-27 NOTE — Assessment & Plan Note (Addendum)
Td 2010; declined flu shot  Female  care -- per gyn  diet, exercise, safe sex, safe driving discussed Labs  --BMP, FLP, TSH

## 2016-07-29 DIAGNOSIS — Z09 Encounter for follow-up examination after completed treatment for conditions other than malignant neoplasm: Secondary | ICD-10-CM | POA: Insufficient documentation

## 2016-07-29 DIAGNOSIS — Z0000 Encounter for general adult medical examination without abnormal findings: Secondary | ICD-10-CM | POA: Insufficient documentation

## 2016-07-29 NOTE — Assessment & Plan Note (Signed)
Hypothyroidism: TSH a couple months ago was elevated, she was not taking Synthroid, good compliance for the last few weeks. Check a TSH Anemia: See labs, she has periods monthly, the first 3 days are heavy. No GI symptoms. Recommend iron by mouth. RTC one year.

## 2016-08-02 ENCOUNTER — Other Ambulatory Visit (INDEPENDENT_AMBULATORY_CARE_PROVIDER_SITE_OTHER): Payer: 59

## 2016-08-02 DIAGNOSIS — Z0000 Encounter for general adult medical examination without abnormal findings: Secondary | ICD-10-CM

## 2016-08-02 DIAGNOSIS — E039 Hypothyroidism, unspecified: Secondary | ICD-10-CM

## 2016-08-02 LAB — BASIC METABOLIC PANEL
BUN: 18 mg/dL (ref 6–23)
CALCIUM: 9.8 mg/dL (ref 8.4–10.5)
CO2: 23 mEq/L (ref 19–32)
CREATININE: 0.65 mg/dL (ref 0.40–1.20)
Chloride: 104 mEq/L (ref 96–112)
GFR: 118.24 mL/min (ref >60.00)
Glucose, Bld: 94 mg/dL (ref 70–99)
Potassium: 4.1 mEq/L (ref 3.5–5.1)
Sodium: 135 mEq/L (ref 135–145)

## 2016-08-02 LAB — LIPID PANEL
Cholesterol: 129 mg/dL (ref 0–200)
HDL: 53.9 mg/dL (ref >39.00)
LDL Cholesterol: 64 mg/dL (ref 0–99)
NONHDL: 75.26
Total CHOL/HDL Ratio: 2
Triglycerides: 58 mg/dL (ref 0.0–149.0)
VLDL: 11.6 mg/dL (ref 0.0–40.0)

## 2016-08-02 LAB — TSH: TSH: 0.11 u[IU]/mL — ABNORMAL LOW (ref 0.35–4.50)

## 2016-08-05 MED ORDER — LEVOTHYROXINE SODIUM 50 MCG PO TABS: 50.0000 ug | ORAL_TABLET | Freq: Every day | ORAL | 2 refills | Status: DC

## 2016-09-13 ENCOUNTER — Other Ambulatory Visit: Payer: 59

## 2016-09-30 ENCOUNTER — Telehealth: Payer: Self-pay

## 2016-09-30 DIAGNOSIS — E039 Hypothyroidism, unspecified: Secondary | ICD-10-CM

## 2016-09-30 NOTE — Telephone Encounter (Signed)
Due for repeat TSH. MyChart message sent.

## 2016-11-15 ENCOUNTER — Other Ambulatory Visit: Payer: Self-pay | Admitting: Internal Medicine

## 2016-12-18 ENCOUNTER — Other Ambulatory Visit: Payer: Self-pay | Admitting: Internal Medicine

## 2016-12-20 ENCOUNTER — Telehealth: Payer: Self-pay | Admitting: Internal Medicine

## 2016-12-20 NOTE — Telephone Encounter (Signed)
Pt is over due for TSH labs.

## 2016-12-20 NOTE — Telephone Encounter (Signed)
Caller name: Anaria  Relation to pt: self  Call back number: [phone] Pharmacy:[id number] [place], [place] - [address]  Reason for call: Pt states is needing refill on levothyroxine (SYNTHROID, LEVOTHROID) 50 MCG tablet ( last one given in 11-15-2016). Please advise.

## 2016-12-20 NOTE — Telephone Encounter (Signed)
Called pt and LVM for pt to schedule a lab appt, then she can get her refill on meds.

## 2016-12-23 ENCOUNTER — Other Ambulatory Visit: Payer: 59

## 2017-01-06 ENCOUNTER — Other Ambulatory Visit (INDEPENDENT_AMBULATORY_CARE_PROVIDER_SITE_OTHER): Payer: 59

## 2017-01-06 DIAGNOSIS — E039 Hypothyroidism, unspecified: Secondary | ICD-10-CM

## 2017-01-06 LAB — TSH: TSH: 12.14 u[IU]/mL — ABNORMAL HIGH (ref 0.35–4.50)

## 2017-01-10 MED ORDER — LEVOTHYROXINE SODIUM 50 MCG PO TABS: 50.0000 ug | ORAL_TABLET | Freq: Every day | ORAL | 1 refills | Status: DC

## 2017-02-09 ENCOUNTER — Encounter: Payer: Self-pay | Admitting: Gynecology

## 2017-02-14 ENCOUNTER — Ambulatory Visit (INDEPENDENT_AMBULATORY_CARE_PROVIDER_SITE_OTHER): Payer: 59 | Admitting: Women's Health

## 2017-02-14 ENCOUNTER — Encounter: Payer: Self-pay | Admitting: Women's Health

## 2017-02-14 VITALS — BP 118/80 | Ht 61.0 in | Wt 137.0 lb

## 2017-02-14 DIAGNOSIS — R238 Other skin changes: Secondary | ICD-10-CM | POA: Diagnosis not present

## 2017-02-14 MED ORDER — BETAMETHASONE VALERATE 0.1 % EX CREA: TOPICAL_CREAM | Freq: Two times a day (BID) | CUTANEOUS | 0 refills | Status: DC

## 2017-02-14 NOTE — Progress Notes (Signed)
Presents with complaint of right breast itching. Started several weeks ago and now the right breast is slightly tender. No palpable changes, nipple discharge, injury, change in routine or skin/laundry products. States increased itching at bedtime. Regular monthly cycle/virgin. Denies any other problems such as vaginal discharge, urinary symptoms, fever or abdominal pain.  Exam: Appears well. Breast exam and sitting and lying position without palpable nodules, retractions or nipple discharge. Right breast several small, nonraised, circular, darker skin pigmented area superior to areola.  Right breast urticaria  Plan: Valisone cream twice daily small amount to right breast. Benadryl 25 mg at bedtime. Instructed to call if symptoms persist. Continue to work out, change out of work out clothes shortly after, keep breasts dry.

## 2017-03-08 ENCOUNTER — Other Ambulatory Visit: Payer: Self-pay | Admitting: Internal Medicine

## 2017-03-28 ENCOUNTER — Telehealth: Payer: Self-pay

## 2017-03-28 NOTE — Telephone Encounter (Signed)
My Chart message sent

## 2017-03-28 NOTE — Telephone Encounter (Signed)
-----   Message from Wanda PlumpJose E Paz, MD sent at 03/26/2017  8:41 PM EDT ----- Regarding: Due for a TSH Send the patient a my-chart message, she is due for a TSH

## 2017-05-18 ENCOUNTER — Encounter: Payer: Self-pay | Admitting: Internal Medicine

## 2017-05-18 DIAGNOSIS — E039 Hypothyroidism, unspecified: Secondary | ICD-10-CM

## 2017-05-19 MED ORDER — LEVOTHYROXINE SODIUM 50 MCG PO TABS: 50.0000 ug | ORAL_TABLET | Freq: Every day | ORAL | 2 refills | Status: DC

## 2017-05-19 NOTE — Telephone Encounter (Signed)
Rx sent. TSH ordered.  °

## 2017-05-19 NOTE — Telephone Encounter (Signed)
See message  Received: Today  Message Contents  Wanda Plump, MD  Conrad Cedar, CMA        Send Synthroid # 30 and 2 refills  Enter a future order, TSH, 6 weeks from today.

## 2017-06-20 ENCOUNTER — Encounter: Payer: 59 | Admitting: Gynecology

## 2017-08-19 ENCOUNTER — Encounter: Payer: Self-pay | Admitting: Internal Medicine

## 2017-08-27 ENCOUNTER — Other Ambulatory Visit: Payer: Self-pay | Admitting: Internal Medicine

## 2017-09-29 ENCOUNTER — Encounter: Payer: 59 | Admitting: Obstetrics & Gynecology

## 2017-10-07 ENCOUNTER — Other Ambulatory Visit: Payer: 59

## 2017-10-07 ENCOUNTER — Other Ambulatory Visit (INDEPENDENT_AMBULATORY_CARE_PROVIDER_SITE_OTHER): Payer: 59

## 2017-10-07 DIAGNOSIS — E039 Hypothyroidism, unspecified: Secondary | ICD-10-CM | POA: Diagnosis not present

## 2017-10-07 LAB — TSH: TSH: 6.49 u[IU]/mL — AB (ref 0.35–4.50)

## 2017-10-10 MED ORDER — LEVOTHYROXINE SODIUM 50 MCG PO TABS: 50.0000 ug | ORAL_TABLET | Freq: Every day | ORAL | 1 refills | Status: DC

## 2017-10-10 NOTE — Addendum Note (Signed)
Addended byConrad [place]: Onnie Hatchel D on: 10/10/2017 07:52 AM   Modules accepted: Orders

## 2017-10-14 ENCOUNTER — Ambulatory Visit: Payer: 59 | Admitting: Obstetrics & Gynecology

## 2017-10-14 ENCOUNTER — Encounter: Payer: Self-pay | Admitting: Obstetrics & Gynecology

## 2017-10-14 VITALS — BP 122/78 | Ht 61.0 in | Wt 141.0 lb

## 2017-10-14 DIAGNOSIS — Z01419 Encounter for gynecological examination (general) (routine) without abnormal findings: Secondary | ICD-10-CM

## 2017-10-14 DIAGNOSIS — Z789 Other specified health status: Secondary | ICD-10-CM

## 2017-10-14 NOTE — Progress Notes (Signed)
    Loretta SachsLisette [name] 07-Oct-1990 [id number]   History:    27 y.o. G0 Single/Virgin.  Lives with her parents.  Works at [employer].  RP:  Established patient presenting for annual gyn exam   HPI: Menses regular normal every month.  Started using tampons without problems.  No breakthrough bleeding.  No pelvic pain.  Normal vaginal secretions.  Virgin.  Breasts normal.  Urine and bowel movements normal.  Received Gardasil.  Past medical history,surgical history, family history and social history were all reviewed and documented in the EPIC chart.  Gynecologic History Patient's last menstrual period was 09/13/2017. Contraception: abstinence Last Pap: 05/2016. Results were: Negative Last mammogram: Never  Obstetric History OB History  Gravida Para Term Preterm AB Living  0            SAB TAB Ectopic Multiple Live Births                    ROS: A ROS was performed and pertinent positives and negatives are included in the history.  GENERAL: No fevers or chills. HEENT: No change in vision, no earache, sore throat or sinus congestion. NECK: No pain or stiffness. CARDIOVASCULAR: No chest pain or pressure. No palpitations. PULMONARY: No shortness of breath, cough or wheeze. GASTROINTESTINAL: No abdominal pain, nausea, vomiting or diarrhea, melena or bright red blood per rectum. GENITOURINARY: No urinary frequency, urgency, hesitancy or dysuria. MUSCULOSKELETAL: No joint or muscle pain, no back pain, no recent trauma. DERMATOLOGIC: No rash, no itching, no lesions. ENDOCRINE: No polyuria, polydipsia, no heat or cold intolerance. No recent change in weight. HEMATOLOGICAL: No anemia or easy bruising or bleeding. NEUROLOGIC: No headache, seizures, numbness, tingling or weakness. PSYCHIATRIC: No depression, no loss of interest in normal activity or change in sleep pattern.     Exam:   BP 122/78   Ht 5\' 1"  (1.549 m)   Wt 141 lb (64 kg)   LMP 09/13/2017 Comment: no birth control  BMI 26.64 kg/m   Body  mass index is 26.64 kg/m.  General appearance : Well developed well nourished female. No acute distress HEENT: Eyes: no retinal hemorrhage or exudates,  Neck supple, trachea midline, no carotid bruits, no thyroidmegaly Lungs: Clear to auscultation, no rhonchi or wheezes, or rib retractions  Heart: Regular rate and rhythm, no murmurs or gallops Breast:Examined in sitting and supine position were symmetrical in appearance, no palpable masses or tenderness,  no skin retraction, no nipple inversion, no nipple discharge, no skin discoloration, no axillary or supraclavicular lymphadenopathy Abdomen: no palpable masses or tenderness, no rebound or guarding Extremities: no edema or skin discoloration or tenderness  Pelvic: Vulva normal  Bartholin, Urethra, Skene Glands: Within normal limits             Vagina: No gross lesions or discharge  Cervix: No gross lesions or discharge.  Pap reflex done.  Virgin speculum used.  Uterus  AV, normal size, shape and consistency, non-tender and mobile  Adnexa  Without masses or tenderness  Anus and perineum  normal    Assessment/Plan:  27 y.o. female for annual exam   1. Encounter for routine gynecological examination with Papanicolaou smear of cervix Normal gynecologic exam. Pap reflex done.  Breast exam normal.  Hypothyroidism on levothyroxine, followed by Dr. Drue NovelPaz. - Pap IG w/ reflex to HPV when ASC-U  2. Uses abstinence for birth control Virgin.  Strict condom use recommended if becomes sexually active.  Genia DelMarie-Lyne Cornell Bourbon MD, 4:17 PM 10/14/2017

## 2017-10-14 NOTE — Patient Instructions (Signed)
1. Encounter for routine gynecological examination with Papanicolaou smear of cervix Normal gynecologic exam. Pap reflex done.  Breast exam normal.  Hypothyroidism on levothyroxine, followed by Dr. Larose Kells. - Pap IG w/ reflex to HPV when ASC-U  2. Uses abstinence for birth control [place].  Strict condom use recommended if becomes sexually active.  Loretta [name], it was a pleasure seeing you today!  I will inform you of your results as soon as they are available.   Preventive Care for [place], Female The transition to life after high school as a young adult can be a stressful time with many changes. You may start seeing a primary care physician instead of a pediatrician. This is the time when your health care becomes your responsibility. Preventive care refers to lifestyle choices and visits with your health care provider that can promote health and wellness. What does preventive care include?  A yearly physical exam. This is also called an annual wellness visit.  Dental exams once or twice a year.  Routine eye exams. Ask your health care provider how often you should have your eyes checked.  Personal lifestyle choices, including: ? Daily care of your teeth and gums. ? Regular physical activity. ? Eating a healthy diet. ? Avoiding tobacco and drug use. ? Avoiding or limiting alcohol use. ? Practicing safe sex. ? Taking vitamin and mineral supplements as recommended by your health care provider. What happens during an annual wellness visit? Preventive care starts with a yearly visit to your primary care physician. The services and screenings done by your health care provider during your annual wellness visit will depend on your overall health, lifestyle risk factors, and family history of disease. Counseling Your health care provider may ask you questions about:  Past medical problems and your family's medical history.  Medicines or supplements you take.  Health insurance and access to  health care.  Alcohol, tobacco, and drug use.  Your safety at home, work, or school.  Access to firearms.  Emotional well-being and how you cope with stress.  Relationship well-being.  Diet, exercise, and sleep habits.  Your sexual health and activity.  Your methods of birth control.  Your menstrual cycle.  Your pregnancy history.  Screening You may have the following tests or measurements:  Height, weight, and BMI.  Blood pressure.  Lipid and cholesterol levels.  Tuberculosis skin test.  Skin exam.  Vision and hearing tests.  Screening test for hepatitis.  Screening tests for sexually transmitted diseases (STDs), if you are at risk.  BRCA-related cancer screening. This may be done if you have a family history of breast, ovarian, tubal, or peritoneal cancers.  Pelvic exam and Pap test. This may be done every 3 years starting at age 80.  Vaccines Your health care provider may recommend certain vaccines, such as:  Influenza vaccine. This is recommended every year.  Tetanus, diphtheria, and acellular pertussis (Tdap, Td) vaccine. You may need a Td booster every 10 years.  Varicella vaccine. You may need this if you have not been vaccinated.  HPV vaccine. If you are 16 or younger, you may need three doses over 6 months.  Measles, mumps, and rubella (MMR) vaccine. You may need at least one dose of MMR. You may also need a second dose.  Pneumococcal 13-valent conjugate (PCV13) vaccine. You may need this if you have certain conditions and were not previously vaccinated.  Pneumococcal polysaccharide (PPSV23) vaccine. You may need one or two doses if you smoke cigarettes or if you  have certain conditions.  Meningococcal vaccine. One dose is recommended if you are age 49-21 years and a first-year college student living in a residence hall, or if you have one of several medical conditions. You may also need additional booster doses.  Hepatitis A vaccine. You may  need this if you have certain conditions or if you travel or work in places where you may be exposed to hepatitis A.  Hepatitis B vaccine. You may need this if you have certain conditions or if you travel or work in places where you may be exposed to hepatitis B.  Haemophilus influenzae type b (Hib) vaccine. You may need this if you have certain risk factors.  Talk to your health care provider about which screenings and vaccines you need and how often you need them. What steps can I take to develop healthy behaviors?  Have regular preventive health care visits with your primary care physician and dentist.  Eat a healthy diet.  Drink enough fluid to keep your urine clear or pale yellow.  Stay active. Exercise at least 30 minutes 5 or more days of the week.  Use alcohol responsibly.  Maintain a healthy weight.  Do not use any products that contain nicotine, such as cigarettes, chewing tobacco, and e-cigarettes. If you need help quitting, ask your health care provider.  Do not use drugs.  Practice safe sex.  Use birth control (contraception) to prevent unwanted pregnancy. If you plan to become pregnant, see your health care provider for a pre-conception visit.  Find healthy ways to manage stress. How can I protect myself from injury? Injuries from violence or accidents are the leading cause of death among young adults and can often be prevented. Take these steps to help protect yourself:  Always wear your seat belt while driving or riding in a vehicle.  Do not drive if you have been drinking alcohol. Do not ride with someone who has been drinking.  Do not drive when you are tired or distracted. Do not text while driving.  Wear a helmet and other protective equipment during sports activities.  If you have firearms in your house, make sure you follow all gun safety procedures.  Seek help if you have been bullied, physically abused, or sexually abused.  Use the Internet  responsibly to avoid dangers such as online bullying and online sexual predators.  What can I do to cope with stress? Young adults may face many new challenges that can be stressful, such as finding a job, going to college, moving away from home, managing money, being in a relationship, getting married, and having children. To manage stress:  Avoid known stressful situations when you can.  Exercise regularly.  Find a stress-reducing activity that works best for you. Examples include meditation, yoga, listening to music, or reading.  Spend time in nature.  Keep a journal to write about your stress and how you respond.  Talk to your health care provider about stress. He or she may suggest counseling.  Spend time with supportive friends or family.  Do not cope with stress by: ? Drinking alcohol or using drugs. ? Smoking cigarettes. ? Eating.  Where can I get more information? Learn more about preventive care and healthy habits from:  [place] and Gynecologists: KaraokeExchange.nl  U.S. Probation officer Task Force: StageSync.si  National Adolescent and [place]: StrategicRoad.nl  American Academy of Pediatrics Bright Futures: https://brightfutures.MemberVerification.co.za  Society for Adolescent Health and Medicine: MoralBlog.co.za.aspx  PodExchange.nl: ToyLending.fr  This information is not intended to replace advice given to you by your health care provider. Make sure you discuss any questions you have with your health care provider. Document Released: 01/29/2016 Document Revised: 02/19/2016 Document Reviewed: 01/29/2016 Elsevier Interactive Patient Education  [employer].

## 2017-10-19 LAB — PAP IG W/ RFLX HPV ASCU

## 2017-11-29 ENCOUNTER — Other Ambulatory Visit: Payer: Self-pay

## 2017-11-29 MED ORDER — LEVOTHYROXINE SODIUM 50 MCG PO TABS: 50.0000 ug | ORAL_TABLET | Freq: Every day | ORAL | 0 refills | Status: DC

## 2017-12-13 ENCOUNTER — Other Ambulatory Visit: Payer: Self-pay | Admitting: Internal Medicine

## 2017-12-13 NOTE — Telephone Encounter (Signed)
Rx denied prescription filled on (11/29/17).//AB/CMA

## 2018-01-09 ENCOUNTER — Other Ambulatory Visit: Payer: Self-pay

## 2018-01-09 MED ORDER — LEVOTHYROXINE SODIUM 50 MCG PO TABS: 50.0000 ug | ORAL_TABLET | Freq: Every day | ORAL | 0 refills | Status: DC

## 2018-01-18 ENCOUNTER — Encounter: Payer: Self-pay | Admitting: Internal Medicine

## 2018-01-18 NOTE — Telephone Encounter (Signed)
Called pt and LVM letting her know that Dr. Drue NovelPaz would like for her to be seen. Advised her to call back and schedule an appt with him or another provider at her convenience .

## 2018-01-18 NOTE — Telephone Encounter (Signed)
Please call Pt- she is having headaches- needs to be seen. PCP full- may need to be seen another day if other providers full.

## 2018-01-20 ENCOUNTER — Other Ambulatory Visit (INDEPENDENT_AMBULATORY_CARE_PROVIDER_SITE_OTHER): Payer: BLUE CROSS/BLUE SHIELD

## 2018-01-20 ENCOUNTER — Ambulatory Visit (INDEPENDENT_AMBULATORY_CARE_PROVIDER_SITE_OTHER): Payer: BLUE CROSS/BLUE SHIELD | Admitting: Family Medicine

## 2018-01-20 ENCOUNTER — Encounter: Payer: Self-pay | Admitting: Family Medicine

## 2018-01-20 VITALS — BP 120/76 | HR 76 | Temp 98.5°F | Ht 61.0 in | Wt 135.5 lb

## 2018-01-20 DIAGNOSIS — G44209 Tension-type headache, unspecified, not intractable: Secondary | ICD-10-CM

## 2018-01-20 DIAGNOSIS — E039 Hypothyroidism, unspecified: Secondary | ICD-10-CM | POA: Diagnosis not present

## 2018-01-20 LAB — TSH: TSH: 4.76 u[IU]/mL — AB (ref 0.35–4.50)

## 2018-01-20 NOTE — Progress Notes (Signed)
Chief Complaint  Patient presents with  . Headache    constant    Loretta [name] is a 27 y.o. female here for evaluation of right headache.  Duration: 2 weeks Palliation: Tylenol, ibuprofen, sleep Provocation: No known factors Severity: 5 Quality: Pinching  Associated symptoms: some neck pain, does chew a lot of gum Denies: Neurologic s/s's, jaw pain, injury, nausea, vomiting, sonophobia, photophobia Currently with headache? Yes Failed therapies: none  Past Medical History:  Diagnosis Date  . ACL (anterior cruciate ligament) tear   . ACL tear 11/2014   left  . Asthma 03/27/2012  . Headache(784.0)    stress/migraine  . History of cardiac murmur    "innocent murmur" 2011 - states has resolved  . History of strabismus   . Hypothyroidism    has not taken medication since 09/2014  . Neurofibromatosis, type 1 (von Recklinghausen's disease) (HCC)   . Seasonal asthma    prn inhaler   Family History  Problem Relation Age of Onset  . Diabetes Maternal Grandmother   . Colon cancer Neg Hx   . Breast cancer Neg Hx    Current Meds  Medication Sig  . Ascorbic Acid (VITAMIN C PO) Take 1 tablet by mouth daily.  Marland Kitchen. levothyroxine (SYNTHROID, LEVOTHROID) 50 MCG tablet Take 1 tablet (50 mcg total) by mouth daily before breakfast.  . Omega-3 Fatty Acids (FISH OIL PO) Take by mouth.    BP 120/76 (BP Location: Left Arm, Patient Position: Sitting, Cuff Size: Normal)   Pulse 76   Temp 98.5 F (36.9 C) (Oral)   Ht 5\' 1"  (1.549 m)   Wt 135 lb 8 oz (61.5 kg)   SpO2 98%   BMI 25.60 kg/m  General: awake, alert, appearing stated age Eyes: PERRLA, EOMi Lungs: no accessory muscle use Neuro: CN 2-12 intact, no cerebellar signs, DTR's equal and symmetry, no clonus MSK: 5/5 strength throughout, normal gait, +TTP over R posterior cervical triangle, no ttp over paraspinal cervical musculature, no jaw clicking or ttp Psych: Age appropriate judgment and insight, mood and affect normal  Tension  headache  Cont ibuprofen/tylenol. Stretches/exercises for neck. Monitor gum use. Seems tension related given ttp/tightness over neck muscles. No red flag s/s's. Taught subocc release, to have mom/dad do it. Follow up in 4 weeks if needed. The patient voiced understanding and agreement to the plan.  Jilda Rocheicholas Paul StanleyWendling, OhioDO 9:25 AM 01/20/18

## 2018-01-20 NOTE — Progress Notes (Signed)
Pre visit review using our clinic review tool, if applicable. No additional management support is needed unless otherwise documented below in the visit note. 

## 2018-01-20 NOTE — Patient Instructions (Signed)
Heat (pad or rice pillow in microwave) over affected area, 10-15 minutes twice daily.   Have your parents massage that area in the "suboccipital triangle".  Try to back off on gum chewing.   EXERCISES RANGE OF MOTION (ROM) AND STRETCHING EXERCISES  These exercises may help you when beginning to rehabilitate your issue. In order to successfully resolve your symptoms, you must improve your posture. These exercises are designed to help reduce the forward-head and rounded-shoulder posture which contributes to this condition. Your symptoms may resolve with or without further involvement from your physician, physical therapist or athletic trainer. While completing these exercises, remember:   Restoring tissue flexibility helps normal motion to return to the joints. This allows healthier, less painful movement and activity.  An effective stretch should be held for at least 20 seconds, although you may need to begin with shorter hold times for comfort.  A stretch should never be painful. You should only feel a gentle lengthening or release in the stretched tissue.  Do not do any stretch or exercise that you cannot tolerate.  STRETCH- Axial Extensors  Lie on your back on the floor. You may bend your knees for comfort. Place a rolled-up hand towel or dish towel, about 2 inches in diameter, under the part of your head that makes contact with the floor.  Gently tuck your chin, as if trying to make a "double chin," until you feel a gentle stretch at the base of your head.  Hold 15-20 seconds. Repeat 2-3 times. Complete this exercise 1 time per day.   STRETCH - Axial Extension   Stand or sit on a firm surface. Assume a good posture: chest up, shoulders drawn back, abdominal muscles slightly tense, knees unlocked (if standing) and feet hip width apart.  Slowly retract your chin so your head slides back and your chin slightly lowers. Continue to look straight ahead.  You should feel a gentle stretch  in the back of your head. Be certain not to feel an aggressive stretch since this can cause headaches later.  Hold for 15-20 seconds. Repeat 2-3 times. Complete this exercise 1 time per day.  STRETCH - Cervical Side Bend   Stand or sit on a firm surface. Assume a good posture: chest up, shoulders drawn back, abdominal muscles slightly tense, knees unlocked (if standing) and feet hip width apart.  Without letting your nose or shoulders move, slowly tip your right / left ear to your shoulder until your feel a gentle stretch in the muscles on the opposite side of your neck.  Hold 15-20 seconds. Repeat 2-3 times. Complete this exercise 1-2 times per day.  STRETCH - Cervical Rotators   Stand or sit on a firm surface. Assume a good posture: chest up, shoulders drawn back, abdominal muscles slightly tense, knees unlocked (if standing) and feet hip width apart.  Keeping your eyes level with the ground, slowly turn your head until you feel a gentle stretch along the back and opposite side of your neck.  Hold 15-20 seconds. Repeat 2-3 times. Complete this exercise 1-2 times per day.  RANGE OF MOTION - Neck Circles   Stand or sit on a firm surface. Assume a good posture: chest up, shoulders drawn back, abdominal muscles slightly tense, knees unlocked (if standing) and feet hip width apart.  Gently roll your head down and around from the back of one shoulder to the back of the other. The motion should never be forced or painful.  Repeat the motion 10-20  times, or until you feel the neck muscles relax and loosen. Repeat 2-3 times. Complete the exercise 1-2 times per day. STRENGTHENING EXERCISES - Cervical Strain and Sprain These exercises may help you when beginning to rehabilitate your injury. They may resolve your symptoms with or without further involvement from your physician, physical therapist, or athletic trainer. While completing these exercises, remember:   Muscles can gain both the  endurance and the strength needed for everyday activities through controlled exercises.  Complete these exercises as instructed by your physician, physical therapist, or athletic trainer. Progress the resistance and repetitions only as guided.  You may experience muscle soreness or fatigue, but the pain or discomfort you are trying to eliminate should never worsen during these exercises. If this pain does worsen, stop and make certain you are following the directions exactly. If the pain is still present after adjustments, discontinue the exercise until you can discuss the trouble with your clinician.  STRENGTH - Cervical Flexors, Isometric  Face a wall, standing about 6 inches away. Place a small pillow, a ball about 6-8 inches in diameter, or a folded towel between your forehead and the wall.  Slightly tuck your chin and gently push your forehead into the soft object. Push only with mild to moderate intensity, building up tension gradually. Keep your jaw and forehead relaxed.  Hold 10 to 20 seconds. Keep your breathing relaxed.  Release the tension slowly. Relax your neck muscles completely before you start the next repetition. Repeat 2-3 times. Complete this exercise 1 time per day.  STRENGTH- Cervical Lateral Flexors, Isometric   Stand about 6 inches away from a wall. Place a small pillow, a ball about 6-8 inches in diameter, or a folded towel between the side of your head and the wall.  Slightly tuck your chin and gently tilt your head into the soft object. Push only with mild to moderate intensity, building up tension gradually. Keep your jaw and forehead relaxed.  Hold 10 to 20 seconds. Keep your breathing relaxed.  Release the tension slowly. Relax your neck muscles completely before you start the next repetition. Repeat 2-3 times. Complete this exercise 1 time per day.  STRENGTH - Cervical Extensors, Isometric   Stand about 6 inches away from a wall. Place a small pillow, a  ball about 6-8 inches in diameter, or a folded towel between the back of your head and the wall.  Slightly tuck your chin and gently tilt your head back into the soft object. Push only with mild to moderate intensity, building up tension gradually. Keep your jaw and forehead relaxed.  Hold 10 to 20 seconds. Keep your breathing relaxed.  Release the tension slowly. Relax your neck muscles completely before you start the next repetition. Repeat 2-3 times. Complete this exercise 1 time per day.  POSTURE AND BODY MECHANICS CONSIDERATIONS Keeping correct posture when sitting, standing or completing your activities will reduce the stress put on different body tissues, allowing injured tissues a chance to heal and limiting painful experiences. The following are general guidelines for improved posture. Your physician or physical therapist will provide you with any instructions specific to your needs. While reading these guidelines, remember:  The exercises prescribed by your provider will help you have the flexibility and strength to maintain correct postures.  The correct posture provides the optimal environment for your joints to work. All of your joints have less wear and tear when properly supported by a spine with good posture. This means you will experience  a healthier, less painful body.  Correct posture must be practiced with all of your activities, especially prolonged sitting and standing. Correct posture is as important when doing repetitive low-stress activities (typing) as it is when doing a single heavy-load activity (lifting).  PROLONGED STANDING WHILE SLIGHTLY LEANING FORWARD When completing a task that requires you to lean forward while standing in one place for a long time, place either foot up on a stationary 2- to 4-inch high object to help maintain the best posture. When both feet are on the ground, the low back tends to lose its slight inward curve. If this curve flattens (or becomes  too large), then the back and your other joints will experience too much stress, fatigue more quickly, and can cause pain.   RESTING POSITIONS Consider which positions are most painful for you when choosing a resting position. If you have pain with flexion-based activities (sitting, bending, stooping, squatting), choose a position that allows you to rest in a less flexed posture. You would want to avoid curling into a fetal position on your side. If your pain worsens with extension-based activities (prolonged standing, working overhead), avoid resting in an extended position such as sleeping on your stomach. Most people will find more comfort when they rest with their spine in a more neutral position, neither too rounded nor too arched. Lying on a non-sagging bed on your side with a pillow between your knees, or on your back with a pillow under your knees will often provide some relief. Keep in mind, being in any one position for a prolonged period of time, no matter how correct your posture, can still lead to stiffness.  WALKING Walk with an upright posture. Your ears, shoulders, and hips should all line up. OFFICE WORK When working at a desk, create an environment that supports good, upright posture. Without extra support, muscles fatigue and lead to excessive strain on joints and other tissues.  CHAIR:  A chair should be able to slide under your desk when your back makes contact with the back of the chair. This allows you to work closely.  The chair's height should allow your eyes to be level with the upper part of your monitor and your hands to be slightly lower than your elbows.  Body position: ? Your feet should make contact with the floor. If this is not possible, use a foot rest. ? Keep your ears over your shoulders. This will reduce stress on your neck and low back.

## 2018-01-24 MED ORDER — LEVOTHYROXINE SODIUM 75 MCG PO TABS: 75.0000 ug | ORAL_TABLET | Freq: Every day | ORAL | 3 refills | Status: DC

## 2018-01-24 NOTE — Addendum Note (Signed)
Addended byConrad [place] D on: 01/24/2018 08:32 AM   Modules accepted: Orders

## 2018-01-28 ENCOUNTER — Encounter: Payer: Self-pay | Admitting: Internal Medicine

## 2018-02-08 ENCOUNTER — Encounter: Payer: Self-pay | Admitting: Internal Medicine

## 2018-02-09 ENCOUNTER — Ambulatory Visit (INDEPENDENT_AMBULATORY_CARE_PROVIDER_SITE_OTHER): Payer: BLUE CROSS/BLUE SHIELD | Admitting: Medical

## 2018-02-09 ENCOUNTER — Encounter: Payer: Self-pay | Admitting: Medical

## 2018-02-09 VITALS — BP 111/54 | HR 72 | Temp 97.9°F | Resp 16 | Ht 61.0 in | Wt 139.0 lb

## 2018-02-09 DIAGNOSIS — H6123 Impacted cerumen, bilateral: Secondary | ICD-10-CM | POA: Diagnosis not present

## 2018-02-09 NOTE — Patient Instructions (Signed)
You did have bilateral cerumen impaction.  Canals were successfully cleared/lavaged today.  By your history you report lavage 2 years ago for wax obstruction.  Would recommend in the future that if you start to feel ears plugging again then get Debrox over-the-counter and use for 3 to 4 days.  Then you could schedule appointment at that time and ears could be washed out again.  Follow-up as really schedule with your PCP or as needed.

## 2018-02-09 NOTE — Progress Notes (Signed)
Subjective:    Patient ID: Loretta [name], female    DOB: 03-20-1991, 27 y.o.   MRN: [id number]  HPI  Left ear pain since beginning of the week.  Denies any nasal congestion or sneezing.   Pt states mild irritation but some decreased hearing. Also slight on and off ringing to ear. Some slight itching.  No fevers, chills or sweats.  No history or ear infection. Maybe rare swimmers ear. She can't remember.  Pt had wax washed out 2 years ago in [place].   Review of Systems  Constitutional: Negative for chills, fatigue and fever.  HENT: Negative for congestion, nosebleeds, postnasal drip, rhinorrhea and sinus pain.        See HPI  Respiratory: Negative for cough, chest tightness, shortness of breath and wheezing.   Cardiovascular: Negative for chest pain and palpitations.  Neurological: Negative for dizziness, speech difficulty, weakness and headaches.  Hematological: Negative for adenopathy. Does not bruise/bleed easily.   Past Medical History:  Diagnosis Date  . ACL (anterior cruciate ligament) tear   . ACL tear 11/2014   left  . Asthma 03/27/2012  . Headache(784.0)    stress/migraine  . History of cardiac murmur    "innocent murmur" 2011 - states has resolved  . History of strabismus   . Hypothyroidism    has not taken medication since 09/2014  . Neurofibromatosis, type 1 (von Recklinghausen's disease) (HCC)   . Seasonal asthma    prn inhaler     Social History   Socioeconomic History  . Marital status: Single    Spouse name: Not on file  . Number of children: 0  . Years of education: College  . Highest education level: Not on file  Occupational History  . Occupation: Architect, works part time   Engineer, production  . Financial resource strain: Not on file  . Food insecurity:    Worry: Not on file    Inability: Not on file  . Transportation needs:    Medical: Not on file    Non-medical: Not on file  Tobacco Use  . Smoking status: Never Smoker  .  Smokeless tobacco: Never Used  Substance and Sexual Activity  . Alcohol use: Yes    Comment: social  . Drug use: No  . Sexual activity: Never    Comment: [place]  Lifestyle  . Physical activity:    Days per week: Not on file    Minutes per session: Not on file  . Stress: Not on file  Relationships  . Social connections:    Talks on phone: Not on file    Gets together: Not on file    Attends religious service: Not on file    Active member of club or organization: Not on file    Attends meetings of clubs or organizations: Not on file    Relationship status: Not on file  . Intimate partner violence:    Fear of current or ex partner: Not on file    Emotionally abused: Not on file    Physically abused: Not on file    Forced sexual activity: Not on file  Other Topics Concern  . Not on file  Social History Narrative   G0P0   Patient lives at home with her parents.    Left handed.    Nancy Fetter [birth year]    Past Surgical History:  Procedure Laterality Date  . ANTERIOR CRUCIATE LIGAMENT REPAIR Left scheduled April 2016   Hamstring graft   . STRABISMUS  SURGERY     age 17  . STRABISMUS SURGERY Right 03/01/2014   Procedure: REPAIR STRABISMUS BILATERAL;  Surgeon: Shara Blazing, MD;  Location: [facility];  Service: Ophthalmology;  Laterality: Right;  . TUMOR EXCISION Right as an infant   lower eyelid    Family History  Problem Relation Age of Onset  . Diabetes Maternal Grandmother   . Colon cancer Neg Hx   . Breast cancer Neg Hx     No Known Allergies  Current Outpatient Medications on File Prior to Visit  Medication Sig Dispense Refill  . Ascorbic Acid (VITAMIN C PO) Take 1 tablet by mouth daily.    Marland Kitchen levothyroxine (SYNTHROID, LEVOTHROID) 50 MCG tablet Take 1 tablet (50 mcg total) by mouth daily before breakfast.    . Omega-3 Fatty Acids (FISH OIL PO) Take by mouth.     No current facility-administered medications on file prior to visit.     BP (!)  111/54 (BP Location: Right Arm, Patient Position: Sitting, Cuff Size: Normal)   Pulse 72   Temp 97.9 F (36.6 C) (Oral)   Resp 16   Ht  (1.549 m)   Wt 139 lb (63 kg)   SpO2 100%   BMI 26.26 kg/m       Objective:   Physical Exam  General  Mental Status - Alert. General Appearance - Well groomed. Not in acute distress.(Post lavage of years patient states her symptoms are resolved.)  Skin Rashes- No Rashes.  HEENT Head- Normal. Ear Auditory Canal - Left-moderate -severe cerumen impaction.  Wax appears very dry and I cannot see any portion of the tympanic membrane. Rt- Moderate-severe cerumen impaction.  TM dry wax and cannot see any portion of the TM (post lavage of ear canals show approximate 100% of wax cleared.  TMs look normal.)    Eye Sclera/Conjunctiva- Left- Normal. Right- Normal. Nose & Sinuses Nasal Mucosa- Left-  Boggy and Congested. Right-  Boggy and  Congested.Bilateral maxillary and frontal sinus pressure. Mouth & Throat Lips: Upper Lip- Normal: no dryness, cracking, pallor, cyanosis, or vesicular eruption. Lower Lip-Normal: no dryness, cracking, pallor, cyanosis or vesicular eruption. Buccal Mucosa- Bilateral- No Aphthous ulcers. Oropharynx- No Discharge or Erythema. Tonsils: Characteristics- Bilateral- No Erythema or Congestion. Size/Enlargement- Bilateral- No enlargement. Discharge- bilateral-None.  Neck Neck- Supple. No Masses.   Chest and Lung Exam Auscultation: Breath Sounds:-Clear even and unlabored.  Cardiovascular Auscultation:Rythm- Regular, rate and rhythm. Murmurs & Other Heart Sounds:Ausculatation of the heart reveal- No Murmurs.  Lymphatic Head & Neck General Head & Neck Lymphatics: Bilateral: Description- No Localized lymphadenopathy.       Assessment & Plan:  You did have bilateral cerumen impaction.  Canals were successfully cleared/lavaged today.  By your history you report lavage 2 years ago for wax obstruction.  Would  recommend in the future that if you start to feel ears plugging again then get Debrox over-the-counter and use for 3 to 4 days.  Then you could schedule appointment at that time and ears could be washed out again.  Follow-up as really schedule with your PCP or as needed.  Esperanza Richters, PA-C

## 2018-02-27 ENCOUNTER — Ambulatory Visit (HOSPITAL_BASED_OUTPATIENT_CLINIC_OR_DEPARTMENT_OTHER)
Admission: RE | Admit: 2018-02-27 | Discharge: 2018-02-27 | Disposition: A | Discharge status: Home / Self Care | Payer: BLUE CROSS/BLUE SHIELD | Source: Ambulatory Visit | Attending: Internal Medicine | Admitting: Internal Medicine

## 2018-02-27 ENCOUNTER — Encounter: Payer: Self-pay | Admitting: Internal Medicine

## 2018-02-27 ENCOUNTER — Ambulatory Visit (INDEPENDENT_AMBULATORY_CARE_PROVIDER_SITE_OTHER): Payer: BLUE CROSS/BLUE SHIELD | Admitting: Internal Medicine

## 2018-02-27 VITALS — BP 124/74 | HR 69 | Temp 97.9°F | Resp 16 | Ht 61.0 in | Wt 142.1 lb

## 2018-02-27 DIAGNOSIS — M25572 Pain in left ankle and joints of left foot: Secondary | ICD-10-CM | POA: Diagnosis not present

## 2018-02-27 DIAGNOSIS — E039 Hypothyroidism, unspecified: Secondary | ICD-10-CM

## 2018-02-27 DIAGNOSIS — W19XXXA Unspecified fall, initial encounter: Secondary | ICD-10-CM

## 2018-02-27 DIAGNOSIS — M7989 Other specified soft tissue disorders: Secondary | ICD-10-CM | POA: Diagnosis not present

## 2018-02-27 LAB — TSH: TSH: 4.52 u[IU]/mL — ABNORMAL HIGH (ref 0.35–4.50)

## 2018-02-27 NOTE — Progress Notes (Signed)
Pre visit review using our clinic review tool, if applicable. No additional management support is needed unless otherwise documented below in the visit note. 

## 2018-02-27 NOTE — Patient Instructions (Signed)
GO TO THE LAB : Get the blood work     GO TO THE FRONT DESK Schedule your next appointment for a blood test in 2 months   STOP BY THE FIRST FLOOR:  get the XR     ICE  IBUPROFEN (Advil or Motrin) 200 mg 2 tablets every 6 hours as needed for pain.  Always take it with food because may cause gastritis and ulcers.  If you notice nausea, stomach pain, change in the color of stools --->  Stop the medicine and let us know  Call if not gradually better

## 2018-02-27 NOTE — Assessment & Plan Note (Signed)
Ankle sprain: Rule out fracture, get x-ray.  Otherwise recommend use crutches which she has at home, ice, ibuprofen with GI precautions, call if not improving.  Work excuse given, she does a lot of walking at work. Hypothyroidism: Last TSH elevated, was recommended to increase Synthroid dose however she is still taking 50 mcg.  Check a TSH

## 2018-02-27 NOTE — Progress Notes (Signed)
Subjective:    Patient ID: Loretta [name], female    DOB: [birth year], 27 y.o.   MRN: [id number]  DOS:  02/27/2018 Type of visit - description : Acute visit, here with his mother Interval history: Yesterday she twisted her left ankle when she was exiting her house. No other injuries. Immediate swelling and pain developed, currently pain is 5/10. She is putting ice and Ace wrap.  Hypothyroidism: Still taking Synthroid 50 mcg daily.  Review of Systems Denies other injuries such as head or neck pain. Denies any distal numbness on the left foot  Past Medical History:  Diagnosis Date  . ACL tear 11/2014   left  . Asthma 03/27/2012  . Headache(784.0)    stress/migraine  . History of cardiac murmur    "innocent murmur" 2011 - states has resolved  . History of strabismus   . Hypothyroidism    has not taken medication since 09/2014  . Neurofibromatosis, type 1 (von Recklinghausen's disease) (HCC)   . Seasonal asthma    prn inhaler    Past Surgical History:  Procedure Laterality Date  . ANTERIOR CRUCIATE LIGAMENT REPAIR Left scheduled April 2016   Hamstring graft   . STRABISMUS SURGERY     age 27  . STRABISMUS SURGERY Right 03/01/2014   Procedure: REPAIR STRABISMUS BILATERAL;  Surgeon: Shara BlazingWilliam O Young, MD;  Location: [facility];  Service: Ophthalmology;  Laterality: Right;  . TUMOR EXCISION Right as an infant   lower eyelid    Social History   Socioeconomic History  . Marital status: Single    Spouse name: Not on file  . Number of children: 0  . Years of education: College  . Highest education level: Not on file  Occupational History  . Occupation: Architectrecreational therapist, works part time   Engineer, productionocial Needs  . Financial resource strain: Not on file  . Food insecurity:    Worry: Not on file    Inability: Not on file  . Transportation needs:    Medical: Not on file    Non-medical: Not on file  Tobacco Use  . Smoking status: Never Smoker  . Smokeless tobacco:  Never Used  Substance and Sexual Activity  . Alcohol use: Yes    Comment: social  . Drug use: No  . Sexual activity: Never    Comment: [employer]  Lifestyle  . Physical activity:    Days per week: Not on file    Minutes per session: Not on file  . Stress: Not on file  Relationships  . Social connections:    Talks on phone: Not on file    Gets together: Not on file    Attends religious service: Not on file    Active member of club or organization: Not on file    Attends meetings of clubs or organizations: Not on file    Relationship status: Not on file  . Intimate partner violence:    Fear of current or ex partner: Not on file    Emotionally abused: Not on file    Physically abused: Not on file    Forced sexual activity: Not on file  Other Topics Concern  . Not on file  Social History Narrative   G0P0   Patient lives at home with her parents.    Left handed.    Finish [employer] [id number]      Allergies as of 02/27/2018   No Known Allergies     Medication List  Accurate as of 02/27/18 10:27 AM. Always use your most recent med list.          FISH OIL PO Take by mouth.   levothyroxine 50 MCG tablet Commonly known as:  SYNTHROID, LEVOTHROID Take 1 tablet (50 mcg total) by mouth daily before breakfast.   VITAMIN C PO Take 1 tablet by mouth daily.          Objective:   Physical Exam  Musculoskeletal:       Feet:   BP 124/74 (BP Location: Right Arm, Patient Position: Sitting, Cuff Size: Small)   Pulse 69   Temp 97.9 F (36.6 C) (Oral)   Resp 16   Ht 5\' 1"  (1.549 m)   Wt 142 lb 2 oz (64.5 kg)   LMP 01/28/2018 (Exact Date)   SpO2 98%   BMI 26.85 kg/m  General:   Well developed, well nourished . NAD.  HEENT:  Normocephalic . Face symmetric, atraumatic Skin: Not pale. Not jaundice Neurologic:  alert & oriented X3.  Speech normal, gait unassisted, limited by pain. Psych--  Cognition and judgment appear intact.  Cooperative with normal attention span  and concentration.  Behavior appropriate. No anxious or depressed appearing.      Assessment & Plan:  Assessment Hypothyroidism  GERD H/o asthma , prn albuterol, seasonal  Neurofibromatoses   Plan: Ankle sprain: Rule out fracture, get x-ray.  Otherwise recommend use crutches which she has at home, ice, ibuprofen with GI precautions, call if not improving.  Work excuse given, she does a lot of walking at work. Hypothyroidism: Last TSH elevated, was recommended to increase Synthroid dose however she is still taking 50 mcg.  Check a TSH

## 2018-03-01 ENCOUNTER — Other Ambulatory Visit: Payer: Self-pay

## 2018-03-01 DIAGNOSIS — E039 Hypothyroidism, unspecified: Secondary | ICD-10-CM

## 2018-03-01 MED ORDER — LEVOTHYROXINE SODIUM 88 MCG PO TABS
88.0000 ug | ORAL_TABLET | Freq: Every day | ORAL | 3 refills | Status: DC
Start: 2018-03-01 — End: 2018-04-20

## 2018-03-01 NOTE — Progress Notes (Signed)
syn

## 2018-03-01 NOTE — Progress Notes (Signed)
t

## 2018-04-20 ENCOUNTER — Other Ambulatory Visit: Payer: Self-pay | Admitting: Internal Medicine

## 2018-04-20 DIAGNOSIS — E039 Hypothyroidism, unspecified: Secondary | ICD-10-CM

## 2018-04-28 ENCOUNTER — Other Ambulatory Visit (INDEPENDENT_AMBULATORY_CARE_PROVIDER_SITE_OTHER): Payer: BLUE CROSS/BLUE SHIELD

## 2018-04-28 DIAGNOSIS — E039 Hypothyroidism, unspecified: Secondary | ICD-10-CM | POA: Diagnosis not present

## 2018-04-28 LAB — TSH: TSH: 3.89 u[IU]/mL (ref 0.35–4.50)

## 2018-05-01 MED ORDER — LEVOTHYROXINE SODIUM 88 MCG PO TABS: 88.0000 ug | ORAL_TABLET | Freq: Every day | ORAL | 1 refills | Status: DC

## 2018-05-01 NOTE — Addendum Note (Signed)
Addended byConrad Martin: Marckus [place] D on: 05/01/2018 08:32 AM   Modules accepted: Orders

## 2018-06-20 ENCOUNTER — Encounter: Payer: 59 | Admitting: Gynecology

## 2018-07-05 ENCOUNTER — Ambulatory Visit: Payer: BLUE CROSS/BLUE SHIELD | Admitting: Women's Health

## 2018-07-05 ENCOUNTER — Ambulatory Visit (INDEPENDENT_AMBULATORY_CARE_PROVIDER_SITE_OTHER): Payer: BLUE CROSS/BLUE SHIELD | Admitting: Internal Medicine

## 2018-07-05 ENCOUNTER — Encounter: Payer: Self-pay | Admitting: Internal Medicine

## 2018-07-05 ENCOUNTER — Ambulatory Visit: Payer: BLUE CROSS/BLUE SHIELD | Admitting: Internal Medicine

## 2018-07-05 VITALS — BP 132/70 | HR 69 | Temp 97.8°F | Resp 16 | Ht 61.0 in | Wt 144.5 lb

## 2018-07-05 DIAGNOSIS — J4521 Mild intermittent asthma with (acute) exacerbation: Secondary | ICD-10-CM | POA: Diagnosis not present

## 2018-07-05 DIAGNOSIS — L0291 Cutaneous abscess, unspecified: Secondary | ICD-10-CM | POA: Diagnosis not present

## 2018-07-05 MED ORDER — ALBUTEROL SULFATE HFA 108 (90 BASE) MCG/ACT IN AERS: 2.0000 | INHALATION_SPRAY | Freq: Four times a day (QID) | RESPIRATORY_TRACT | 1 refills | Status: DC | PRN

## 2018-07-05 MED ORDER — CEFUROXIME AXETIL 500 MG PO TABS: 500.0000 mg | ORAL_TABLET | Freq: Two times a day (BID) | ORAL | 0 refills | Status: DC

## 2018-07-05 NOTE — Progress Notes (Signed)
Subjective:    Patient ID: Loretta [name], female    DOB: 03-16-1991, 27 y.o.   MRN: [id number]  DOS:  07/05/2018 Type of visit - description : acute Interval history:  Symptoms of started about 10 days ago: Cough, mostly dry, she has a history of asthma, has no hear any wheezing but has some chest congestion. Cough is triggered by deep breathing or talking. Not taking albuterol  Also, yesterday noted a lump at the right axillary area.  Review of Systems No fever chills No sinus pain or nasal  Discharge No arm pain or swelling    Past Medical History:  Diagnosis Date  . ACL tear 11/2014   left  . Asthma 03/27/2012  . Headache(784.0)    stress/migraine  . History of cardiac murmur    "innocent murmur" 2011 - states has resolved  . History of strabismus   . Hypothyroidism    has not taken medication since 09/2014  . Neurofibromatosis, type 1 (von Recklinghausen's disease) (HCC)   . Seasonal asthma    prn inhaler    Past Surgical History:  Procedure Laterality Date  . ANTERIOR CRUCIATE LIGAMENT REPAIR Left scheduled April 2016   Hamstring graft   . STRABISMUS SURGERY     age 6  . STRABISMUS SURGERY Right 03/01/2014   Procedure: REPAIR STRABISMUS BILATERAL;  Surgeon: Shara Blazing, MD;  Location: [facility];  Service: Ophthalmology;  Laterality: Right;  . TUMOR EXCISION Right as an infant   lower eyelid    Social History   Socioeconomic History  . Marital status: Single    Spouse name: Not on file  . Number of children: 0  . Years of education: College  . Highest education level: Not on file  Occupational History  . Occupation: Architect, works part time   Engineer, production  . Financial resource strain: Not on file  . Food insecurity:    Worry: Not on file    Inability: Not on file  . Transportation needs:    Medical: Not on file    Non-medical: Not on file  Tobacco Use  . Smoking status: Never Smoker  . Smokeless tobacco: Never  Used  Substance and Sexual Activity  . Alcohol use: Yes    Comment: social  . Drug use: No  . Sexual activity: Never    Comment: VIRGIN  Lifestyle  . Physical activity:    Days per week: Not on file    Minutes per session: Not on file  . Stress: Not on file  Relationships  . Social connections:    Talks on phone: Not on file    Gets together: Not on file    Attends religious service: Not on file    Active member of club or organization: Not on file    Attends meetings of clubs or organizations: Not on file    Relationship status: Not on file  . Intimate partner violence:    Fear of current or ex partner: Not on file    Emotionally abused: Not on file    Physically abused: Not on file    Forced sexual activity: Not on file  Other Topics Concern  . Not on file  Social History Narrative   G0P0   Patient lives at home with her parents.    Left handed.    Finish [employer] [id number]      Allergies as of 07/05/2018   No Known Allergies     Medication List  Accurate as of 07/05/18 11:59 PM. Always use your most recent med list.          albuterol 108 (90 Base) MCG/ACT inhaler Commonly known as:  PROVENTIL HFA;VENTOLIN HFA Inhale 2 puffs into the lungs every 6 (six) hours as needed for wheezing or shortness of breath.   cefUROXime 500 MG tablet Commonly known as:  CEFTIN Take 1 tablet (500 mg total) by mouth 2 (two) times daily with a meal.   FISH OIL PO Take by mouth.   levothyroxine 88 MCG tablet Commonly known as:  SYNTHROID, LEVOTHROID Take 1 tablet (88 mcg total) by mouth daily before breakfast.   VITAMIN C PO Take 1 tablet by mouth daily.          Objective:   Physical Exam BP 132/70 (BP Location: Left Arm, Patient Position: Sitting, Cuff Size: Small)   Pulse 69   Temp 97.8 F (36.6 C) (Oral)   Resp 16   Ht 5\' 1"  (1.549 m)   Wt 144 lb 8 oz (65.5 kg)   LMP 06/11/2018 (Approximate)   SpO2 98%   BMI 27.30 kg/m  General:   Well developed, NAD,  see BMI.  HEENT:  Normocephalic . Face symmetric, atraumatic.  TMs normal, nose is slightly congested, throat symmetric, no red. Lungs:  Slightly increased expiratory time, frequent cough noted.  No actual wheezing. Normal respiratory effort, no intercostal retractions, no accessory muscle use. Heart: RRR,  no murmur.  No pretibial edema bilaterally  Skin: On the right axillary area, she has a 1 cm, superficial, tender nodule with a small opening, some white material is already protruding.  I was able to obtain half cc of fatty and pus material.  Some blood. Neurologic:  alert & oriented X3.  Speech normal, gait appropriate for age and unassisted Psych--  Cognition and judgment appear intact.  Cooperative with normal attention span and concentration.  Behavior appropriate. No anxious or depressed appearing.      Assessment & Plan:   Assessment Hypothyroidism  GERD H/o asthma , prn albuterol, seasonal  Neurofibromatoses   Plan: Mild asthma exacerbation: Suspect respiratory symptoms are due to bronchitis with mild asthma exacerbation.  Recommend Ceftin, Mucinex, refill albuterol, call if not better. Infected sebaceous cyst: it was easily drained with some pressure, cystic material sent for culture.  On antibiotics.  Call if not better.

## 2018-07-05 NOTE — Progress Notes (Signed)
Pre visit review using our clinic review tool, if applicable. No additional management support is needed unless otherwise documented below in the visit note. 

## 2018-07-05 NOTE — Patient Instructions (Addendum)
  Rest, fluids , tylenol  For cough:  Take Mucinex DM twice a day as needed until better Also albuterol for wheezing or persistent cough   If  nasal congestion: Use OTC Nasocort or Flonase : 2 nasal sprays on each side of the nose in the morning until you feel better    Take the antibiotic as prescribed    Call if not gradually better over the next  10 days  Call anytime if the symptoms are severe

## 2018-07-06 NOTE — Assessment & Plan Note (Signed)
Mild asthma exacerbation: Suspect respiratory symptoms are due to bronchitis with mild asthma exacerbation.  Recommend Ceftin, Mucinex, refill albuterol, call if not better. Infected sebaceous cyst: it was easily drained with some pressure, cystic material sent for culture.  On antibiotics.  Call if not better.

## 2018-07-08 LAB — WOUND CULTURE
MICRO NUMBER:: 91214538
SPECIMEN QUALITY: ADEQUATE

## 2018-07-10 ENCOUNTER — Ambulatory Visit: Payer: BLUE CROSS/BLUE SHIELD | Admitting: Women's Health

## 2018-07-31 ENCOUNTER — Ambulatory Visit: Payer: BLUE CROSS/BLUE SHIELD | Admitting: Women's Health

## 2018-08-31 ENCOUNTER — Other Ambulatory Visit: Payer: Self-pay | Admitting: Internal Medicine

## 2018-08-31 DIAGNOSIS — E039 Hypothyroidism, unspecified: Secondary | ICD-10-CM

## 2018-10-16 ENCOUNTER — Ambulatory Visit (INDEPENDENT_AMBULATORY_CARE_PROVIDER_SITE_OTHER): Payer: BLUE CROSS/BLUE SHIELD | Admitting: Internal Medicine

## 2018-10-16 ENCOUNTER — Encounter: Payer: Self-pay | Admitting: Internal Medicine

## 2018-10-16 VITALS — BP 116/72 | HR 76 | Temp 98.2°F | Resp 16 | Ht 61.0 in | Wt 145.4 lb

## 2018-10-16 DIAGNOSIS — R51 Headache: Secondary | ICD-10-CM

## 2018-10-16 DIAGNOSIS — E039 Hypothyroidism, unspecified: Secondary | ICD-10-CM | POA: Diagnosis not present

## 2018-10-16 DIAGNOSIS — R519 Headache, unspecified: Secondary | ICD-10-CM

## 2018-10-16 LAB — TSH: TSH: 7.63 u[IU]/mL — AB (ref 0.35–4.50)

## 2018-10-16 MED ORDER — LEVOTHYROXINE SODIUM 88 MCG PO TABS: 88.0000 ug | ORAL_TABLET | Freq: Every day | ORAL | 1 refills | Status: DC

## 2018-10-16 NOTE — Patient Instructions (Signed)
GO TO THE LAB : Get the blood work     GO TO THE FRONT DESK Schedule your next appointment   Watch your headaches If you have any unusual, severe persisting headache, call or go to the ER Try to figure out what might be triggering your headaches for instance lack of his sleep, skipping a meal, stress.  Take your thyroid medication every day, will send a refill to [facility] with the results

## 2018-10-16 NOTE — Progress Notes (Signed)
Subjective:    Patient ID: Loretta [name], female    DOB: 1991-08-10, 28 y.o.   MRN: [id number]  DOS:  10/16/2018 Type of visit - description: Acute visit The patient has chronic headaches, they have been more frequent in the last 3 months. She has a headache that requires OTC approximately once a week.  She reports good response to Tylenol. Headache is located at the forehead mostly more so on the right side.  Onset is not abrupt, it gradually built up and within 2 to 3 hours of taking the medication the headache is gone. Admits to some light intolerance although she is not completely sure, no noise intolerance. Not sure about triggers. Concerned about her family history of brain aneurysms.  Also, lately she has been stuttering.  Denies any slurred speech per se.  Hypothyroidism: Off medication for months, the pharmacy stop sending her reminders and she forgot to take    Review of Systems Not on birth control pills, LMP 10/10/2018. Denies diplopia, slurred speech, motor deficit, facial numbness. No neck pain or stiffness  Past Medical History:  Diagnosis Date  . ACL tear 11/2014   left  . Asthma 03/27/2012  . Headache(784.0)    stress/migraine  . History of cardiac murmur    "innocent murmur" 2011 - states has resolved  . History of strabismus   . Hypothyroidism    has not taken medication since 09/2014  . Neurofibromatosis, type 1 (von Recklinghausen's disease) (HCC)   . Seasonal asthma    prn inhaler    Past Surgical History:  Procedure Laterality Date  . ANTERIOR CRUCIATE LIGAMENT REPAIR Left scheduled April 2016   Hamstring graft   . STRABISMUS SURGERY     age 83  . STRABISMUS SURGERY Right 03/01/2014   Procedure: REPAIR STRABISMUS BILATERAL;  Surgeon: Shara Blazing, MD;  Location: [facility];  Service: Ophthalmology;  Laterality: Right;  . TUMOR EXCISION Right as an infant   lower eyelid    Social History   Socioeconomic History  . Marital  status: Single    Spouse name: Not on file  . Number of children: 0  . Years of education: College  . Highest education level: Not on file  Occupational History  . Occupation: Architect, works part time   Engineer, production  . Financial resource strain: Not on file  . Food insecurity:    Worry: Not on file    Inability: Not on file  . Transportation needs:    Medical: Not on file    Non-medical: Not on file  Tobacco Use  . Smoking status: Never Smoker  . Smokeless tobacco: Never Used  Substance and Sexual Activity  . Alcohol use: Yes    Comment: social  . Drug use: No  . Sexual activity: Never    Comment: VIRGIN  Lifestyle  . Physical activity:    Days per week: Not on file    Minutes per session: Not on file  . Stress: Not on file  Relationships  . Social connections:    Talks on phone: Not on file    Gets together: Not on file    Attends religious service: Not on file    Active member of club or organization: Not on file    Attends meetings of clubs or organizations: Not on file    Relationship status: Not on file  . Intimate partner violence:    Fear of current or ex partner: Not on file  Emotionally abused: Not on file    Physically abused: Not on file    Forced sexual activity: Not on file  Other Topics Concern  . Not on file  Social History Narrative   G0P0   Patient lives at home with her parents.    Left handed.    Finish [employer] [id number]      Allergies as of 10/16/2018   No Known Allergies     Medication List       Accurate as of October 16, 2018 11:59 PM. Always use your most recent med list.        albuterol 108 (90 Base) MCG/ACT inhaler Commonly known as:  VENTOLIN HFA Inhale 2 puffs into the lungs every 6 (six) hours as needed for wheezing or shortness of breath.   FISH OIL PO Take by mouth.   levothyroxine 88 MCG tablet Commonly known as:  SYNTHROID, LEVOTHROID Take 1 tablet (88 mcg total) by mouth daily before breakfast.     VITAMIN C PO Take 1 tablet by mouth daily.           Objective:   Physical Exam BP 116/72 (BP Location: Left Arm, Patient Position: Sitting, Cuff Size: Small)   Pulse 76   Temp 98.2 F (36.8 C) (Oral)   Resp 16   Ht 5\' 1"  (1.549 m)   Wt 145 lb 6 oz (65.9 kg)   LMP 10/10/2018 (Exact Date)   SpO2 98%   BMI 27.47 kg/m  General:   Well developed, NAD, BMI noted. HEENT:  Normocephalic . Face symmetric, atraumatic Neck: Supple.  Mildly enlarged thyroid gland, more noticeable on the right, not tender. Lungs:  CTA B Normal respiratory effort, no intercostal retractions, no accessory muscle use. Heart: RRR,  no murmur.  No pretibial edema bilaterally  Skin: Not pale. Not jaundice Neurologic:  alert & oriented X3.  Speech normal, gait appropriate for age and unassisted. EOMI, pupils equal and reactive, face symmetric, motor and DTR symmetric. Psych--  Cognition and judgment appear intact.  Cooperative with normal attention span and concentration.  Behavior appropriate. No anxious or depressed appearing.      Assessment     Assessment Hypothyroidism  GERD H/o asthma , prn albuterol, seasonal  Neurofibromatoses  FH: First cousin dies form a brain aneurysm  Plan: Worsening chronic headaches: Long h/o HAs on and off, more noticeable lately, no red flag symptoms by history, not the worst of her life, + respons to Tylenol.  Neurological exam normal.  She is concerned about her family history of brain aneurysm, her symptoms are not consistent with thunderclap headache and I explained that to the patient.   On the other hand, I told her the way to rule out a brain aneurysm on her is to do MRI/MRA.  Cost on coverage might be an issue.   She understood elected to monitor her symptoms, if they get worse, she is to let me know. We also talked about possibly nortriptyline for prevention of what seems to be tension headaches, for now elected to continue OTCs and  watch for triggers.   See AVS. Hypothyroidism: Not on medications for months, see HPI,  I advised patient of the important to take medication.  We will check a TSH and refill w/ results. RTC 3 months

## 2018-10-16 NOTE — Progress Notes (Signed)
Pre visit review using our clinic review tool, if applicable. No additional management support is needed unless otherwise documented below in the visit note. 

## 2018-10-17 NOTE — Assessment & Plan Note (Signed)
Worsening chronic headaches: Long h/o HAs on and off, more noticeable lately, no red flag symptoms by history, not the worst of her life, + respons to Tylenol.  Neurological exam normal.  She is concerned about her family history of brain aneurysm, her symptoms are not consistent with thunderclap headache and I explained that to the patient.   On the other hand, I told her the way to rule out a brain aneurysm on her is to do MRI/MRA.  Cost on coverage might be an issue.   She understood elected to monitor her symptoms, if they get worse, she is to let me know. We also talked about possibly nortriptyline for prevention of what seems to be tension headaches, for now elected to continue OTCs and  watch for triggers.  See AVS. Hypothyroidism: Not on medications for months, see HPI,  I advised patient of the important to take medication.  We will check a TSH and refill w/ results. RTC 3 months

## 2018-10-18 ENCOUNTER — Encounter: Payer: 59 | Admitting: Obstetrics & Gynecology

## 2018-12-11 ENCOUNTER — Ambulatory Visit (INDEPENDENT_AMBULATORY_CARE_PROVIDER_SITE_OTHER): Payer: BLUE CROSS/BLUE SHIELD | Admitting: Obstetrics & Gynecology

## 2018-12-11 ENCOUNTER — Encounter: Payer: Self-pay | Admitting: Obstetrics & Gynecology

## 2018-12-11 ENCOUNTER — Other Ambulatory Visit: Payer: Self-pay

## 2018-12-11 VITALS — BP 126/74 | Ht 61.0 in | Wt 148.0 lb

## 2018-12-11 DIAGNOSIS — E663 Overweight: Secondary | ICD-10-CM

## 2018-12-11 DIAGNOSIS — Z01419 Encounter for gynecological examination (general) (routine) without abnormal findings: Secondary | ICD-10-CM | POA: Diagnosis not present

## 2018-12-11 DIAGNOSIS — Z3009 Encounter for other general counseling and advice on contraception: Secondary | ICD-10-CM

## 2018-12-11 NOTE — Progress Notes (Signed)
Loretta [name] [birth year] [id number]   History:    28 y.o. G0  Single/Virgin.  Lives with parents.  Works in Surveyor, mining therapy.  RP:  Established patient presenting for annual gyn exam   HPI: Normal menstrual periods every month with normal flow.  No breakthrough bleeding.  No pelvic pain.  Normal vaginal secretions.  Virgin.  Urine and bowel movements normal.  Breast normal.  Body mass index 27.96.  Not very physically active.  Health labs with family physician Dr. Drue Novel.  Past medical history,surgical history, family history and social history were all reviewed and documented in the EPIC chart.  Gynecologic History Patient's last menstrual period was 11/13/2018. Contraception: abstinence Last Pap: 09/2017. Results were: Negative Last mammogram: Never Bone Density: Never Colonoscopy: Never  Obstetric History OB History  Gravida Para Term Preterm AB Living  0            SAB TAB Ectopic Multiple Live Births                ROS: A ROS was performed and pertinent positives and negatives are included in the history.  GENERAL: No fevers or chills. HEENT: No change in vision, no earache, sore throat or sinus congestion. NECK: No pain or stiffness. CARDIOVASCULAR: No chest pain or pressure. No palpitations. PULMONARY: No shortness of breath, cough or wheeze. GASTROINTESTINAL: No abdominal pain, nausea, vomiting or diarrhea, melena or bright red blood per rectum. GENITOURINARY: No urinary frequency, urgency, hesitancy or dysuria. MUSCULOSKELETAL: No joint or muscle pain, no back pain, no recent trauma. DERMATOLOGIC: No rash, no itching, no lesions. ENDOCRINE: No polyuria, polydipsia, no heat or cold intolerance. No recent change in weight. HEMATOLOGICAL: No anemia or easy bruising or bleeding. NEUROLOGIC: No headache, seizures, numbness, tingling or weakness. PSYCHIATRIC: No depression, no loss of interest in normal activity or change in sleep pattern.     Exam:   BP 126/74   Ht 5\' 1"   (1.549 m)   Wt 148 lb (67.1 kg)   LMP 11/13/2018   BMI 27.96 kg/m   Body mass index is 27.96 kg/m.  General appearance : Well developed well nourished female. No acute distress HEENT: Eyes: no retinal hemorrhage or exudates,  Neck supple, trachea midline, no carotid bruits, no thyroidmegaly Lungs: Clear to auscultation, no rhonchi or wheezes, or rib retractions  Heart: Regular rate and rhythm, no murmurs or gallops Breast:Examined in sitting and supine position were symmetrical in appearance, no palpable masses or tenderness,  no skin retraction, no nipple inversion, no nipple discharge, no skin discoloration, no axillary or supraclavicular lymphadenopathy Abdomen: no palpable masses or tenderness, no rebound or guarding Extremities: no edema or skin discoloration or tenderness  Pelvic: Vulva: Normal             Vagina: No gross lesions or discharge  Cervix: No gross lesions or discharge  Uterus  AV, normal size, shape and consistency, non-tender and mobile  Adnexa  Without masses or tenderness  Anus: Normal   Assessment/Plan:  28 y.o. female for annual exam   1. Well female exam with routine gynecological exam Normal gynecologic exam.  Pap test negative in January 2019, no indication to repeat this year, patient is still virgin.  Breast normal.  Health labs with family physician.  2. Encounter for other general counseling or advice on contraception Virgin.    3. Overweight (BMI 25.0-29.9) Recommend a lower calorie/carb diet such as [employer].  Aerobic physical activities 5 times a week and weightlifting  every 2 days.  [name] MD, 9:27 AM 12/11/2018

## 2018-12-11 NOTE — Patient Instructions (Signed)
1. Well female exam with routine gynecological exam Normal gynecologic exam.  Pap test negative in January 2019, no indication to repeat this year, patient is still virgin.  Breast normal.  Health labs with family physician.  2. Encounter for other general counseling or advice on contraception Virgin.    3. Overweight (BMI 25.0-29.9) Recommend a lower calorie/carb diet such as [employer].  Aerobic physical activities 5 times a week and weightlifting every 2 days.  Loretta [name], it was a pleasure seeing you today!

## 2018-12-19 ENCOUNTER — Encounter: Payer: Self-pay | Admitting: Internal Medicine

## 2018-12-19 ENCOUNTER — Other Ambulatory Visit: Payer: Self-pay | Admitting: Internal Medicine

## 2018-12-19 MED ORDER — ALBUTEROL SULFATE HFA 108 (90 BASE) MCG/ACT IN AERS: 2.0000 | INHALATION_SPRAY | Freq: Four times a day (QID) | RESPIRATORY_TRACT | 1 refills | Status: DC | PRN

## 2019-01-19 ENCOUNTER — Ambulatory Visit: Payer: BLUE CROSS/BLUE SHIELD | Admitting: Internal Medicine

## 2019-02-26 ENCOUNTER — Telehealth: Payer: Self-pay | Admitting: Internal Medicine

## 2019-02-26 NOTE — Telephone Encounter (Signed)
LMOM, she is due for a TSH.  Orders are already in. Sherri, please call the patient and arrange for blood work within the next few days

## 2019-03-06 ENCOUNTER — Encounter: Payer: Self-pay | Admitting: Internal Medicine

## 2019-03-09 ENCOUNTER — Other Ambulatory Visit: Payer: Self-pay

## 2019-03-09 ENCOUNTER — Encounter: Payer: Self-pay | Admitting: Internal Medicine

## 2019-03-09 ENCOUNTER — Ambulatory Visit (INDEPENDENT_AMBULATORY_CARE_PROVIDER_SITE_OTHER): Payer: BC Managed Care – PPO | Admitting: Internal Medicine

## 2019-03-09 ENCOUNTER — Other Ambulatory Visit: Payer: BLUE CROSS/BLUE SHIELD

## 2019-03-09 VITALS — BP 119/70 | HR 80 | Temp 98.5°F | Resp 16 | Ht 61.0 in | Wt 146.1 lb

## 2019-03-09 DIAGNOSIS — M545 Low back pain, unspecified: Secondary | ICD-10-CM

## 2019-03-09 DIAGNOSIS — E039 Hypothyroidism, unspecified: Secondary | ICD-10-CM

## 2019-03-09 LAB — TSH: TSH: 5.36 u[IU]/mL — ABNORMAL HIGH (ref 0.35–4.50)

## 2019-03-09 NOTE — Progress Notes (Signed)
Subjective:    Patient ID: Loretta [name], female    DOB: [birth year], 28 y.o.   MRN: [id number]  DOS:  03/09/2019 Type of visit - description: Acute After a period of inactivity, she is started to exercise with her trainer online. Last week, after exercising she developed low bilateral back pain without radiation.  She is icing the area and stretching and feels somewhat better.  She also has pain a little higher on the left side.  Hypothyroidism: 40% compliance with medication Also a long history of right foot pain, mostly on the plantar area throughout and some the external malleolus.  Review of Systems Denies fever chills No rash No falls No cough Headaches, see last visit, resolved  Past Medical History:  Diagnosis Date  . ACL tear 11/2014   left  . Asthma 03/27/2012  . Headache(784.0)    stress/migraine  . History of cardiac murmur    "innocent murmur" 2011 - states has resolved  . History of strabismus   . Hypothyroidism    has not taken medication since 09/2014  . Neurofibromatosis, type 1 (von Recklinghausen's disease) (HCC)   . Seasonal asthma    prn inhaler    Past Surgical History:  Procedure Laterality Date  . ANTERIOR CRUCIATE LIGAMENT REPAIR Left scheduled April 2016   Hamstring graft   . STRABISMUS SURGERY     age 28  . STRABISMUS SURGERY Right 03/01/2014   Procedure: REPAIR STRABISMUS BILATERAL;  Surgeon: Shara BlazingWilliam O Young, MD;  Location: [facility];  Service: Ophthalmology;  Laterality: Right;  . TUMOR EXCISION Right as an infant   lower eyelid    Social History   Socioeconomic History  . Marital status: Single    Spouse name: Not on file  . Number of children: 0  . Years of education: College  . Highest education level: Not on file  Occupational History  . Occupation: Architectrecreational therapist, works part time   Engineer, productionocial Needs  . Financial resource strain: Not on file  . Food insecurity    Worry: Not on file    Inability: Not on file   . Transportation needs    Medical: Not on file    Non-medical: Not on file  Tobacco Use  . Smoking status: Never Smoker  . Smokeless tobacco: Never Used  Substance and Sexual Activity  . Alcohol use: Yes    Comment: social  . Drug use: No  . Sexual activity: Never    Comment: VIRGIN  Lifestyle  . Physical activity    Days per week: Not on file    Minutes per session: Not on file  . Stress: Not on file  Relationships  . Social Musicianconnections    Talks on phone: Not on file    Gets together: Not on file    Attends religious service: Not on file    Active member of club or organization: Not on file    Attends meetings of clubs or organizations: Not on file    Relationship status: Not on file  . Intimate partner violence    Fear of current or ex partner: Not on file    Emotionally abused: Not on file    Physically abused: Not on file    Forced sexual activity: Not on file  Other Topics Concern  . Not on file  Social History Narrative   G0P0   Patient lives at home with her parents.    Left handed.    [employer] 01-2014  Allergies as of 03/09/2019   No Known Allergies     Medication List       Accurate as of March 09, 2019  9:37 AM. If you have any questions, ask your nurse or doctor.        albuterol 108 (90 Base) MCG/ACT inhaler Commonly known as: Ventolin HFA Inhale 2 puffs into the lungs every 6 (six) hours as needed for wheezing or shortness of breath.   FISH OIL PO Take by mouth.   levothyroxine 88 MCG tablet Commonly known as: SYNTHROID Take 1 tablet (88 mcg total) by mouth daily before breakfast.   VITAMIN C PO Take 1 tablet by mouth daily.           Objective:   Physical Exam BP 119/70 (BP Location: Right Arm, Patient Position: Sitting, Cuff Size: Small)   Pulse 80   Temp 98.5 F (36.9 C) (Oral)   Resp 16   Ht 5\' 1"  (1.549 m)   Wt 146 lb 2 oz (66.3 kg)   LMP 02/16/2019   SpO2 99%   BMI 27.61 kg/m  General:   Well developed, NAD,  BMI noted. HEENT:  Normocephalic . Face symmetric, atraumatic MSK: Thoracolumbar spine no TTP, slightly TTP at the lower back bilaterally particularly at the left SI. Feet: Symmetric, no TTP, no swelling, I noticed symmetric very high arch.  Range of motion is normal bilaterally however she developed some pain at the right ankle with passive range of motion. Skin: Not pale. Not jaundice Neurologic:  alert & oriented X3.  Speech normal, gait appropriate for age and unassisted.  DTRs symmetric, straight leg test negative. Psych--  Cognition and judgment appear intact.  Cooperative with normal attention span and concentration.  Behavior appropriate. No anxious or depressed appearing.      Assessment    Assessment Hypothyroidism  GERD H/o asthma , prn albuterol, seasonal  Neurofibromatoses  FH: First cousin dies form a brain aneurysm  Plan: Hypothyroidism: Only 40% compliance, it is hard for her to remember to take her medications before breakfast.  I recommended to take the medication at a time that is more convenient even if not ideal (not fasting).  Labs in 4 to 6 weeks. Back pain: Likely sprain, no red flag symptoms, she is already getting better with icing and stretching, recommend to continue that along with judicious use of OTC Tylenol or ibuprofen.  By all means, continue working with a Clinical research associate.  Call if not better Right foot pain: She has a very high arch, exam is otherwise benign.  Recommend OTC shoe insert with arch support to be using gradually.  If not better might need to see sports medicine. Headaches: See last visit, resolved. Preventive care: She sees gynecology regularly Follow-up: Labs in 4-6 weeks;  6 to 8 months for a routine visit

## 2019-03-09 NOTE — Progress Notes (Signed)
Pre visit review using our clinic review tool, if applicable. No additional management support is needed unless otherwise documented below in the visit note. 

## 2019-03-09 NOTE — Patient Instructions (Signed)
  GO TO THE FRONT DESK Schedule your next appointment   For labs only in 4-5 weeks and for a follow up in 6-8 months

## 2019-03-11 NOTE — Assessment & Plan Note (Signed)
Hypothyroidism: Only 40% compliance, it is hard for her to remember to take her medications before breakfast.  I recommended to take the medication at a time that is more convenient even if not ideal (not fasting).  Labs in 4 to 6 weeks. Back pain: Likely sprain, no red flag symptoms, she is already getting better with icing and stretching, recommend to continue that along with judicious use of OTC Tylenol or ibuprofen.  By all means, continue working with a Clinical research associate.  Call if not better Right foot pain: She has a very high arch, exam is otherwise benign.  Recommend OTC shoe insert with arch support to be using gradually.  If not better might need to see sports medicine. Headaches: See last visit, resolved. Preventive care: She sees gynecology regularly Follow-up: Labs in 4-6 weeks;  6 to 8 months for a routine visit

## 2019-03-22 ENCOUNTER — Encounter: Payer: Self-pay | Admitting: Internal Medicine

## 2019-03-22 DIAGNOSIS — M79671 Pain in right foot: Secondary | ICD-10-CM

## 2019-04-06 ENCOUNTER — Other Ambulatory Visit: Payer: BC Managed Care – PPO

## 2019-04-10 ENCOUNTER — Ambulatory Visit: Payer: BC Managed Care – PPO | Admitting: Family Medicine

## 2019-04-16 ENCOUNTER — Other Ambulatory Visit: Payer: Self-pay

## 2019-04-16 ENCOUNTER — Encounter: Payer: Self-pay | Admitting: Family Medicine

## 2019-04-16 ENCOUNTER — Ambulatory Visit (INDEPENDENT_AMBULATORY_CARE_PROVIDER_SITE_OTHER): Payer: BC Managed Care – PPO | Admitting: Family Medicine

## 2019-04-16 ENCOUNTER — Ambulatory Visit: Payer: Self-pay

## 2019-04-16 ENCOUNTER — Other Ambulatory Visit (INDEPENDENT_AMBULATORY_CARE_PROVIDER_SITE_OTHER): Payer: BC Managed Care – PPO

## 2019-04-16 VITALS — BP 110/80 | HR 99 | Ht 61.0 in | Wt 148.0 lb

## 2019-04-16 DIAGNOSIS — G8929 Other chronic pain: Secondary | ICD-10-CM

## 2019-04-16 DIAGNOSIS — M25571 Pain in right ankle and joints of right foot: Secondary | ICD-10-CM

## 2019-04-16 DIAGNOSIS — M24471 Recurrent dislocation, right ankle: Secondary | ICD-10-CM

## 2019-04-16 DIAGNOSIS — E039 Hypothyroidism, unspecified: Secondary | ICD-10-CM

## 2019-04-16 LAB — TSH: TSH: 2.7 u[IU]/mL (ref 0.35–4.50)

## 2019-04-16 NOTE — Progress Notes (Signed)
Tawana ScaleZach Rayden Dock D.O. [facility] [address] [place], [place] [address] Phone: [phone] Subjective:   Loretta [name], Valerie Wolf, am serving as a scribe for Dr. Antoine PrimasZachary Nichols Corter.  I'm seeing this patient by the request  of:  Wanda PlumpPaz, Jose E, MD   CC: right foot pain   BJY:[id number]:Subjective  Larwance SachsLisette Cheeks is a 28 y.o. female coming in with complaint of right ankle pain. Pain over lateral malleolus. No true mechanism. Pain increases with weight bearing. Was told that she has high arches and did get some insoles which made her pain worse. Feels like her ankles are unstable. Patient ices and uses brace. Has tried working out virtually more recently.  Patient rates the severity of pain is 5 out of 10    Past Medical History:  Diagnosis Date  . ACL tear 11/2014   left  . Asthma 03/27/2012  . Headache(784.0)    stress/migraine  . History of cardiac murmur    "innocent murmur" 2011 - states has resolved  . History of strabismus   . Hypothyroidism    has not taken medication since 09/2014  . Neurofibromatosis, type 1 (von Recklinghausen's disease) (HCC)   . Seasonal asthma    prn inhaler   Past Surgical History:  Procedure Laterality Date  . ANTERIOR CRUCIATE LIGAMENT REPAIR Left scheduled April 2016   Hamstring graft   . STRABISMUS SURGERY     age 89  . STRABISMUS SURGERY Right 03/01/2014   Procedure: REPAIR STRABISMUS BILATERAL;  Surgeon: Shara BlazingWilliam O Young, MD;  Location: [facility];  Service: Ophthalmology;  Laterality: Right;  . TUMOR EXCISION Right as an infant   lower eyelid   Social History   Socioeconomic History  . Marital status: Single    Spouse name: Not on file  . Number of children: 0  . Years of education: College  . Highest education level: Not on file  Occupational History  . Occupation: Architectrecreational therapist, works part time   Engineer, productionocial Needs  . Financial resource strain: Not on file  . Food insecurity    Worry: Not on file    Inability: Not on file  .  Transportation needs    Medical: Not on file    Non-medical: Not on file  Tobacco Use  . Smoking status: Never Smoker  . Smokeless tobacco: Never Used  Substance and Sexual Activity  . Alcohol use: Yes    Comment: social  . Drug use: No  . Sexual activity: Never    Comment: [place]  Lifestyle  . Physical activity    Days per week: Not on file    Minutes per session: Not on file  . Stress: Not on file  Relationships  . Social Musicianconnections    Talks on phone: Not on file    Gets together: Not on file    Attends religious service: Not on file    Active member of club or organization: Not on file    Attends meetings of clubs or organizations: Not on file    Relationship status: Not on file  Other Topics Concern  . Not on file  Social History Narrative   G0P0   Patient lives at home with her parents.    Left handed.    Finish [employer] 01-2014   No Known Allergies Family History  Problem Relation Age of Onset  . Diabetes Maternal Grandmother   . Colon cancer Neg Hx   . Breast cancer Neg Hx     Current  Outpatient Medications (Endocrine & Metabolic):  .  levothyroxine (SYNTHROID, LEVOTHROID) 88 MCG tablet, Take 1 tablet (88 mcg total) by mouth daily before breakfast.   Current Outpatient Medications (Respiratory):  .  albuterol (VENTOLIN HFA) 108 (90 Base) MCG/ACT inhaler, Inhale 2 puffs into the lungs every 6 (six) hours as needed for wheezing or shortness of breath.    Current Outpatient Medications (Other):  Marland Kitchen  Ascorbic Acid (VITAMIN C PO), Take 1 tablet by mouth daily. .  Omega-3 Fatty Acids (FISH OIL PO), Take by mouth.    Past medical history, social, surgical and family history all reviewed in electronic medical record.  No pertanent information unless stated regarding to the chief complaint.   Review of Systems:  No headache, visual changes, nausea, vomiting, diarrhea, constipation, dizziness, abdominal pain, skin rash, fevers, chills, night sweats, weight loss,  swollen lymph nodes, body achesmuscle aches, chest pain, shortness of breath, mood changes.  Intermittent joint swelling  Objective  Blood pressure 110/80, pulse 99, height 5\' 1"  (1.549 m), weight 148 lb (67.1 kg), SpO2 99 %.    General: No apparent distress alert and oriented x3 mood and affect normal, dressed appropriately.  HEENT: Pupils equal, extraocular movements intact  Respiratory: Patient's speak in full sentences and does not appear short of breath  Cardiovascular: No lower extremity edema, non tender, no erythema  Skin: Warm dry intact with no signs of infection or rash on extremities or on axial skeleton.  Abdomen: Soft nontender  Neuro: Cranial nerves II through XII are intact, neurovascularly intact in all extremities with 2+ DTRs and 2+ pulses.  Lymph: No lymphadenopathy of posterior or anterior cervical chain or axillae bilaterally.  Gait normal with good balance and coordination.  MSK:  Non tender with full range of motion and good stability and symmetric strength and tone of shoulders, elbows, wrist, hip, knee bilaterally.  Bilateral ankle exam show the patient does have supination of the foot.  Patient does have some mild swelling over the right lateral malleolus area.  Tenderness to palpation around the peroneal tendons.  Patient does have audible popping with range of motion seems to be on the lateral aspect of the ankle.  Negative Tinel sign.  Near full range of motion of the ankle.  No pain over the tarsal area or mortise.  Limited musculoskeletal ultrasound was performed and interpreted by Lyndal Pulley  Limited ultrasound of patient's ankle shows the patient does have chronic subluxation of the peroneal tendons noted with more of a horizontal displacement.  Mild hypoechoic changes especially at the inferior aspect of the lateral malleolus.  No true tearing appreciated.  Does appear to be intact distally. Impression: Peroneal subluxation  97110; 15 additional minutes  spent for Therapeutic exercises as stated in above notes.  This included exercises focusing on stretching, strengthening, with significant focus on eccentric aspects.   Long term goals include an improvement in range of motion, strength, endurance as well as avoiding reinjury. Patient's frequency would include in 1-2 times a day, 3-5 times a week for a duration of 6-12 weeks.  Ankle strengthening that included:  Basic range of motion exercises to allow proper full motion at ankle Stretching of the lower leg and hamstrings  Theraband exercises for the lower leg - inversion, eversion, dorsiflexion and plantarflexion each to be completed with a theraband Balance exercises to increase proprioception Weight bearing exercises to increase strength and balance  Proper technique shown and discussed handout in great detail with ATC.  All questions  were discussed and answered.     Impression and Recommendations:     This case required medical decision making of moderate complexity. The above documentation has been reviewed and is accurate and complete Lyndal Pulley, DO       Note: This dictation was prepared with Dragon dictation along with smaller phrase technology. Any transcriptional errors that result from this process are unintentional.

## 2019-04-16 NOTE — Patient Instructions (Signed)
Spenco orthotics Total Support Exercises 3x a week Heel lift 1/8 inch Voltaren gel 2x daily Body [email or web address] for ankle brace See me again in 5 weeks

## 2019-04-16 NOTE — Assessment & Plan Note (Signed)
Discussed home exercises, heel lift, over-the-counter orthotics secondary to the chronic supination of the foot.  Topical anti-inflammatories, icing regimen.  Follow-up again in 4 to 8 weeks.  Worsening symptoms consider injection and physical therapy

## 2019-04-18 ENCOUNTER — Encounter: Payer: Self-pay | Admitting: Internal Medicine

## 2019-04-19 MED ORDER — LEVOTHYROXINE SODIUM 88 MCG PO TABS: 88.0000 ug | ORAL_TABLET | Freq: Every day | ORAL | 1 refills | Status: DC

## 2019-04-19 NOTE — Addendum Note (Signed)
Addended byDamita Dunnings D on: 04/19/2019 08:09 AM   Modules accepted: Orders

## 2019-05-11 ENCOUNTER — Encounter: Payer: Self-pay | Admitting: Family Medicine

## 2019-05-14 NOTE — Telephone Encounter (Signed)
Left message for patient to call back to reschedule.

## 2019-05-21 ENCOUNTER — Ambulatory Visit: Payer: BC Managed Care – PPO | Admitting: Family Medicine

## 2019-06-12 NOTE — Progress Notes (Signed)
[facility] [address] [place], [place] [address] Phone: [phone] Subjective:   I Ronelle Nigh am serving as a Neurosurgeon for Dr. Antoine Primas.  I'm seeing this patient by the request  of:    CC:   [id number]   04/16/2019 Discussed home exercises, heel lift, over-the-counter orthotics secondary to the chronic supination of the foot.  Topical anti-inflammatories, icing regimen.  Follow-up again in 4 to 8 weeks.  Worsening symptoms consider injection and physical therapy  06/13/2019 Loretta [name] is a 28 y.o. female coming in with complaint of right ankle pain. States she feels about the same. States she is having pain in the left ankle that is in the same area.  Patient states that she is able to do more activity with less pain.  Has been trying to increase activity but still has not forearm which is patient's.     Past Medical History:  Diagnosis Date  . ACL tear 11/2014   left  . Asthma 03/27/2012  . Headache(784.0)    stress/migraine  . History of cardiac murmur    "innocent murmur" 2011 - states has resolved  . History of strabismus   . Hypothyroidism    has not taken medication since 09/2014  . Neurofibromatosis, type 1 (von Recklinghausen's disease) (HCC)   . Seasonal asthma    prn inhaler   Past Surgical History:  Procedure Laterality Date  . ANTERIOR CRUCIATE LIGAMENT REPAIR Left scheduled April 2016   Hamstring graft   . STRABISMUS SURGERY     age 44  . STRABISMUS SURGERY Right 03/01/2014   Procedure: REPAIR STRABISMUS BILATERAL;  Surgeon: Shara Blazing, MD;  Location: [facility];  Service: Ophthalmology;  Laterality: Right;  . TUMOR EXCISION Right as an infant   lower eyelid   Social History   Socioeconomic History  . Marital status: Single    Spouse name: Not on file  . Number of children: 0  . Years of education: College  . Highest education level: Not on file  Occupational History  . Occupation:  Architect, works part time   Engineer, production  . Financial resource strain: Not on file  . Food insecurity    Worry: Not on file    Inability: Not on file  . Transportation needs    Medical: Not on file    Non-medical: Not on file  Tobacco Use  . Smoking status: Never Smoker  . Smokeless tobacco: Never Used  Substance and Sexual Activity  . Alcohol use: Yes    Comment: social  . Drug use: No  . Sexual activity: Never    Comment: [place]  Lifestyle  . Physical activity    Days per week: Not on file    Minutes per session: Not on file  . Stress: Not on file  Relationships  . Social Musician on phone: Not on file    Gets together: Not on file    Attends religious service: Not on file    Active member of club or organization: Not on file    Attends meetings of clubs or organizations: Not on file    Relationship status: Not on file  Other Topics Concern  . Not on file  Social History Narrative   G0P0   Patient lives at home with her parents.    Left handed.    Finish [employer] 01-2014   No Known Allergies Family History  Problem Relation Age of  Onset  . Diabetes Maternal Grandmother   . Colon cancer Neg Hx   . Breast cancer Neg Hx     Current Outpatient Medications (Endocrine & Metabolic):  .  levothyroxine (SYNTHROID) 88 MCG tablet, Take 1 tablet (88 mcg total) by mouth daily before breakfast.   Current Outpatient Medications (Respiratory):  .  albuterol (VENTOLIN HFA) 108 (90 Base) MCG/ACT inhaler, Inhale 2 puffs into the lungs every 6 (six) hours as needed for wheezing or shortness of breath.    Current Outpatient Medications (Other):  Marland Kitchen  Ascorbic Acid (VITAMIN C PO), Take 1 tablet by mouth daily. .  Omega-3 Fatty Acids (FISH OIL PO), Take by mouth.    Past medical history, social, surgical and family history all reviewed in electronic medical record.  No pertanent information unless stated regarding to the chief complaint.   Review of  Systems:  No headache, visual changes, nausea, vomiting, diarrhea, constipation, dizziness, abdominal pain, skin rash, fevers, chills, night sweats, weight loss, swollen lymph nodes, body aches, joint swelling, muscle aches, chest pain, shortness of breath, mood changes.   Objective  Blood pressure 120/70, pulse 93, height 5\' 1"  (1.549 m), weight 153 lb (69.4 kg), SpO2 98 %.    General: No apparent distress alert and oriented x3 mood and affect normal, dressed appropriately.  HEENT: Pupils equal, extraocular movements intact  Respiratory: Patient's speak in full sentences and does not appear short of breath  Cardiovascular: No lower extremity edema, non tender, no erythema  Skin: Warm dry intact with no signs of infection or rash on extremities or on axial skeleton.  Abdomen: Soft nontender  Neuro: Cranial nerves II through XII are intact, neurovascularly intact in all extremities with 2+ DTRs and 2+ pulses.  Lymph: No lymphadenopathy of posterior or anterior cervical chain or axillae bilaterally.  Gait normal with good balance and coordination.  MSK:  Non tender with full range of motion and good stability and symmetric strength and tone of shoulders, elbows, wrist, hip, knee bilaterally.   Ankle: Bilateral No visible erythema or swelling. Range of motion is full in all directions. Strength is 5/5 in all directions. Stable lateral and medial ligaments; squeeze test and kleiger test unremarkable; Talar dome nontender; No pain at base of 5th MT; No tenderness over cuboid; No tenderness over N spot or navicular prominence No tenderness on posterior aspects of lateral and medial malleolus No sign of peroneal tendon subluxations or tenderness to palpation Negative tarsal tunnel tinel's Able to walk 4 steps.  Peroneal tendons are significantly less tender than previous exam especially on the right side and decreased swelling.    Impression and Recommendations:      The above  documentation has been reviewed and is accurate and complete Loretta Pulley, DO       Note: This dictation was prepared with Dragon dictation along with smaller phrase technology. Any transcriptional errors that result from this process are unintentional.

## 2019-06-13 ENCOUNTER — Ambulatory Visit (INDEPENDENT_AMBULATORY_CARE_PROVIDER_SITE_OTHER): Payer: BC Managed Care – PPO | Admitting: Family Medicine

## 2019-06-13 ENCOUNTER — Other Ambulatory Visit: Payer: Self-pay

## 2019-06-13 ENCOUNTER — Encounter: Payer: Self-pay | Admitting: Family Medicine

## 2019-06-13 DIAGNOSIS — M24471 Recurrent dislocation, right ankle: Secondary | ICD-10-CM

## 2019-06-13 NOTE — Patient Instructions (Signed)
Good to see you  Ice is your friend  Get a brace other foot  Start a walk-run progression: Only 3 times a week and then up to 30 minutes at a time   Once you have reached 30 mins: - Run 2 mins, then walk 1 min week 1  -Then run 3 mins, and walk 1 min week 2 -Then run 4 mins, and walk 1 min week3 -Then run 5 mins, and walk 1 min week 4  -Slowly build up weekly to running 30 mins nonstop.  If painful at any of the steps, back up one step.  See me again in 8 weeks to make sure perfect!

## 2019-06-14 ENCOUNTER — Encounter: Payer: Self-pay | Admitting: Family Medicine

## 2019-06-14 NOTE — Assessment & Plan Note (Signed)
Patient actually seems to be improving significantly.  I do not see as much swelling as patient is very minimal tender on exam at all today.  Discussed with patient at this point I would like to start a running progression and see how patient responds.  We discussed a compression sleeve still that I think will be beneficial with working out but not on a daily basis.  Patient can increase activity slowly.  Follow-up with me again in 4 to 6 weeks

## 2019-07-16 ENCOUNTER — Other Ambulatory Visit: Payer: Self-pay

## 2019-07-16 ENCOUNTER — Ambulatory Visit (INDEPENDENT_AMBULATORY_CARE_PROVIDER_SITE_OTHER): Payer: BC Managed Care – PPO | Admitting: Internal Medicine

## 2019-07-16 DIAGNOSIS — J4521 Mild intermittent asthma with (acute) exacerbation: Secondary | ICD-10-CM

## 2019-07-16 MED ORDER — BUDESONIDE-FORMOTEROL FUMARATE 80-4.5 MCG/ACT IN AERO: 2.0000 | INHALATION_SPRAY | Freq: Two times a day (BID) | RESPIRATORY_TRACT | 1 refills | Status: DC

## 2019-07-16 NOTE — Progress Notes (Signed)
Subjective:    Patient ID: Loretta [name], female    DOB: 21-Jul-1991, 28 y.o.   MRN: [id number]  DOS:  07/16/2019 Type of visit - description: Virtual Visit via Video Note  I connected with@   by a video enabled telemedicine application and verified that I am speaking with the correct person using two identifiers.   THIS ENCOUNTER IS A VIRTUAL VISIT DUE TO COVID-19 - PATIENT WAS NOT SEEN IN THE OFFICE. PATIENT HAS CONSENTED TO VIRTUAL VISIT / TELEMEDICINE VISIT   Location of patient: home  Location of provider: office  I discussed the limitations of evaluation and management by telemedicine and the availability of in person appointments. The patient expressed understanding and agreed to proceed.  History of Present Illness: Acute History of asthma, was basically asymptomatic until early September when she developed a dry cough. Started to use her albuterol more frequently and currently is using 2 puffs daily at least.  Also lately has developed some shortness of breath.    Review of Systems Denies any fever chills No heartburn No URI type of symptoms.  Has not here any actual wheezing. No lower extremity edema No chest pain, mild palpitations?. Not on birth control,  LMP October 9.  Past Medical History:  Diagnosis Date  . ACL tear 11/2014   left  . Asthma 03/27/2012  . Headache(784.0)    stress/migraine  . History of cardiac murmur    "innocent murmur" 2011 - states has resolved  . History of strabismus   . Hypothyroidism    has not taken medication since 09/2014  . Neurofibromatosis, type 1 (von Recklinghausen's disease) (HCC)   . Seasonal asthma    prn inhaler    Past Surgical History:  Procedure Laterality Date  . ANTERIOR CRUCIATE LIGAMENT REPAIR Left scheduled April 2016   Hamstring graft   . STRABISMUS SURGERY     age 6  . STRABISMUS SURGERY Right 03/01/2014   Procedure: REPAIR STRABISMUS BILATERAL;  Surgeon: Shara Blazing, MD;  [facility];  Service: Ophthalmology;  Laterality: Right;  . TUMOR EXCISION Right as an infant   lower eyelid    Social History   Socioeconomic History  . Marital status: Single    Spouse name: Not on file  . Number of children: 0  . Years of education: College  . Highest education level: Not on file  Occupational History  . Occupation: Architect, works part time   Engineer, production  . Financial resource strain: Not on file  . Food insecurity    Worry: Not on file    Inability: Not on file  . Transportation needs    Medical: Not on file    Non-medical: Not on file  Tobacco Use  . Smoking status: Never Smoker  . Smokeless tobacco: Never Used  Substance and Sexual Activity  . Alcohol use: Yes    Comment: social  . Drug use: No  . Sexual activity: Never    Comment: [place]  Lifestyle  . Physical activity    Days per week: Not on file    Minutes per session: Not on file  . Stress: Not on file  Relationships  . Social Musician on phone: Not on file    Gets together: Not on file    Attends religious service: Not on file    Active member of club or organization: Not on file    Attends meetings of clubs or organizations: Not on  file    Relationship status: Not on file  . Intimate partner violence    Fear of current or ex partner: Not on file    Emotionally abused: Not on file    Physically abused: Not on file    Forced sexual activity: Not on file  Other Topics Concern  . Not on file  Social History Narrative   G0P0   Patient lives at home with her parents.    Left handed.    Finish [employer] 04-997      Allergies as of 07/16/2019   No Known Allergies     Medication List       Accurate as of July 16, 2019 11:26 AM. If you have any questions, ask your nurse or doctor.        albuterol 108 (90 Base) MCG/ACT inhaler Commonly known as: Ventolin HFA Inhale 2 puffs into the lungs every 6 (six) hours as needed for wheezing or shortness of  breath.   FISH OIL PO Take by mouth.   levothyroxine 88 MCG tablet Commonly known as: SYNTHROID Take 1 tablet (88 mcg total) by mouth daily before breakfast.   VITAMIN C PO Take 1 tablet by mouth daily.           Objective:   Physical Exam There were no vitals taken for this visit. Is a virtual video visit, she looks alert oriented x3, in no distress.    Assessment     ssessment Hypothyroidism  GERD H/o asthma , prn albuterol, seasonal  Neurofibromatoses  FH: First cousin dies form a brain aneurysm  Plan: Asthma: The patient thinks her cough and shortness of breath are asthma related, I agree, does not have chest pain, fever, leg swelling. She knows the limitation of virtual visit. Plan: Add Symbicort twice a day every day, okay to continue using albuterol as needed. If not gradually improving in the next few days she will let me know. Follow-up here in 4 to 5 weeks, will call and schedule.   I discussed the assessment and treatment plan with the patient. The patient was provided an opportunity to ask questions and all were answered. The patient agreed with the plan and demonstrated an understanding of the instructions.   The patient was advised to call back or seek an in-person evaluation if the symptoms worsen or if the condition fails to improve as anticipated.

## 2019-07-17 NOTE — Assessment & Plan Note (Signed)
Asthma: The patient thinks her cough and shortness of breath are asthma related, I agree, does not have chest pain, fever, leg swelling. She knows the limitation of virtual visit. Plan: Add Symbicort twice a day every day, okay to continue using albuterol as needed. If not gradually improving in the next few days she will let me know. Follow-up here in 4 to 5 weeks, will call and schedule.

## 2019-08-17 ENCOUNTER — Ambulatory Visit: Payer: BC Managed Care – PPO | Admitting: Family Medicine

## 2019-08-22 ENCOUNTER — Other Ambulatory Visit: Payer: Self-pay

## 2019-08-22 DIAGNOSIS — Z20822 Contact with and (suspected) exposure to covid-19: Secondary | ICD-10-CM

## 2019-08-24 LAB — NOVEL CORONAVIRUS, NAA: SARS-CoV-2, NAA: NOT DETECTED

## 2019-09-07 ENCOUNTER — Ambulatory Visit: Payer: BC Managed Care – PPO | Admitting: Family Medicine

## 2019-09-14 ENCOUNTER — Ambulatory Visit: Payer: BC Managed Care – PPO | Admitting: Family Medicine

## 2019-09-14 ENCOUNTER — Ambulatory Visit: Payer: BC Managed Care – PPO | Admitting: Internal Medicine

## 2019-09-17 ENCOUNTER — Encounter: Payer: Self-pay | Admitting: Internal Medicine

## 2019-09-17 ENCOUNTER — Other Ambulatory Visit: Payer: Self-pay

## 2019-09-17 ENCOUNTER — Ambulatory Visit (INDEPENDENT_AMBULATORY_CARE_PROVIDER_SITE_OTHER): Payer: BC Managed Care – PPO | Admitting: Internal Medicine

## 2019-09-17 VITALS — BP 121/54 | HR 72 | Temp 97.6°F | Resp 16 | Ht 61.0 in | Wt 153.4 lb

## 2019-09-17 DIAGNOSIS — D649 Anemia, unspecified: Secondary | ICD-10-CM

## 2019-09-17 DIAGNOSIS — E039 Hypothyroidism, unspecified: Secondary | ICD-10-CM

## 2019-09-17 DIAGNOSIS — Z23 Encounter for immunization: Secondary | ICD-10-CM

## 2019-09-17 LAB — CBC WITH DIFFERENTIAL/PLATELET
Basophils Absolute: 0.1 10*3/uL (ref 0.0–0.1)
Basophils Relative: 1.2 % (ref 0.0–3.0)
Eosinophils Absolute: 0.1 10*3/uL (ref 0.0–0.7)
Eosinophils Relative: 1.1 % (ref 0.0–5.0)
HCT: 35.7 % — ABNORMAL LOW (ref 36.0–46.0)
Hemoglobin: 11.4 g/dL — ABNORMAL LOW (ref 12.0–15.0)
Lymphocytes Relative: 27.4 % (ref 12.0–46.0)
Lymphs Abs: 1.8 10*3/uL (ref 0.7–4.0)
MCHC: 31.9 g/dL (ref 30.0–36.0)
MCV: 79.3 fl (ref 78.0–100.0)
Monocytes Absolute: 0.5 10*3/uL (ref 0.1–1.0)
Monocytes Relative: 7.2 % (ref 3.0–12.0)
Neutro Abs: 4.1 10*3/uL (ref 1.4–7.7)
Neutrophils Relative %: 63.1 % (ref 43.0–77.0)
Platelets: 370 10*3/uL (ref 150.0–400.0)
RBC: 4.51 Mil/uL (ref 3.87–5.11)
RDW: 16.5 % — ABNORMAL HIGH (ref 11.5–15.5)
WBC: 6.5 10*3/uL (ref 4.0–10.5)

## 2019-09-17 LAB — TSH: TSH: 3.14 u[IU]/mL (ref 0.35–4.50)

## 2019-09-17 NOTE — Progress Notes (Signed)
Pre visit review using our clinic review tool, if applicable. No additional management support is needed unless otherwise documented below in the visit note. 

## 2019-09-17 NOTE — Progress Notes (Signed)
Subjective:    Patient ID: Loretta [name], female    DOB: 13-Jun-1991, 28 y.o.   MRN: [id number]  DOS:  09/17/2019 Type of visit - description: Routine follow-up Hypothyroidism: Good compliance w/  medicines Asthma: See last visit, she never started  Symbicort, symptoms are very good, not using her rescue inhaler History of anemia: Reports her periods are monthly, they last 4 days, 2 of them are heavy  Review of Systems  Denies fever chills No chest pain or difficulty breathing No nausea, vomiting, diarrhea.  No blood in the stools.  Past Medical History:  Diagnosis Date  . ACL tear 11/2014   left  . Asthma 03/27/2012  . Headache(784.0)    stress/migraine  . History of cardiac murmur    "innocent murmur" 2011 - states has resolved  . History of strabismus   . Hypothyroidism    has not taken medication since 09/2014  . Neurofibromatosis, type 1 (von Recklinghausen's disease) ([place])   . Seasonal asthma    prn inhaler    Past Surgical History:  Procedure Laterality Date  . ANTERIOR CRUCIATE LIGAMENT REPAIR Left scheduled April 2016   Hamstring graft   . STRABISMUS SURGERY     age 52  . STRABISMUS SURGERY Right 03/01/2014   Procedure: REPAIR STRABISMUS BILATERAL;  Surgeon: Derry Skill, MD;  Location: [facility];  Service: Ophthalmology;  Laterality: Right;  . TUMOR EXCISION Right as an infant   lower eyelid    Social History   Socioeconomic History  . Marital status: Single    Spouse name: Not on file  . Number of children: 0  . Years of education: College  . Highest education level: Not on file  Occupational History  . Occupation: Insurance claims handler, works part time   Tobacco Use  . Smoking status: Never Smoker  . Smokeless tobacco: Never Used  Substance and Sexual Activity  . Alcohol use: Yes    Comment: social  . Drug use: No  . Sexual activity: Never    Comment: [place]  Other Topics Concern  . Not on file  Social History Narrative    G0P0   Patient lives at home with her parents.    Left handed.    Rodrigo Ran 01-2014   Social Determinants of Health   Financial Resource Strain:   . Difficulty of Paying Living Expenses: Not on file  Food Insecurity:   . Worried About Charity fundraiser in the Last Year: Not on file  . Ran Out of Food in the Last Year: Not on file  Transportation Needs:   . Lack of Transportation (Medical): Not on file  . Lack of Transportation (Non-Medical): Not on file  Physical Activity:   . Days of Exercise per Week: Not on file  . Minutes of Exercise per Session: Not on file  Stress:   . Feeling of Stress : Not on file  Social Connections:   . Frequency of Communication with Friends and Family: Not on file  . Frequency of Social Gatherings with Friends and Family: Not on file  . Attends Religious Services: Not on file  . Active Member of Clubs or Organizations: Not on file  . Attends Archivist Meetings: Not on file  . Marital Status: Not on file  Intimate Partner Violence:   . Fear of Current or Ex-Partner: Not on file  . Emotionally Abused: Not on file  . Physically Abused: Not on file  . Sexually Abused: Not  on file      Allergies as of 09/17/2019   No Known Allergies     Medication List       Accurate as of September 17, 2019 11:59 PM. If you have any questions, ask your nurse or doctor.        STOP taking these medications   budesonide-formoterol 80-4.5 MCG/ACT inhaler Commonly known as: SYMBICORT Stopped by: Willow Ora, MD     TAKE these medications   albuterol 108 (90 Base) MCG/ACT inhaler Commonly known as: Ventolin HFA Inhale 2 puffs into the lungs every 6 (six) hours as needed for wheezing or shortness of breath.   FISH OIL PO Take by mouth.   levothyroxine 88 MCG tablet Commonly known as: SYNTHROID Take 1 tablet (88 mcg total) by mouth daily before breakfast.   VITAMIN C PO Take 1 tablet by mouth daily.           Objective:   Physical  Exam BP (!) 121/54 (BP Location: Left Arm, Patient Position: Sitting, Cuff Size: Small)   Pulse 72   Temp 97.6 F (36.4 C) (Temporal)   Resp 16   Ht 5\' 1"  (1.549 m)   Wt 153 lb 6 oz (69.6 kg)   LMP 07/29/2019 (Approximate)   SpO2 100%   BMI 28.98 kg/m  General:   Well developed, NAD, BMI noted. HEENT:  Normocephalic . Face symmetric, atraumatic Neck: No thyromegaly Lungs:  CTA B Normal respiratory effort, no intercostal retractions, no accessory muscle use. Heart: RRR,  no murmur.  No pretibial edema bilaterally  Skin: Not pale. Not jaundice Neurologic:  alert & oriented X3.  Speech normal, gait appropriate for age and unassisted Psych--  Cognition and judgment appear intact.  Cooperative with normal attention span and concentration.  Behavior appropriate. No anxious or depressed appearing.      Assessment    ASSESSMENT Hypothyroidism  GERD H/o asthma , prn albuterol, seasonal  Neurofibromatoses  FH: First cousin dies form a brain aneurysm Not on BP as off 08/2019  Plan: Hypothyroidism: On Synthroid, check a TSH Asthma: See last visit, never used Symbicort, she is back to normal, has not needed a rescue inhaler 4 months. Anemia: Noted on lab review, GI ROS negative, she has period q month.  Check CBC, iron, ferritin, TIBC. Preventive care: Had a flu shot, Tdap today, sees gynecology regularly, next visit 09-2019 RTC 6 months    This visit occurred during the SARS-CoV-2 public health emergency.  Safety protocols were in place, including screening questions prior to the visit, additional usage of staff PPE, and extensive cleaning of exam room while observing appropriate contact time as indicated for disinfecting solutions.

## 2019-09-17 NOTE — Patient Instructions (Signed)
GO TO THE LAB : Get the blood work     GO TO THE FRONT DESK Schedule your next appointment for a   checkup in 6 months 

## 2019-09-18 LAB — IRON,TIBC AND FERRITIN PANEL
%SAT: 11 % (calc) — ABNORMAL LOW (ref 16–45)
Ferritin: 3 ng/mL — ABNORMAL LOW (ref 16–154)
Iron: 45 ug/dL (ref 40–190)
TIBC: 400 mcg/dL (calc) (ref 250–450)

## 2019-09-18 NOTE — Assessment & Plan Note (Signed)
Plan: Hypothyroidism: On Synthroid, check a TSH Asthma: See last visit, never used Symbicort, she is back to normal, has not needed a rescue inhaler 4 months. Anemia: Noted on lab review, GI ROS negative, she has period q month.  Check CBC, iron, ferritin, TIBC. Preventive care: Had a flu shot, Tdap today, sees gynecology regularly, next visit 09-2019 RTC 6 months

## 2019-10-22 ENCOUNTER — Encounter: Payer: Self-pay | Admitting: Family Medicine

## 2019-10-22 ENCOUNTER — Other Ambulatory Visit: Payer: Self-pay

## 2019-10-22 ENCOUNTER — Ambulatory Visit (INDEPENDENT_AMBULATORY_CARE_PROVIDER_SITE_OTHER): Payer: BC Managed Care – PPO | Admitting: Family Medicine

## 2019-10-22 ENCOUNTER — Ambulatory Visit (INDEPENDENT_AMBULATORY_CARE_PROVIDER_SITE_OTHER): Payer: BC Managed Care – PPO

## 2019-10-22 VITALS — BP 128/68 | HR 101 | Ht 61.0 in | Wt 158.0 lb

## 2019-10-22 DIAGNOSIS — M24471 Recurrent dislocation, right ankle: Secondary | ICD-10-CM | POA: Diagnosis not present

## 2019-10-22 DIAGNOSIS — G8929 Other chronic pain: Secondary | ICD-10-CM | POA: Diagnosis not present

## 2019-10-22 DIAGNOSIS — M25571 Pain in right ankle and joints of right foot: Secondary | ICD-10-CM

## 2019-10-22 NOTE — Progress Notes (Signed)
[facility] [employer] [place] Phone: [phone] Subjective:   Loretta [name], am serving as a scribe for Dr. Hulan Saas. This visit occurred during the SARS-CoV-2 public health emergency.  Safety protocols were in place, including screening questions prior to the visit, additional usage of staff PPE, and extensive cleaning of exam room while observing appropriate contact time as indicated for disinfecting solutions.   I'm seeing this patient by the request  of:  Colon Branch, MD  CC: Right ankle pain follow-up  CWU:[id number]   06/13/2019 Patient actually seems to be improving significantly.  I do not see as much swelling as patient is very minimal tender on exam at all today.  Discussed with patient at this point I would like to start a running progression and see how patient responds.  We discussed a compression sleeve still that I think will be beneficial with working out but not on a daily basis.  Patient can increase activity slowly.  Follow-up with me again in 4 to 6 weeks  Update 10/22/2019 Loretta [name] is a 29 y.o. female coming in with complaint of right ankle pain. Patient states that she has not been working out at much due to the weather. Did hike a week ago which elicited some pain over lateral malleolus. Wearing body helix brace. Has not been diligent with HEP.      Past Medical History:  Diagnosis Date  . ACL tear 11/2014   left  . Asthma 03/27/2012  . Headache(784.0)    stress/migraine  . History of cardiac murmur    "innocent murmur" 2011 - states has resolved  . History of strabismus   . Hypothyroidism    has not taken medication since 09/2014  . Neurofibromatosis, type 1 (von Recklinghausen's disease) ([place])   . Seasonal asthma    prn inhaler   Past Surgical History:  Procedure Laterality Date  . ANTERIOR CRUCIATE LIGAMENT REPAIR Left scheduled April 2016   Hamstring graft   . STRABISMUS SURGERY     age 74   . STRABISMUS SURGERY Right 03/01/2014   Procedure: REPAIR STRABISMUS BILATERAL;  Surgeon: Derry Skill, MD;  Location: [facility];  Service: Ophthalmology;  Laterality: Right;  . TUMOR EXCISION Right as an infant   lower eyelid   Social History   Socioeconomic History  . Marital status: Single    Spouse name: Not on file  . Number of children: 0  . Years of education: College  . Highest education level: Not on file  Occupational History  . Occupation: Insurance claims handler, works part time   Tobacco Use  . Smoking status: Never Smoker  . Smokeless tobacco: Never Used  Substance and Sexual Activity  . Alcohol use: Yes    Comment: social  . Drug use: [name]  . Sexual activity: Never    Comment: [place]  Other Topics Concern  . Not on file  Social History Narrative   G0P0   Patient lives at home with her parents.    Left handed.    Rodrigo Ran 01-2014   Social Determinants of Health   Financial Resource Strain:   . Difficulty of Paying Living Expenses: Not on file  Food Insecurity:   . Worried About Charity fundraiser in the Last Year: Not on file  . Ran Out of Food in the Last Year: Not on file  Transportation Needs:   . Lack of Transportation (Medical): Not on file  .  Lack of Transportation (Non-Medical): Not on file  Physical Activity:   . Days of Exercise per Week: Not on file  . Minutes of Exercise per Session: Not on file  Stress:   . Feeling of Stress : Not on file  Social Connections:   . Frequency of Communication with Friends and Family: Not on file  . Frequency of Social Gatherings with Friends and Family: Not on file  . Attends Religious Services: Not on file  . Active Member of Clubs or Organizations: Not on file  . Attends Banker Meetings: Not on file  . Marital Status: Not on file   [name] Known Allergies Family History  Problem Relation Age of Onset  . Stomach cancer Father 71  . Diabetes Maternal Grandmother   . Colon  cancer Neg Hx   . Breast cancer Neg Hx     Current Outpatient Medications (Endocrine & Metabolic):  .  levothyroxine (SYNTHROID) 88 MCG tablet, Take 1 tablet (88 mcg total) by mouth daily before breakfast.   Current Outpatient Medications (Respiratory):  .  albuterol (VENTOLIN HFA) 108 (90 Base) MCG/ACT inhaler, Inhale 2 puffs into the lungs every 6 (six) hours as needed for wheezing or shortness of breath.    Current Outpatient Medications (Other):  Marland Kitchen  Ascorbic Acid (VITAMIN C PO), Take 1 tablet by mouth daily. .  Omega-3 Fatty Acids (FISH OIL PO), Take by mouth.    Past medical history, social, surgical and family history all reviewed in electronic medical record.  [name] pertanent information unless stated regarding to the chief complaint.   Review of Systems:  [name] headache, visual changes, nausea, vomiting, diarrhea, constipation, dizziness, abdominal pain, skin rash, fevers, chills, night sweats, weight loss, swollen lymph nodes, body aches, joint swelling, chest pain, shortness of breath, mood changes. POSITIVE muscle aches  Objective  Blood pressure 128/68, pulse (!) 101, height 5\' 1"  (1.549 m), weight 158 lb (71.7 kg), SpO2 98 %.   General: [name] apparent distress alert and oriented x3 mood and affect normal, dressed appropriately.  HEENT: Pupils equal, extraocular movements intact  Respiratory: Patient's speak in full sentences and does not appear short of breath  Cardiovascular: [name] lower extremity edema, non tender, [name] erythema  Skin: Warm dry intact with [name] signs of infection or rash on extremities or on axial skeleton.  Abdomen: Soft nontender  Neuro: Cranial nerves II through XII are intact, neurovascularly intact in all extremities with 2+ DTRs and 2+ pulses.  Lymph: [name] lymphadenopathy of posterior or anterior cervical chain or axillae bilaterally.  Gait normal with good balance and coordination.  MSK:  Non tender with full range of motion and good stability and symmetric  strength and tone of shoulders, elbows, wrist, hip, knee bilaterally.  Bilateral feet show that patient does have oversupination of the hindfoot.  [name] significant swelling over the peroneal tendons.  Full strength in ankle noted.  Negative Tinel's sign.  Neurovascular intact distally.  Breakdown of the transverse arch noted   Impression and Recommendations:     The above documentation has been reviewed and is accurate and complete , DO       Note: This dictation was prepared with Dragon dictation along with smaller phrase technology. Any transcriptional errors that result from this process are unintentional.

## 2019-10-22 NOTE — Assessment & Plan Note (Signed)
No significant inflammation at this time.  Discussed lifestyle regimen exercise, which activities to do which wants to avoid.  Discussed compression sleeve and as well as the exercises are heel lift.  Discussed the importance of comfortable shoes.  Ultrasound given today.  No significant hypoechoic changes.  Follow-up again as needed

## 2019-10-22 NOTE — Patient Instructions (Signed)
Wear body helix when working out [employer] after activity Ok to do what you want [employer] or [employer] [phone] See me when you need me

## 2019-11-04 ENCOUNTER — Encounter: Payer: Self-pay | Admitting: Family Medicine

## 2019-11-17 ENCOUNTER — Ambulatory Visit: Payer: BC Managed Care – PPO | Attending: Internal Medicine

## 2019-11-17 DIAGNOSIS — Z23 Encounter for immunization: Secondary | ICD-10-CM

## 2019-11-17 NOTE — Progress Notes (Signed)
   Covid-19 Vaccination Clinic  Name:  Loretta [name]    MRN: [id number] DOB: 10/22/1990  11/17/2019  Loretta [name] was observed post Covid-19 immunization for 15 minutes without incidence. She was provided with Vaccine Information Sheet and instruction to access the V-Safe system.   Loretta [name] was instructed to call 911 with any severe reactions post vaccine: Marland Kitchen Difficulty breathing  . Swelling of your face and throat  . A fast heartbeat  . A bad rash all over your body  . Dizziness and weakness    Immunizations Administered    Name Date Dose VIS Date Route   Pfizer COVID-19 Vaccine 11/17/2019 11:58 AM 0.3 mL 09/07/2019 Intramuscular   Manufacturer: [employer], [employer]   Lot: [id number]   NDC: [address]-8

## 2019-12-04 ENCOUNTER — Encounter: Payer: Self-pay | Admitting: Internal Medicine

## 2019-12-04 DIAGNOSIS — M25519 Pain in unspecified shoulder: Secondary | ICD-10-CM

## 2019-12-11 ENCOUNTER — Other Ambulatory Visit: Payer: Self-pay

## 2019-12-11 ENCOUNTER — Ambulatory Visit: Payer: Self-pay

## 2019-12-11 ENCOUNTER — Ambulatory Visit: Payer: BC Managed Care – PPO | Attending: Internal Medicine

## 2019-12-11 ENCOUNTER — Ambulatory Visit: Payer: BC Managed Care – PPO

## 2019-12-11 ENCOUNTER — Encounter: Payer: Self-pay | Admitting: Family Medicine

## 2019-12-11 ENCOUNTER — Ambulatory Visit (INDEPENDENT_AMBULATORY_CARE_PROVIDER_SITE_OTHER): Payer: BC Managed Care – PPO | Admitting: Family Medicine

## 2019-12-11 VITALS — BP 110/62 | HR 84 | Ht 61.0 in | Wt 160.0 lb

## 2019-12-11 DIAGNOSIS — Z23 Encounter for immunization: Secondary | ICD-10-CM

## 2019-12-11 DIAGNOSIS — M25511 Pain in right shoulder: Secondary | ICD-10-CM

## 2019-12-11 NOTE — Patient Instructions (Addendum)
Thank you for coming in today. Attend PT.  Do the exercises we discussed.  Ok to use voltaren gel.  Recheck in about 4-6 weeks Return sooner if needed.   Please perform the exercise program that we have prepared for you and gone over in detail on a daily basis.  In addition to the handout you were provided you can access your program through: [email or web address]   Your unique program code is: [email or web address]

## 2019-12-11 NOTE — Progress Notes (Signed)
   Covid-19 Vaccination Clinic  Name:  Loran Auguste    MRN: [id number] DOB: 05-07-1991  12/11/2019  Ms. Welke was observed post Covid-19 immunization for 15 minutes without incident. She was provided with Vaccine Information Sheet and instruction to access the V-Safe system.   Ms. Rincon was instructed to call 911 with any severe reactions post vaccine: Marland Kitchen Difficulty breathing  . Swelling of face and throat  . A fast heartbeat  . A bad rash all over body  . Dizziness and weakness   Immunizations Administered    Name Date Dose VIS Date Route   Pfizer COVID-19 Vaccine 12/11/2019 12:00 PM 0.3 mL 09/07/2019 Intramuscular   Manufacturer: [employer], [employer]   Lot: [id number]   NDC: [address]-6

## 2019-12-11 NOTE — Progress Notes (Signed)
I, Loretta [name], LAT, ATC, am serving as scribe for Dr. Lynne Leader.  Loretta [name] is a 29 y.o. female who presents to [facility] today for R shoulder pain.  She was last seen by Dr. Tamala Julian for her R ankle on 10/22/19.  Her current R shoulder pain began about 3 weeks ago and may be due to her workouts/exercise routine.  She states that she also tends to carry her work bag on her R shoulder.  She locates her R shoulder pain to the R anterior and posterior shoulder.  She rates her pain at a 3/10 and describes her pain as aching/pressure-type pain.  Radiating pain: Yes into her R upper arm Neck pain: No R UE numbness/tingling: No R UE weakness: No Aggravating factors: overhead R shoulder AROM, functional IR; horizontal aBd behind the body Treatments tried: Pain patches; heat; pain-relieving rub   Pertinent review of systems: No fevers or chills  Relevant historical information: Hypothyroidism and neurofibromatosis type I   Exam:  BP 110/62 (BP Location: Left Arm, Patient Position: Sitting, Cuff Size: Normal)   Pulse 84   Ht 5\' 1"  (1.549 m)   Wt 160 lb (72.6 kg)   SpO2 98%   BMI 30.23 kg/m  General: Well Developed, well nourished, and in no acute distress.   MSK: C-spine: Nontender midline normal cervical motion.  Mildly tender to palpation right trapezius. Right shoulder: Normal-appearing mildly tender trapezius otherwise nontender. Normal shoulder motion pain with abduction. Strength: Abduction 4+/5 with pain.  Normal external and internal rotation strength. Positive Hawkins and Neer's test positive empty can test. Negative Yergason's and speeds test.  Left shoulder normal-appearing nontender normal motion normal strength negative impingement testing.  Pulses cap refill and sensation are intact distally.    Lab and Radiology Results  Diagnostic Limited MSK Ultrasound of: Right shoulder Symptoms tendon normal-appearing intact in bicipital  groove. Subscapularis tendon normal-appearing without tear. Supraspinatus tendon no significant tear.  Increased subacromial bursal thickness. Infraspinatus tendon slight irregularity at insertion tip otherwise normal-appearing with no obvious tear. AC joint moderate effusion Impression: Subacromial bursitis possible partial infraspinatus tear.  No complete significant rotator cuff tear.     Assessment and Plan: 29 y.o. female with right shoulder pain occurring ongoing for a few weeks now.  Likely rotator cuff tendinitis and scapular dysfunction.  Patient may have a little partial rotator cuff tear of the infraspinatus tendon seen on ultrasound however did not have much problems with this tendon under exam testing.  Plan for home exercise program as taught in clinic today by ATC as well as referral to physical therapy.  Reasonable to use Voltaren gel as well.  Check back in 4 to 6 weeks.  Return sooner if needed.  Precautions reviewed. Doubtful today's problem has to do with her hypothyroidism or neurofibromatosis.   PDMP not reviewed this encounter. Orders Placed This Encounter  Procedures  . [place] LIMITED JOINT SPACE STRUCTURES UP RIGHT(NO LINKED CHARGES)    Order Specific Question:   Reason for Exam (SYMPTOM  OR DIAGNOSIS REQUIRED)    Answer:   R shoulder pain    Order Specific Question:   Preferred imaging location?    Answer:   [facility]  . Ambulatory referral to Physical Therapy    Referral Priority:   Routine    Referral Type:   Physical Medicine    Referral Reason:   Specialty Services Required    Requested Specialty:   Physical Therapy  No orders of the defined types were placed in this encounter.    Discussed warning signs or symptoms. Please see discharge instructions. Patient expresses understanding.   The above documentation has been reviewed and is accurate and complete Clementeen Graham

## 2019-12-14 ENCOUNTER — Other Ambulatory Visit: Payer: Self-pay

## 2019-12-17 ENCOUNTER — Other Ambulatory Visit: Payer: Self-pay

## 2019-12-17 ENCOUNTER — Encounter: Payer: Self-pay | Admitting: Obstetrics & Gynecology

## 2019-12-17 ENCOUNTER — Ambulatory Visit (INDEPENDENT_AMBULATORY_CARE_PROVIDER_SITE_OTHER): Payer: BC Managed Care – PPO | Admitting: Obstetrics & Gynecology

## 2019-12-17 VITALS — BP 126/78 | Ht 63.75 in | Wt 155.0 lb

## 2019-12-17 DIAGNOSIS — Z01419 Encounter for gynecological examination (general) (routine) without abnormal findings: Secondary | ICD-10-CM

## 2019-12-17 DIAGNOSIS — Z3009 Encounter for other general counseling and advice on contraception: Secondary | ICD-10-CM | POA: Diagnosis not present

## 2019-12-17 NOTE — Progress Notes (Signed)
    Loretta [name] Mar 25, 1991 [id number]   History:    29 y.o. G0  Single/Virgin.  Lives with parents.  Works in Surveyor, mining therapy.  RP:  Established patient presenting for annual gyn exam   HPI: Normal menstrual periods every month with normal flow.  No breakthrough bleeding.  No pelvic pain.  Normal vaginal secretions.  Virgin.  Urine and bowel movements normal.  Breast normal.  Body mass index 26.81.  Improving fitness.  Health labs with family physician Dr. Drue Novel.   Past medical history,surgical history, family history and social history were all reviewed and documented in the EPIC chart.  Gynecologic History Patient's last menstrual period was 12/10/2019.  Obstetric History OB History  Gravida Para Term Preterm AB Living  0            SAB TAB Ectopic Multiple Live Births                ROS: A ROS was performed and pertinent positives and negatives are included in the history.  GENERAL: No fevers or chills. HEENT: No change in vision, no earache, sore throat or sinus congestion. NECK: No pain or stiffness. CARDIOVASCULAR: No chest pain or pressure. No palpitations. PULMONARY: No shortness of breath, cough or wheeze. GASTROINTESTINAL: No abdominal pain, nausea, vomiting or diarrhea, melena or bright red blood per rectum. GENITOURINARY: No urinary frequency, urgency, hesitancy or dysuria. MUSCULOSKELETAL: No joint or muscle pain, no back pain, no recent trauma. DERMATOLOGIC: No rash, no itching, no lesions. ENDOCRINE: No polyuria, polydipsia, no heat or cold intolerance. No recent change in weight. HEMATOLOGICAL: No anemia or easy bruising or bleeding. NEUROLOGIC: No headache, seizures, numbness, tingling or weakness. PSYCHIATRIC: No depression, no loss of interest in normal activity or change in sleep pattern.     Exam:   BP 126/78   Ht 5' 3.75" (1.619 m)   Wt 155 lb (70.3 kg)   LMP 12/10/2019 Comment: no birth control   BMI 26.81 kg/m   Body mass index is 26.81  kg/m.  General appearance : Well developed well nourished female. No acute distress HEENT: Eyes: no retinal hemorrhage or exudates,  Neck supple, trachea midline, no carotid bruits, no thyroidmegaly Lungs: Clear to auscultation, no rhonchi or wheezes, or rib retractions  Heart: Regular rate and rhythm, no murmurs or gallops Breast:Examined in sitting and supine position were symmetrical in appearance, no palpable masses or tenderness,  no skin retraction, no nipple inversion, no nipple discharge, no skin discoloration, no axillary or supraclavicular lymphadenopathy Abdomen: no palpable masses or tenderness, no rebound or guarding Extremities: no edema or skin discoloration or tenderness  Pelvic: Vulva: Normal             Vagina: No gross lesions or discharge  Cervix: No gross lesions or discharge  Uterus  AV, normal size, shape and consistency, non-tender and mobile  Adnexa  Without masses or tenderness  Anus: Normal   Assessment/Plan:  29 y.o. female for annual exam   1. Well female exam with routine gynecological exam Normal gynecologic exam.  Pap test January 2019 was negative, no indication to repeat the Pap test this year as patient is still virgin.  Breast exam normal.  Body mass index improved at 26.81.  Continue to improve fitness.  Healthy nutrition.  Health labs with Dr. Drue Novel.  2. Encounter for other general counseling or advice on contraception Patient is a virgin.  [name] MD, 9:28 AM 12/17/2019

## 2019-12-17 NOTE — Patient Instructions (Signed)
1. Well female exam with routine gynecological exam Normal gynecologic exam.  Pap test January 2019 was negative, no indication to repeat the Pap test this year as patient is still virgin.  Breast exam normal.  Body mass index improved at 26.81.  Continue to improve fitness.  Healthy nutrition.  Health labs with Dr. Drue Novel.  2. Encounter for other general counseling or advice on contraception Patient is a virgin.  Loretta [name], it was a pleasure seeing you today

## 2020-01-22 ENCOUNTER — Ambulatory Visit: Payer: BC Managed Care – PPO | Admitting: Family Medicine

## 2020-02-01 ENCOUNTER — Telehealth: Payer: Self-pay | Admitting: Internal Medicine

## 2020-02-01 MED ORDER — ALBUTEROL SULFATE HFA 108 (90 BASE) MCG/ACT IN AERS: 2.0000 | INHALATION_SPRAY | Freq: Four times a day (QID) | RESPIRATORY_TRACT | 5 refills | Status: DC | PRN

## 2020-02-01 NOTE — Telephone Encounter (Signed)
Medication: albuterol (VENTOLIN HFA) 108 (90 Base) MCG/ACT inhaler [[id number]]    Has the patient contacted their pharmacy? No. (If no, request that the patient contact the pharmacy for the refill.) (If yes, when and what did the pharmacy advise?)  Preferred Pharmacy (with phone number or street name): [id number] [place], [place] - [address]  [address], [place] [address]  Phone:  [phone] Fax:  [phone]  DEA #:  [id number]  Agent: Please be advised that RX refills may take up to 3 business days. We ask that you follow-up with your pharmacy.

## 2020-02-01 NOTE — Telephone Encounter (Signed)
Rx sent 

## 2020-02-21 ENCOUNTER — Other Ambulatory Visit: Payer: Self-pay

## 2020-02-21 ENCOUNTER — Ambulatory Visit (INDEPENDENT_AMBULATORY_CARE_PROVIDER_SITE_OTHER): Payer: BC Managed Care – PPO | Admitting: Family Medicine

## 2020-02-21 ENCOUNTER — Ambulatory Visit: Payer: Self-pay

## 2020-02-21 ENCOUNTER — Encounter: Payer: Self-pay | Admitting: Family Medicine

## 2020-02-21 VITALS — BP 102/68 | HR 79 | Ht 63.75 in | Wt 155.8 lb

## 2020-02-21 DIAGNOSIS — M25511 Pain in right shoulder: Secondary | ICD-10-CM

## 2020-02-21 DIAGNOSIS — M899 Disorder of bone, unspecified: Secondary | ICD-10-CM | POA: Diagnosis not present

## 2020-02-21 NOTE — Progress Notes (Signed)
   I, Christoper Fabian, LAT, ATC, am serving as scribe for Dr. Clementeen Graham.  Loretta [name] is a 29 y.o. female who presents to [facility] at [facility] today for f/u of R shoulder pain.  She was last seen by Dr. Denyse Amass on 12/11/19 and was provided w/ a HEP focusing on RC and peri-scapular strengthening.  She was also referred to PT and advised to use Voltaren gel.  Since her last visit, pt reports that her R shoulder pain is slightly improved.  She has been intermittently been doing her HEP.  She reports pain today at her R superior shoulder and along her R inferior-lateral scapular border.  She also reports pain when running from the pumping motion of her arms.     Pertinent review of systems: No fevers or chills  Relevant historical information: Neurofibromatosis type I   Exam:  BP 102/68 (BP Location: Right Arm, Patient Position: Sitting, Cuff Size: Normal)   Pulse 79   Ht 5' 3.75" (1.619 m)   Wt 155 lb 12.8 oz (70.7 kg)   SpO2 99%   BMI 26.95 kg/m  General: Well Developed, well nourished, and in no acute distress.   MSK: Right shoulder normal-appearing Normal motion. Scapular motion is normal bilaterally. Nontender. Strength intact abduction external and internal rotation however pain with abduction. Positive Hawkins and Neer's test positive empty can test negative Yergason's and speeds test     Assessment and Plan: 29 y.o. female with right shoulder pain.  Subacromial impingement versus rotator cuff tendinopathy most likely.  Patient also has some inferior periscapular pain that is likely periscapular dysfunction.  Discussed options.  The original physical therapy location she was referred to one at work because of her work hours however I do believe she should be able to find some independent physical therapy locations near her that we will offer better hours.  She would like to do that along with increasing her home exercise program.  If not better next step would  likely be injection versus MRI.  She would like to try physical therapy first.    Orders Placed This Encounter  Procedures  . Ambulatory referral to Physical Therapy    Referral Priority:   Routine    Referral Type:   Physical Medicine    Referral Reason:   Specialty Services Required    Requested Specialty:   Physical Therapy    Number of Visits Requested:   1   No orders of the defined types were placed in this encounter.    Discussed warning signs or symptoms. Please see discharge instructions. Patient expresses understanding.   The above documentation has been reviewed and is accurate and complete Clementeen Graham, M.D.

## 2020-02-21 NOTE — Patient Instructions (Signed)
Lets look at some of the independent PT.  I think there are some that have better hours.  Maximize home exercises.  Keep me updated.  If not better either injection or MRI. Let me know.  Also let me know where you pick so I can send medical records.

## 2020-03-03 ENCOUNTER — Ambulatory Visit: Payer: BC Managed Care – PPO | Admitting: Family Medicine

## 2020-03-03 ENCOUNTER — Encounter: Payer: Self-pay | Admitting: Internal Medicine

## 2020-03-03 NOTE — Progress Notes (Deleted)
   I, Christoper Fabian, LAT, ATC, am serving as scribe for Dr. Clementeen Graham.  Loretta [name] is a 29 y.o. female who presents to [facility] at [facility] today for L ankle pain.  She was last seen by Dr. Denyse Amass on 02/21/20 for her R shoulder.  Since then, she injured her L ankle on .  She locates her pain to .  She has a hx of a prior L ankle sprain in June 2019.  L ankle swelling: L ankle mechanical symptoms: Aggravating factors: Treatments tried:  Diagnostic imaging: L ankle XR- 02/27/18  Pertinent review of systems: ***  Relevant historical information: ***   Exam:  There were no vitals taken for this visit. General: Well Developed, well nourished, and in no acute distress.   MSK: ***    Lab and Radiology Results No results found for this or any previous visit (from the past 72 hour(s)). No results found.     Assessment and Plan: 29 y.o. female with ***   PDMP not reviewed this encounter. No orders of the defined types were placed in this encounter.  No orders of the defined types were placed in this encounter.    Discussed warning signs or symptoms. Please see discharge instructions. Patient expresses understanding.   ***

## 2020-03-04 ENCOUNTER — Ambulatory Visit (INDEPENDENT_AMBULATORY_CARE_PROVIDER_SITE_OTHER): Payer: BC Managed Care – PPO

## 2020-03-04 ENCOUNTER — Encounter: Payer: Self-pay | Admitting: Family Medicine

## 2020-03-04 ENCOUNTER — Ambulatory Visit (INDEPENDENT_AMBULATORY_CARE_PROVIDER_SITE_OTHER): Payer: BC Managed Care – PPO | Admitting: Family Medicine

## 2020-03-04 ENCOUNTER — Ambulatory Visit: Payer: Self-pay

## 2020-03-04 ENCOUNTER — Other Ambulatory Visit: Payer: Self-pay

## 2020-03-04 VITALS — BP 110/70 | HR 77 | Ht 63.75 in | Wt 156.6 lb

## 2020-03-04 DIAGNOSIS — S93402A Sprain of unspecified ligament of left ankle, initial encounter: Secondary | ICD-10-CM

## 2020-03-04 NOTE — Progress Notes (Signed)
I, Loretta [name], LAT, ATC, am serving as scribe for Dr. Clementeen Graham.  Loretta [name] is a 29 y.o. female who presents to [facility] at [facility] today for B ankle pain, L>R.  She was last seen by Dr. Denyse [name] on 02/21/20 for her R shoulder.  Since her last visit, pt reports rolling her L ankle into inversion on Sunday and also rolled her R ankle the day before while walking.  She has a hx of a prior L ankle sprain in June 2019.  Radiating pain: yes into her L lower leg Ankle swelling: yes in her L ankle Aggravating factors: walking; prolonged standing; L ankle active inversion Treatments tried: B ankle braces; RICE; Tylenol  Diagnostic imaging: L ankle XR- 02/27/18   Pertinent review of systems: No fevers or chills  Relevant historical information: Neurofibromatosis type I. 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 Patient has her first appointment with physical therapy for her shoulder scheduled in the near future.   Exam:  BP 110/70 (BP Location: Right Arm, Patient Position: Sitting, Cuff Size: Normal)   Pulse 77   Ht 5' 3.75" (1.619 m)   Wt 156 lb 9.6 oz (71 kg)   SpO2 98%   BMI 27.09 kg/m  General: Well Developed, well nourished, and in no acute distress.   MSK: Left ankle swollen with bruising at posterior lateral aspect of left ankle. Tender palpation overlying left lateral malleolus. Ankle motion reduced with guarding. Ligament stability testing not testable due to patient guarding. Strength is intact. Pulses cap refill and sensation are intact distally.    Lab and Radiology Results Diagnostic Limited MSK Ultrasound of: Left ankle Lateral aspect of left ankle visualized. Peroneal tendons are intact however some hypoechoic fluid tracking along tendon sheath is present. No obvious cortical defect seen at lateral malleolus. Hypoechoic fluid tracking left lateral talofibular joint consistent with effusion. Impression:  Ankle sprain and possible peroneal tendinitis  X-ray images left ankle obtained today personally and independently reviewed No acute fractures Await formal radiology review   Assessment and Plan: 29 y.o. female with left ankle sprain.  Plan for cam walker boot and weightbearing as tolerated.  Recommend knee scooter for long distance travel.  Additionally will extend physical therapy to include her ankle.  Recheck back with me in a few weeks or sooner if needed.  Okay to wean into regular ankle brace when able.   PDMP not reviewed this encounter. Orders Placed This Encounter  Procedures  . [place] LIMITED JOINT SPACE STRUCTURES LOW LEFT(NO LINKED CHARGES)    Order Specific Question:   Reason for Exam (SYMPTOM  OR DIAGNOSIS REQUIRED)    Answer:   L ankle pain    Order Specific Question:   Preferred imaging location?    Answer:   Adult nurse Sports Medicine-[facility]  . DG Ankle Complete Left    Standing Status:   Future    Number of Occurrences:   1    Standing Expiration Date:   03/04/2021    Order Specific Question:   Reason for Exam (SYMPTOM  OR DIAGNOSIS REQUIRED)    Answer:   eval ankle injury    Order Specific Question:   Is patient pregnant?    Answer:   No    Order Specific Question:   Preferred imaging location?    Answer:   [facility]    Order Specific Question:   Radiology Contrast Protocol - do NOT remove file path  Answer:   [email or web address]  . Ambulatory referral to Physical Therapy    Referral Priority:   Routine    Referral Type:   Physical Medicine    Referral Reason:   Specialty Services Required    Requested Specialty:   Physical Therapy   No orders of the defined types were placed in this encounter.    Discussed warning signs or symptoms. Please see discharge instructions. Patient expresses understanding.   The above documentation has been reviewed and is accurate and complete Lynne Leader, M.D.

## 2020-03-04 NOTE — Patient Instructions (Addendum)
Thank you for coming in today. Plan for ankle cam walker boot.  Ok to wean into a regular ankle brace when able.  Attend PT also for the ankle.  Recheck with me in 3-4 weeks.  Return sooner if needed.  OK to delay or cancel if better.   Knee scooter or crutches as needed.  Can get them from [facility].

## 2020-03-05 NOTE — Progress Notes (Signed)
No fractures seen.

## 2020-03-17 ENCOUNTER — Ambulatory Visit: Payer: BC Managed Care – PPO | Admitting: Internal Medicine

## 2020-03-21 ENCOUNTER — Encounter: Payer: Self-pay | Admitting: Physical Therapy

## 2020-03-21 ENCOUNTER — Other Ambulatory Visit: Payer: Self-pay

## 2020-03-21 ENCOUNTER — Ambulatory Visit: Payer: BC Managed Care – PPO | Admitting: Internal Medicine

## 2020-03-21 ENCOUNTER — Ambulatory Visit (INDEPENDENT_AMBULATORY_CARE_PROVIDER_SITE_OTHER): Payer: BC Managed Care – PPO | Admitting: Internal Medicine

## 2020-03-21 ENCOUNTER — Ambulatory Visit: Payer: BC Managed Care – PPO | Attending: Family Medicine | Admitting: Physical Therapy

## 2020-03-21 VITALS — BP 121/67 | HR 81 | Temp 96.7°F | Resp 18 | Ht 61.0 in | Wt 156.0 lb

## 2020-03-21 DIAGNOSIS — D649 Anemia, unspecified: Secondary | ICD-10-CM | POA: Diagnosis not present

## 2020-03-21 DIAGNOSIS — M25511 Pain in right shoulder: Secondary | ICD-10-CM | POA: Insufficient documentation

## 2020-03-21 DIAGNOSIS — M25572 Pain in left ankle and joints of left foot: Secondary | ICD-10-CM | POA: Diagnosis present

## 2020-03-21 DIAGNOSIS — R2681 Unsteadiness on feet: Secondary | ICD-10-CM

## 2020-03-21 DIAGNOSIS — M6281 Muscle weakness (generalized): Secondary | ICD-10-CM | POA: Insufficient documentation

## 2020-03-21 DIAGNOSIS — G8929 Other chronic pain: Secondary | ICD-10-CM | POA: Diagnosis present

## 2020-03-21 DIAGNOSIS — E039 Hypothyroidism, unspecified: Secondary | ICD-10-CM | POA: Diagnosis not present

## 2020-03-21 LAB — CBC WITH DIFFERENTIAL/PLATELET
Basophils Absolute: 0.1 10*3/uL (ref 0.0–0.1)
Basophils Relative: 1.5 % (ref 0.0–3.0)
Eosinophils Absolute: 0.1 10*3/uL (ref 0.0–0.7)
Eosinophils Relative: 1.4 % (ref 0.0–5.0)
HCT: 35.6 % — ABNORMAL LOW (ref 36.0–46.0)
Hemoglobin: 11.9 g/dL — ABNORMAL LOW (ref 12.0–15.0)
Lymphocytes Relative: 23 % (ref 12.0–46.0)
Lymphs Abs: 1.7 10*3/uL (ref 0.7–4.0)
MCHC: 33.4 g/dL (ref 30.0–36.0)
MCV: 84.6 fl (ref 78.0–100.0)
Monocytes Absolute: 0.6 10*3/uL (ref 0.1–1.0)
Monocytes Relative: 8.4 % (ref 3.0–12.0)
Neutro Abs: 5 10*3/uL (ref 1.4–7.7)
Neutrophils Relative %: 65.7 % (ref 43.0–77.0)
Platelets: 313 10*3/uL (ref 150.0–400.0)
RBC: 4.21 Mil/uL (ref 3.87–5.11)
RDW: 14.4 % (ref 11.5–15.5)
WBC: 7.6 10*3/uL (ref 4.0–10.5)

## 2020-03-21 LAB — TSH: TSH: 4.89 u[IU]/mL — ABNORMAL HIGH (ref 0.35–4.50)

## 2020-03-21 NOTE — Progress Notes (Signed)
Subjective:    Patient ID: Loretta [name], female    DOB: 03-22-1991, 29 y.o.   MRN: [id number]  DOS:  03/21/2020 Type of visit - description: f/u  Today we talk about thyroid disease and anemia. She feels well. Does not take iron supplements regularly, simply forgets.   Review of Systems Denies nausea, vomiting, diarrhea.  No blood in the stools. Periods are heavy, monthly, heavy bleeding is typically the first 2 to 3 days of each menses  Past Medical History:  Diagnosis Date  . ACL tear 11/2014   left  . Asthma 03/27/2012  . Headache(784.0)    stress/migraine  . History of cardiac murmur    "innocent murmur" 2011 - states has resolved  . History of strabismus   . Hypothyroidism    has not taken medication since 09/2014  . Neurofibromatosis, type 1 (von Recklinghausen's disease) (HCC)   . Seasonal asthma    prn inhaler    Past Surgical History:  Procedure Laterality Date  . ANTERIOR CRUCIATE LIGAMENT REPAIR Left scheduled April 2016   Hamstring graft   . STRABISMUS SURGERY     age 46  . STRABISMUS SURGERY Right 03/01/2014   Procedure: REPAIR STRABISMUS BILATERAL;  Surgeon: Shara Blazing, MD;  Location: [facility];  Service: Ophthalmology;  Laterality: Right;  . TUMOR EXCISION Right as an infant   lower eyelid    Allergies as of 03/21/2020   No Known Allergies     Medication List       Accurate as of March 21, 2020 11:59 PM. If you have any questions, ask your nurse or doctor.        albuterol 108 (90 Base) MCG/ACT inhaler Commonly known as: Ventolin HFA Inhale 2 puffs into the lungs every 6 (six) hours as needed for wheezing or shortness of breath.   FISH OIL PO Take by mouth.   levothyroxine 88 MCG tablet Commonly known as: SYNTHROID Take 1 tablet (88 mcg total) by mouth daily before breakfast.   VITAMIN C PO Take 1 tablet by mouth daily.          Objective:   Physical Exam BP 121/67 (BP Location: Left Arm, Patient Position:  Sitting, Cuff Size: Normal)   Pulse 81   Temp (!) 96.7 F (35.9 C) (Temporal)   Resp 18   Ht 5\' 1"  (1.549 m)   Wt 156 lb (70.8 kg)   LMP 03/16/2020   SpO2 97%   BMI 29.48 kg/m  General:   Well developed, NAD, BMI noted. HEENT:  Normocephalic . Face symmetric, atraumatic Lungs:  CTA B Normal respiratory effort, no intercostal retractions, no accessory muscle use. Heart: RRR,  no murmur. Abdominal exam: Benign Lower extremities: no pretibial edema bilaterally  Skin: Not pale. Not jaundice Neurologic:  alert & oriented X3.  Speech normal, gait appropriate for age and unassisted Psych--  Cognition and judgment appear intact.  Cooperative with normal attention span and concentration.  Behavior appropriate. No anxious or depressed appearing.      Assessment     ASSESSMENT Hypothyroidism  GERD H/o asthma , prn albuterol, seasonal  Neurofibromatoses  FH: First cousin dies form a brain aneurysm Not on BP as off 08/2019  PLAN Hypothyroidism: Good med compliance, check a TSH Anemia, iron deficiency, see recent labs, likely due to heavy menses.  No GI symptoms.  We will recheck a CBC, encourage iron twice a day.  See AVS. Preventive care: Had a Covid vaccination RTC CPX 4  to 5 months   This visit occurred during the SARS-CoV-2 public health emergency.  Safety protocols were in place, including screening questions prior to the visit, additional usage of staff PPE, and extensive cleaning of exam room while observing appropriate contact time as indicated for disinfecting solutions.

## 2020-03-21 NOTE — Therapy (Signed)
[place], [place], [address] Phone: [phone]   Fax:  [phone]  Physical Therapy Evaluation  Patient Details  Name: Loretta [name] MRN: [id number] Date of Birth: Mar 04, 1991 Referring Provider (PT): Lynne Leader MD   Encounter Date: 03/21/2020   PT End of Session - 03/21/20 0939    Visit Number 1    Number of Visits 13    Date for PT Re-Evaluation 05/02/20    PT Start Time 0745    PT Stop Time 0835    PT Time Calculation (min) 50 min    Activity Tolerance Patient tolerated treatment well    Behavior During Therapy [facility] for tasks assessed/performed           Past Medical History:  Diagnosis Date  . ACL tear 11/2014   left  . Asthma 03/27/2012  . Headache(784.0)    stress/migraine  . History of cardiac murmur    "innocent murmur" 2011 - states has resolved  . History of strabismus   . Hypothyroidism    has not taken medication since 09/2014  . Neurofibromatosis, type 1 (von Recklinghausen's disease) ([place])   . Seasonal asthma    prn inhaler    Past Surgical History:  Procedure Laterality Date  . ANTERIOR CRUCIATE LIGAMENT REPAIR Left scheduled April 2016   Hamstring graft   . STRABISMUS SURGERY     age 29  . STRABISMUS SURGERY Right 03/01/2014   Procedure: REPAIR STRABISMUS BILATERAL;  Surgeon: Derry Skill, MD;  Location: [facility];  Service: Ophthalmology;  Laterality: Right;  . TUMOR EXCISION Right as an infant   lower eyelid    There were no vitals filed for this visit.    Subjective Assessment - 03/21/20 0751    Subjective pt is a 29 y. o with CC of bil ankle instabilty with hx of sprains.. with the most recent issue being the L ankle pain  She notes she was walking last weekend and twisted her L ankle and it got swollen. she saw the MD and he provided a boot and brace. Since onset the L anke pain has gotten better and has stopped wearing the boot. pt reports having  R shoulder pain that started about 5-6 months agowith no specific MOI.  She reports the symptos gradually worsened since onset. Pain stays mostly in the shoulder with no referral. she denies any hx of issues with R shoulder    Limitations Lifting    How long can you sit comfortably? unlimited    How long can you stand comfortably? unlimited    How long can you walk comfortably? unlmited    Diagnostic tests [place] 6/8 Impression: Ankle sprain and possible peroneal tendinitis, X-ray IMPRESSION:Lateral soft tissue swelling. No acute osseous findings.for the ankle , the shoulder [place] 3/16 Impression: Subacromial bursitis possible partial infraspinatus tear.  No complete significant rotator cuff tear.    Currently in Pain? Yes    Pain Score 1    at worst 5-6/10,   Pain Location Shoulder    Pain Orientation Right    Pain Descriptors / Indicators Sharp   pinch   Pain Type Chronic pain    Pain Onset More than a month ago    Pain Frequency Intermittent    Aggravating Factors  using the R UE, lifting/ carrying    Pain Relieving Factors move the shoulder around a little,    Effect of Pain on Daily Activities limited lifting  Multiple Pain Sites Yes    Pain Score 1   at worst bil ankles 4-5/10   Pain Location Ankle    Pain Orientation Right;Left   more R>L   Pain Descriptors / Indicators Aching    Pain Type Chronic pain    Pain Onset More than a month ago    Pain Frequency Intermittent    Aggravating Factors  rolling the ankle    Pain Relieving Factors stretches, and moving the ankle around,              [facility] Assessment - 03/21/20 0001      Assessment   Medical Diagnosis Sprain of left ankle, unspecified ligament, initial encounter , Acute pain of right shoulder, scapular dyskinesia    Referring Provider (PT) Clementeen GrahamEvan Corey MD    Onset Date/Surgical Date --   L ankle 1-2 weeks, R shoulder 5-6 months   Hand Dominance Left    Next MD Visit 03/28/2020    Prior Therapy yes    ACL repair      Precautions   Precaution Comments hold off on exercise for a few weeks      Restrictions   Weight Bearing Restrictions No      Balance Screen   Has the patient fallen in the past 6 months Yes    How many times? 1    Has the patient had a decrease in activity level because of a fear of falling?  No    Is the patient reluctant to leave their home because of a fear of falling?  No      Home [employer]   Living Environment Private residence    Living Arrangements Parent    Available Help at Discharge Family    Type of Home House    Home Access Stairs to enter    Entrance Stairs-Number of Steps 2    Entrance Stairs-Rails None    Home Layout Two level    Alternate Level Stairs-Number of Steps 16    Alternate Level Stairs-Rails Right   ascending   Home Equipment --   ankle brace     Prior Function   Level of Independence Independent    Vocation Full time employment   recreation therapist   Vocation Requirements standing/ walking  prolonged    Leisure exercise, running, dance, walk      Cognition   Overall Cognitive Status Within Functional Limits for tasks assessed      Observation/Other Assessments   Focus on Therapeutic Outcomes (FOTO)  L ankle 23% limited,    predicted 7% limited     Observation/Other Assessments-Edema    Edema Figure 8      Figure 8 Edema   Figure 8 - Right  50    Figure 8 - Left  50.4      Posture/Postural Control   Posture/Postural Control Postural limitations    Postural Limitations Rounded Shoulders;Forward head      ROM / Strength   AROM / PROM / Strength AROM;Strength;PROM      AROM   Overall AROM  Within functional limits for tasks performed    Overall AROM Comments decrease scapular control with R shoulder abduction/ flexoin    AROM Assessment Site Shoulder;Ankle    Right/Left Shoulder Right;Left    Right/Left Ankle Right;Left    Left Ankle Dorsiflexion 2    Left Ankle Plantar Flexion 56    Left Ankle Inversion 14    Left Ankle  Eversion 2  PROM   PROM Assessment Site Ankle    Right/Left Ankle Left    Left Ankle Dorsiflexion 8    Left Ankle Eversion 8      Strength   Strength Assessment Site Ankle;Shoulder;Hand    Right/Left Shoulder Right;Left    Right Shoulder Flexion 4-/5   increasd pain during testing   Right Shoulder Extension 5/5    Right Shoulder ABduction 4-/5    Right Shoulder Internal Rotation 4+/5    Right Shoulder External Rotation 4+/5    Left Shoulder Flexion 4-/5    Left Shoulder Extension 5/5    Left Shoulder ABduction 4-/5    Left Shoulder Internal Rotation 4/5    Left Shoulder External Rotation 4-/5    Right Hand Grip (lbs) 41.6   49, 40, 36   Left Hand Grip (lbs) 38.3   40,40,35   Right/Left Ankle Right;Left    Right Ankle Dorsiflexion 5/5    Right Ankle Plantar Flexion 5/5    Right Ankle Inversion 4+/5    Right Ankle Eversion 4+/5    Left Ankle Dorsiflexion 4+/5    Left Ankle Plantar Flexion 5/5    Left Ankle Inversion 5/5    Left Ankle Eversion 4+/5      Palpation   Palpation comment TTP along the AC joint, the proximal long head bicep tendon, and sub-acromial space. multiple trigger points noted in the R upper trap/ levator scapuale.   For L ankle TTP along the ATFL      Special Tests    Special Tests Rotator Cuff Impingement;Ankle/Foot Special Tests    Rotator Cuff Impingment tests Full Can test;Empty Can test;other    Ankle/Foot Special Tests  Anterior Drawer Test;Talar Tilt Test      Empty Can test   Findings Positive    Side Right      Full Can test   Findings Negative    Side Right      other   Findings Positive    Side Right    Comments scapular assist on the R    reduced pain during testing with abd/ flexoin     Anterior Drawer Test   Findings Positive    Side  Left    Comments popping      Talar Tilt Test    Findings Negative    Side  Left      Ambulation/Gait   Ambulation/Gait Yes    Gait Pattern Step-through pattern;Antalgic   increased  lateral sway noted                      Objective measurements completed on examination: See above findings.       [facility] Adult PT Treatment/Exercise - 03/21/20 0001      Exercises   Exercises Shoulder;Ankle                  PT Education - 03/21/20 0938    Education Details evaluation findings, POC, goals, HEp with proper form / rationale, shoulder biomechancis / scapulohumaral rhythm    Person(s) Educated Patient    Methods Explanation;Verbal cues;Handout    Comprehension Verbalized understanding;Verbal cues required            PT Short Term Goals - 03/21/20 1008      PT SHORT TERM GOAL #1   Title pt to be I with inital HEP    Time 3    Period Weeks    Status New    Target Date  04/11/20      PT SHORT TERM GOAL #2   Title pt will demonstrate full shoulder ROM with no pain    Time 3    Period Weeks    Status New    Target Date 04/11/20      PT SHORT TERM GOAL #3   Title patient will decrease bilateral ankle edema by >/= .8 cm to promote ankle ROM    Time 3    Period Weeks    Status New    Target Date 04/11/20             PT Long Term Goals - 03/21/20 1142      PT LONG TERM GOAL #1   Title patient will demonstrate >/= 30 sec SLS with minimal postural sway for improvement in functional ankle stability    Time 6    Period Weeks    Status New    Target Date 05/02/20      PT LONG TERM GOAL #2   Title Patient will improve shoulder strength to >/= 4+/5 bilaterally to improve scapular motion and decrease pain.    Time 6    Period Weeks    Status New    Target Date 05/02/20      PT LONG TERM GOAL #3   Title patient will get back to running and strength training with no report of instability or pain, per patient's personal goal    Time 6    Period Weeks    Status New    Target Date 05/02/20      PT LONG TERM GOAL #4   Title patient will improve FOTO score to </= 7% limitation to demonstrate improvement in function    Time 6     Period Weeks    Status New    Target Date 05/02/20      PT LONG TERM GOAL #5   Title patient will be independent will all HEP given to maintain and progress current level of function    Time 6    Period Weeks    Status New    Target Date 05/02/20                  Plan - 03/21/20 0943    Clinical Impression Statement pt presnts to OPPT with CC of chroinc bil ankle instability with recent sprain in the L occuring 1-2weeks and the R occuring last week. R shoulder pain that started with insidious onset 5-6 months ago. She demonstrates functional shoulder ROM with mild scapular dyskinesia noted, and functional ankle ROM except for limited DF actively on the L. limted strength in bil shoulders with pain noted during resisted flexion/ abduction in the R, and weakness of L PF's. special testing for the shoulder suggest high liklihood of impingement, and ankle specital testing suggesting high liklihood of ATFL invovlement. She would benefit from physicla therapy to decrease R shoulder pain, improve gross shoulder strength, increase ankle strength/ stability, and maximize overall function by addressing the deficits listed.    Personal Factors and Comorbidities Comorbidity 1    Comorbidities hx of asthma, and frequent hx of bil ankle sprains    Examination-Activity Limitations Lift    Stability/Clinical Decision Making Evolving/Moderate complexity    Clinical Decision Making Moderate    Rehab Potential Good    PT Frequency 2x / week    PT Duration 6 weeks    PT Treatment/Interventions ADLs/Self Care Home Management;Cryotherapy;Electrical Stimulation;Iontophoresis 4mg /ml Dexamethasone;Moist Heat;Ultrasound;Therapeutic activities;Therapeutic exercise;Balance training;Stair training;Gait  training;Patient/family education;Neuromuscular re-education;Manual techniques;Passive range of motion;Dry needling;Taping    PT Next Visit Plan review / update HEP PRN, gross ankle strengthening, ankle post mobs  to promtoe DF, ankle motor control and balance, Shoulder STW along the upper trap. scapular upward assist mobs,scapular stability exercise    PT Home Exercise Plan [phone] - upper trap stretch, levator scapulae stretch, ceiling punches, shoulder IR/ER, ankle 4- way strength, heel slide (seated)    Consulted and Agree with Plan of Care Patient           Patient will benefit from skilled therapeutic intervention in order to improve the following deficits and impairments:  Improper body mechanics, Increased muscle spasms, Decreased strength, Postural dysfunction, Abnormal gait, Pain, Decreased activity tolerance, Decreased balance, Decreased endurance, Decreased range of motion  Visit Diagnosis: Chronic right shoulder pain  Muscle weakness (generalized)  Pain in left ankle and joints of left foot  Unsteadiness on feet     Problem List Patient Active Problem List   Diagnosis Date Noted  . Chronic or recurrent subluxation of peroneal tendon of right foot 04/16/2019  . PCP NOTES >>>>>>>>>>>>>>>>>>>>>>>>>>>>>> 07/29/2016  . GERD (gastroesophageal reflux disease) 11/07/2013  . Neurofibromatosis, type 1 (von Recklinghausen's disease) ([facility])   . Galactorrhea on left side 02/13/2013  . Hypothyroidism 05/05/2012  . Dysmenorrhea 04/04/2012  . Weight gain 04/04/2012  . Asthma 03/27/2012  . Allergic rhinitis 03/27/2012  . Annual physical exam 03/27/2012    Lowell Bouton SPT 03/21/2020, 11:58 AM  [facility] [address] [place], [place], [address] Phone: [phone]   Fax:  [phone]  Name: Loretta [name] MRN: [id number] Date of Birth: 05-20-1991

## 2020-03-21 NOTE — Patient Instructions (Signed)
Take your thyroid medication by itself.  Take iron twice a day on an empty stomach, if you like to, take it along with vitamin C capsule.  GO TO THE LAB : Get the blood work     GO TO THE FRONT DESK, PLEASE SCHEDULE YOUR APPOINTMENTS Come back for   a physical exam in 4 to 5 months

## 2020-03-22 NOTE — Assessment & Plan Note (Signed)
Hypothyroidism: Good med compliance, check a TSH Anemia, iron deficiency, see recent labs, likely due to heavy menses.  No GI symptoms.  We will recheck a CBC, encourage iron twice a day.  See AVS. Preventive care: Had a Covid vaccination RTC CPX 4 to 5 months

## 2020-03-25 MED ORDER — IRON 325 (65 FE) MG PO TABS: 1.0000 | ORAL_TABLET | Freq: Two times a day (BID) | ORAL | 6 refills | Status: DC

## 2020-03-25 MED ORDER — LEVOTHYROXINE SODIUM 112 MCG PO TABS
112.0000 ug | ORAL_TABLET | Freq: Every day | ORAL | 1 refills | Status: DC
Start: 2020-03-25 — End: 2020-04-28

## 2020-03-25 NOTE — Addendum Note (Signed)
Addended byConrad [place] D on: 03/25/2020 03:18 PM   Modules accepted: Orders

## 2020-03-27 ENCOUNTER — Encounter: Payer: Self-pay | Admitting: Physical Therapy

## 2020-03-28 ENCOUNTER — Ambulatory Visit: Payer: BC Managed Care – PPO | Admitting: Family Medicine

## 2020-04-08 ENCOUNTER — Encounter: Payer: Self-pay | Admitting: Physical Therapy

## 2020-04-08 ENCOUNTER — Other Ambulatory Visit: Payer: Self-pay

## 2020-04-08 ENCOUNTER — Ambulatory Visit: Payer: BC Managed Care – PPO | Attending: Family Medicine | Admitting: Physical Therapy

## 2020-04-08 DIAGNOSIS — M25572 Pain in left ankle and joints of left foot: Secondary | ICD-10-CM | POA: Insufficient documentation

## 2020-04-08 DIAGNOSIS — G8929 Other chronic pain: Secondary | ICD-10-CM | POA: Diagnosis present

## 2020-04-08 DIAGNOSIS — M25511 Pain in right shoulder: Secondary | ICD-10-CM | POA: Diagnosis not present

## 2020-04-08 DIAGNOSIS — R2681 Unsteadiness on feet: Secondary | ICD-10-CM | POA: Insufficient documentation

## 2020-04-08 DIAGNOSIS — M6281 Muscle weakness (generalized): Secondary | ICD-10-CM | POA: Diagnosis present

## 2020-04-08 NOTE — Therapy (Signed)
[facility] [address] [place], [place], [address] Phone: [phone]   Fax:  [phone]  Physical Therapy Treatment  Patient Details  Name: Loretta [name] MRN: [id number] Date of Birth: 1991/01/23 Referring Provider (PT): Clementeen Graham MD   Encounter Date: 04/08/2020   PT End of Session - 04/08/20 0852    Visit Number 2    Number of Visits 13    Date for PT Re-Evaluation 05/02/20    PT Start Time 0803    PT Stop Time 0844    PT Time Calculation (min) 41 min    Activity Tolerance Patient tolerated treatment well    Behavior During Therapy [facility] for tasks assessed/performed           Past Medical History:  Diagnosis Date  . ACL tear 11/2014   left  . Asthma 03/27/2012  . Headache(784.0)    stress/migraine  . History of cardiac murmur    "innocent murmur" 2011 - states has resolved  . History of strabismus   . Hypothyroidism    has not taken medication since 09/2014  . Neurofibromatosis, type 1 (von Recklinghausen's disease) ([facility])   . Seasonal asthma    prn inhaler    Past Surgical History:  Procedure Laterality Date  . ANTERIOR CRUCIATE LIGAMENT REPAIR Left scheduled April 2016   Hamstring graft   . STRABISMUS SURGERY     age 36  . STRABISMUS SURGERY Right 03/01/2014   Procedure: REPAIR STRABISMUS BILATERAL;  Surgeon: Shara Blazing, MD;  Location: [facility];  Service: Ophthalmology;  Laterality: Right;  . TUMOR EXCISION Right as an infant   lower eyelid    There were no vitals filed for this visit.   Subjective Assessment - 04/08/20 0806    Subjective Patient reports her ankle is feeling better since last session. Her R ankle is giving her more issues than then L currently. Her R shoulder is sore today. She states she was doing a lot of lifting yesterday with her shoulder.    Limitations Lifting    How long can you sit comfortably? unlimited    How long can you stand comfortably? unlimited     How long can you walk comfortably? unlmited    Diagnostic tests [place] 6/8 Impression: Ankle sprain and possible peroneal tendinitis, X-ray IMPRESSION:Lateral soft tissue swelling. No acute osseous findings.for the ankle , the shoulder [place] 3/16 Impression: Subacromial bursitis possible partial infraspinatus tear.  No complete significant rotator cuff tear.    Pain Score 4     Pain Location Shoulder    Pain Orientation Right    Pain Descriptors / Indicators Sharp    Pain Type Chronic pain    Pain Onset More than a month ago    Pain Frequency Intermittent    Aggravating Factors  using right UE    Pain Relieving Factors moving the shoulder    Effect of Pain on Daily Activities limited lifting    Multiple Pain Sites No                             [facility] Adult PT Treatment/Exercise - 04/08/20 0001      Shoulder Exercises: Standing   Row Limitations 2x10   red   Other Standing Exercises Push up plus 2x10 against wall      Manual Therapy   Manual Therapy Soft tissue mobilization;Joint mobilization    Joint Mobilization Grade II-III inferior and  posterior glides    Soft tissue mobilization to upper traps       Ankle Exercises: Aerobic   Nustep L4x54min UE/LE      Ankle Exercises: Standing   SLS 3x10sec each leg                  PT Education - 04/08/20 1319    Education Details HEP, symptom management; gym exercises she can perform that will not aggravate shoulder or ankle; dry needling    Person(s) Educated Patient    Methods Explanation;Demonstration;Tactile cues;Verbal cues    Comprehension Tactile cues required;Verbal cues required;Returned demonstration;Verbalized understanding            PT Short Term Goals - 03/21/20 1008      PT SHORT TERM GOAL #1   Title pt to be I with inital HEP    Time 3    Period Weeks    Status New    Target Date 04/11/20      PT SHORT TERM GOAL #2   Title pt will demonstrate full shoulder ROM with no pain    Time 3     Period Weeks    Status New    Target Date 04/11/20      PT SHORT TERM GOAL #3   Title patient will decrease bilateral ankle edema by >/= .8 cm to promote ankle ROM    Time 3    Period Weeks    Status New    Target Date 04/11/20             PT Long Term Goals - 03/21/20 1142      PT LONG TERM GOAL #1   Title patient will demonstrate >/= 30 sec SLS with minimal postural sway for improvement in functional ankle stability    Time 6    Period Weeks    Status New    Target Date 05/02/20      PT LONG TERM GOAL #2   Title Patient will improve shoulder strength to >/= 4+/5 bilaterally to improve scapular motion and decrease pain.    Time 6    Period Weeks    Status New    Target Date 05/02/20      PT LONG TERM GOAL #3   Title patient will get back to running and strength training with no report of instability or pain, per patient's personal goal    Time 6    Period Weeks    Status New    Target Date 05/02/20      PT LONG TERM GOAL #4   Title patient will improve FOTO score to </= 7% limitation to demonstrate improvement in function    Time 6    Period Weeks    Status New    Target Date 05/02/20      PT LONG TERM GOAL #5   Title patient will be independent will all HEP given to maintain and progress current level of function    Time 6    Period Weeks    Status New    Target Date 05/02/20                 Plan - 04/08/20 1022    Clinical Impression Statement Therapy performed GH inferior and posterior glides to decrease pain with shoulder ROM. Patient presented with increased tightness in her upper traps. STW was done to release the trigger points and therapy discussed dry needling with pt. Therapy worked on scapular strengthening to increase  strenght of the serratus anterior and decrease pain with GH motion. SLS was performed bilaterally to improve ankle stability. Therapy advised patient to hold off on running for the time being until ankle strength and stability  was improved. Patinet tolerated treatment well, and stated she will work on new home exercises.    Personal Factors and Comorbidities Comorbidity 1    Comorbidities hx of asthma, and frequent hx of bil ankle sprains    Examination-Activity Limitations Lift    Stability/Clinical Decision Making Evolving/Moderate complexity    Clinical Decision Making Moderate    Rehab Potential Good    PT Frequency 2x / week    PT Duration 6 weeks    PT Treatment/Interventions ADLs/Self Care Home Management;Cryotherapy;Electrical Stimulation;Iontophoresis 4mg /ml Dexamethasone;Moist Heat;Ultrasound;Therapeutic activities;Therapeutic exercise;Balance training;Stair training;Gait training;Patient/family education;Neuromuscular re-education;Manual techniques;Passive range of motion;Dry needling;Taping    PT Next Visit Plan review / update HEP PRN, gross ankle strengthening, ankle post mobs to promtoe DF, ankle motor control and balance, Shoulder STW along the upper trap. scapular upward assist mobs,scapular stability exercise    PT Home Exercise Plan [phone] - upper trap stretch, levator scapulae stretch, ceiling punches, shoulder IR/ER, ankle 4- way strength, heel slide (seated); SLS bilat, push up plus, rows    Consulted and Agree with Plan of Care Patient           Patient will benefit from skilled therapeutic intervention in order to improve the following deficits and impairments:  Improper body mechanics, Increased muscle spasms, Decreased strength, Postural dysfunction, Abnormal gait, Pain, Decreased activity tolerance, Decreased balance, Decreased endurance, Decreased range of motion  Visit Diagnosis: Chronic right shoulder pain  Muscle weakness (generalized)  Pain in left ankle and joints of left foot  Unsteadiness on feet     Problem List Patient Active Problem List   Diagnosis Date Noted  . Chronic or recurrent subluxation of peroneal tendon of right foot 04/16/2019  . PCP NOTES  >>>>>>>>>>>>>>>>>>>>>>>>>>>>>> 07/29/2016  . GERD (gastroesophageal reflux disease) 11/07/2013  . Neurofibromatosis, type 1 (von Recklinghausen's disease) ([facility])   . Galactorrhea on left side 02/13/2013  . Hypothyroidism 05/05/2012  . Dysmenorrhea 04/04/2012  . Weight gain 04/04/2012  . Asthma 03/27/2012  . Allergic rhinitis 03/27/2012  . Annual physical exam 03/27/2012    05/28/2012 PT DPT  04/08/2020, 2:30 PM   04/10/2020 SPT  04/08/2020  During this treatment session, the therapist was present, participating in and directing the treatment.   [facility] [address] [place], [place], [place] Phone: [phone]   Fax:  [phone]  Name: Loretta [name] MRN: [id number] Sachs Date of Birth: [birth year]

## 2020-04-08 NOTE — Patient Instructions (Signed)
Access Code: [email or web address] by: Onalee Hua CarrollExercises  Standing Single Leg Stance with Counter Support - 1 x daily - 7 x weekly - 3 sets - 10 reps  Wall Push Up with Plus - 1 x daily - 7 x weekly - 3 sets - 10 reps  Standing Bilateral Low Shoulder Row with Anchored Resistance - 1 x daily - 7 x weekly - 3 sets - 10 reps

## 2020-04-11 ENCOUNTER — Encounter: Payer: Self-pay | Admitting: Physical Therapy

## 2020-04-11 ENCOUNTER — Ambulatory Visit: Payer: BC Managed Care – PPO | Admitting: Physical Therapy

## 2020-04-11 ENCOUNTER — Other Ambulatory Visit: Payer: Self-pay

## 2020-04-11 DIAGNOSIS — M25572 Pain in left ankle and joints of left foot: Secondary | ICD-10-CM

## 2020-04-11 DIAGNOSIS — R2681 Unsteadiness on feet: Secondary | ICD-10-CM

## 2020-04-11 DIAGNOSIS — M6281 Muscle weakness (generalized): Secondary | ICD-10-CM

## 2020-04-11 DIAGNOSIS — G8929 Other chronic pain: Secondary | ICD-10-CM

## 2020-04-11 DIAGNOSIS — M25511 Pain in right shoulder: Secondary | ICD-10-CM | POA: Diagnosis not present

## 2020-04-11 NOTE — Therapy (Signed)
[facility] [address] [place], [place], [address] Phone: [phone]   Fax:  [phone]  Physical Therapy Treatment  Patient Details  Name: Loretta [name] MRN: [id number] Date of Birth: 12-Sep-1991 Referring Provider (PT): Clementeen Graham MD   Encounter Date: 04/11/2020   PT End of Session - 04/11/20 0747    Visit Number 3    Number of Visits 13    Date for PT Re-Evaluation 05/02/20    PT Start Time 0746    PT Stop Time 0830    PT Time Calculation (min) 44 min    Activity Tolerance Patient tolerated treatment well    Behavior During Therapy [facility] for tasks assessed/performed           Past Medical History:  Diagnosis Date   ACL tear 11/2014   left   Asthma 03/27/2012   Headache(784.0)    stress/migraine   History of cardiac murmur    "innocent murmur" 2011 - states has resolved   History of strabismus    Hypothyroidism    has not taken medication since 09/2014   Neurofibromatosis, type 1 (von Recklinghausen's disease) (HCC)    Seasonal asthma    prn inhaler    Past Surgical History:  Procedure Laterality Date   ANTERIOR CRUCIATE LIGAMENT REPAIR Left scheduled April 2016   Hamstring graft    STRABISMUS SURGERY     age 74   STRABISMUS SURGERY Right 03/01/2014   Procedure: REPAIR STRABISMUS BILATERAL;  Surgeon: Shara Blazing, MD;  Location: [facility];  Service: Ophthalmology;  Laterality: Right;   TUMOR EXCISION Right as an infant   lower eyelid    There were no vitals filed for this visit.   Subjective Assessment - 04/11/20 0747    Subjective "The ankles are getting better. the shoulder is coming along, I am still have some soreness."    Patient Stated Goals to decrease shoulder pain, stabilize ankles    Currently in Pain? Yes    Pain Score 2     Pain Location Shoulder    Pain Orientation Right    Pain Descriptors / Indicators Aching    Aggravating Factors  pushing heavy doors  forward/ and pulling back    Pain Score 0    Pain Location Ankle    Pain Orientation Right;Left    Pain Type Chronic pain    Pain Onset More than a month ago    Pain Frequency Intermittent    Aggravating Factors  rolling the ankle              [facility] PT Assessment - 04/11/20 0001      Assessment   Medical Diagnosis Sprain of left ankle, unspecified ligament, initial encounter , Acute pain of right shoulder, scapular dyskinesia    Referring Provider (PT) Clementeen Graham MD                         [facility] Adult PT Treatment/Exercise - 04/11/20 0001      Self-Care   Self-Care Other Self-Care Comments    Other Self-Care Comments  appropriate ankle brace with straps and hwere she can purchase one.      Shoulder Exercises: Supine   Protraction Strengthening;Right;15 reps   sustainted protraction with CW/CCW circles x 2 sets     Shoulder Exercises: Standing   Other Standing Exercises Push up plus 1 x10 against wall    Other Standing Exercises lower trap wall y's  1 x 12      Manual Therapy   Manual therapy comments MTPR along R upper trap / levator scapuale    Joint Mobilization grade III R rib mobs     Soft tissue mobilization IASTM over R upper trap/ levator scapuale      Ankle Exercises: Seated   BAPS Level 2;Sitting;10 reps   DF/PF, Inversion/ Everions and CW/CCW cirlces                 PT Education - 04/11/20 0820    Education Details reviewed HEP and added standing calf stretch. Addressing pt questions regarding running and to try inteval running walking and 1 min running at a comforable a speed but not to over do it.    Person(s) Educated Patient    Methods Explanation;Verbal cues;Handout    Comprehension Verbalized understanding;Verbal cues required            PT Short Term Goals - 03/21/20 1008      PT SHORT TERM GOAL #1   Title pt to be I with inital HEP    Time 3    Period Weeks    Status New    Target Date 04/11/20      PT SHORT  TERM GOAL #2   Title pt will demonstrate full shoulder ROM with no pain    Time 3    Period Weeks    Status New    Target Date 04/11/20      PT SHORT TERM GOAL #3   Title patient will decrease bilateral ankle edema by >/= .8 cm to promote ankle ROM    Time 3    Period Weeks    Status New    Target Date 04/11/20             PT Long Term Goals - 03/21/20 1142      PT LONG TERM GOAL #1   Title patient will demonstrate >/= 30 sec SLS with minimal postural sway for improvement in functional ankle stability    Time 6    Period Weeks    Status New    Target Date 05/02/20      PT LONG TERM GOAL #2   Title Patient will improve shoulder strength to >/= 4+/5 bilaterally to improve scapular motion and decrease pain.    Time 6    Period Weeks    Status New    Target Date 05/02/20      PT LONG TERM GOAL #3   Title patient will get back to running and strength training with no report of instability or pain, per patient's personal goal    Time 6    Period Weeks    Status New    Target Date 05/02/20      PT LONG TERM GOAL #4   Title patient will improve FOTO score to </= 7% limitation to demonstrate improvement in function    Time 6    Period Weeks    Status New    Target Date 05/02/20      PT LONG TERM GOAL #5   Title patient will be independent will all HEP given to maintain and progress current level of function    Time 6    Period Weeks    Status New    Target Date 05/02/20                 Plan - 04/11/20 0852    Clinical Impression Statement pt reports  the ankles are doing better but the R is still worse then the L. continued work on motor contorl using [employer] bil. continued STW along the R upper trap/ levator scpauale followed with scapular stabilizing exercises which she did well with. addressing questions regarding running discussed interval running but starting back slowly in a controlled environment.    PT Treatment/Interventions ADLs/Self Care Home  Management;Cryotherapy;Electrical Stimulation;Iontophoresis 4mg /ml Dexamethasone;Moist Heat;Ultrasound;Therapeutic activities;Therapeutic exercise;Balance training;Stair training;Gait training;Patient/family education;Neuromuscular re-education;Manual techniques;Passive range of motion;Dry needling;Taping    PT Next Visit Plan review / update HEP PRN, gross ankle strengthening, ankle motor control and balance, Shoulder STW along the upper trap. scapular upward assist mobs,scapular stability exercise    PT Home Exercise Plan [phone] - upper trap stretch, levator scapulae stretch, ceiling punches, shoulder IR/ER, ankle 4- way strength, heel slide (seated); SLS bilat, push up plus, rows, lower trap y's.    Consulted and Agree with Plan of Care Patient           Patient will benefit from skilled therapeutic intervention in order to improve the following deficits and impairments:  Improper body mechanics, Increased muscle spasms, Decreased strength, Postural dysfunction, Abnormal gait, Pain, Decreased activity tolerance, Decreased balance, Decreased endurance, Decreased range of motion  Visit Diagnosis: Chronic right shoulder pain  Muscle weakness (generalized)  Pain in left ankle and joints of left foot  Unsteadiness on feet     Problem List Patient Active Problem List   Diagnosis Date Noted   Chronic or recurrent subluxation of peroneal tendon of right foot 04/16/2019   PCP NOTES >>>>>>>>>>>>>>>>>>>>>>>>>>>>>> 07/29/2016   GERD (gastroesophageal reflux disease) 11/07/2013   Neurofibromatosis, type 1 (von Recklinghausen's disease) (HCC)    Galactorrhea on left side 02/13/2013   Hypothyroidism 05/05/2012   Dysmenorrhea 04/04/2012   Weight gain 04/04/2012   Asthma 03/27/2012   Allergic rhinitis 03/27/2012   Annual physical exam 03/27/2012    05/28/2012 PT, DPT, LAT, ATC  04/11/20  8:56 AM      [facility] [address] [place], [place], [place] Phone: [phone]   Fax:  [phone]  Name: Aysiah Jurado MRN: Larwance Sachs Date of Birth: 1990/12/31

## 2020-04-15 ENCOUNTER — Encounter: Payer: Self-pay | Admitting: Physical Therapy

## 2020-04-15 ENCOUNTER — Ambulatory Visit: Payer: BC Managed Care – PPO | Admitting: Physical Therapy

## 2020-04-15 ENCOUNTER — Other Ambulatory Visit: Payer: Self-pay

## 2020-04-15 DIAGNOSIS — R2681 Unsteadiness on feet: Secondary | ICD-10-CM

## 2020-04-15 DIAGNOSIS — M25511 Pain in right shoulder: Secondary | ICD-10-CM

## 2020-04-15 DIAGNOSIS — M25572 Pain in left ankle and joints of left foot: Secondary | ICD-10-CM

## 2020-04-15 DIAGNOSIS — M6281 Muscle weakness (generalized): Secondary | ICD-10-CM

## 2020-04-15 NOTE — Therapy (Signed)
[facility] [address] [place], [place], [address] Phone: [phone]   Fax:  [phone]  Physical Therapy Treatment  Patient Details  Name: Loretta [name] MRN: [id number] Date of Birth: 20-Jan-1991 Referring Provider (PT): Clementeen Graham MD   Encounter Date: 04/15/2020   PT End of Session - 04/15/20 0807    Visit Number 4    Number of Visits 13    Date for PT Re-Evaluation 05/02/20    PT Start Time 0803    PT Stop Time 0844    PT Time Calculation (min) 41 min    Activity Tolerance Patient tolerated treatment well    Behavior During Therapy [facility] for tasks assessed/performed           Past Medical History:  Diagnosis Date  . ACL tear 11/2014   left  . Asthma 03/27/2012  . Headache(784.0)    stress/migraine  . History of cardiac murmur    "innocent murmur" 2011 - states has resolved  . History of strabismus   . Hypothyroidism    has not taken medication since 09/2014  . Neurofibromatosis, type 1 (von Recklinghausen's disease) (HCC)   . Seasonal asthma    prn inhaler    Past Surgical History:  Procedure Laterality Date  . ANTERIOR CRUCIATE LIGAMENT REPAIR Left scheduled April 2016   Hamstring graft   . STRABISMUS SURGERY     age 57  . STRABISMUS SURGERY Right 03/01/2014   Procedure: REPAIR STRABISMUS BILATERAL;  Surgeon: Shara Blazing, MD;  Location: [facility];  Service: Ophthalmology;  Laterality: Right;  . TUMOR EXCISION Right as an infant   lower eyelid    There were no vitals filed for this visit.   Subjective Assessment - 04/15/20 0805    Subjective "The shoulder gives me the most problems. 3/10 pain today. I rolled my R ankle yesterday, very little pain, and no swelling."    How long can you sit comfortably? unlimited    How long can you walk comfortably? unlmited    Diagnostic tests [place] 6/8 Impression: Ankle sprain and possible peroneal tendinitis, X-ray IMPRESSION:Lateral soft tissue  swelling. No acute osseous findings.for the ankle , the shoulder [place] 3/16 Impression: Subacromial bursitis possible partial infraspinatus tear.  No complete significant rotator cuff tear.    Patient Stated Goals to decrease shoulder pain, stabilize ankles    Currently in Pain? Yes    Pain Score 3     Pain Location Shoulder    Pain Orientation Right    Pain Descriptors / Indicators Aching    Pain Type Chronic pain    Pain Score 0    Pain Location Ankle    Pain Orientation Right;Left    Pain Descriptors / Indicators Aching    Pain Type Chronic pain    Pain Onset More than a month ago    Pain Frequency Intermittent                             [facility] Adult PT Treatment/Exercise - 04/15/20 0001      Shoulder Exercises: Supine   Protraction Strengthening;10 reps   x 2 sets 1 lb weights   Protraction Limitations Shoulder Alphabet x1 with 1 lb; Rhythmic Stabilization x10 reps      Shoulder Exercises: Standing   Other Standing Exercises Push up plus 2x10 against wall    Other Standing Exercises lower trap wall y's 2x10  Ankle Exercises: Standing   Other Standing Ankle Exercises Marches with theraband around feet    red band     Ankle Exercises: Aerobic   Tread Mill 4 min walking; 1 min running   medial heel whip; bilat LE circumduction in running   Nustep L4x11min UE/LE                  PT Education - 04/15/20 0843    Education Details reviewed and updated HEP, running/walking mehcanics    Person(s) Educated Patient    Methods Explanation;Demonstration;Verbal cues;Handout    Comprehension Verbalized understanding;Verbal cues required            PT Short Term Goals - 03/21/20 1008      PT SHORT TERM GOAL #1   Title pt to be I with inital HEP    Time 3    Period Weeks    Status New    Target Date 04/11/20      PT SHORT TERM GOAL #2   Title pt will demonstrate full shoulder ROM with no pain    Time 3    Period Weeks    Status New    Target  Date 04/11/20      PT SHORT TERM GOAL #3   Title patient will decrease bilateral ankle edema by >/= .8 cm to promote ankle ROM    Time 3    Period Weeks    Status New    Target Date 04/11/20             PT Long Term Goals - 03/21/20 1142      PT LONG TERM GOAL #1   Title patient will demonstrate >/= 30 sec SLS with minimal postural sway for improvement in functional ankle stability    Time 6    Period Weeks    Status New    Target Date 05/02/20      PT LONG TERM GOAL #2   Title Patient will improve shoulder strength to >/= 4+/5 bilaterally to improve scapular motion and decrease pain.    Time 6    Period Weeks    Status New    Target Date 05/02/20      PT LONG TERM GOAL #3   Title patient will get back to running and strength training with no report of instability or pain, per patient's personal goal    Time 6    Period Weeks    Status New    Target Date 05/02/20      PT LONG TERM GOAL #4   Title patient will improve FOTO score to </= 7% limitation to demonstrate improvement in function    Time 6    Period Weeks    Status New    Target Date 05/02/20      PT LONG TERM GOAL #5   Title patient will be independent will all HEP given to maintain and progress current level of function    Time 6    Period Weeks    Status New    Target Date 05/02/20                 Plan - 04/15/20 0845    Clinical Impression Statement Patient contiues to have tightness in her upper traps, STW was done to release trigger points. Therapy worked on Nature conservation officer and stabilization to help promote proper GH movement. She rolled her R ankle yesterday. Patient worked on interval walking/running on treadmill, presenting with a R heel  whip which was exaggerated in running leading to increased probability of rolling ankle. Therapy worked on standing marches with red theraband around feet to promote proper forward motion of hip and ankle DF. Patient tolerated treatment well and  reports no increase in pain.    Personal Factors and Comorbidities Comorbidity 1    Comorbidities hx of asthma, and frequent hx of bil ankle sprains    Examination-Activity Limitations Lift    Stability/Clinical Decision Making Evolving/Moderate complexity    Clinical Decision Making Moderate    Rehab Potential Good    PT Frequency 2x / week    PT Duration 6 weeks    PT Treatment/Interventions ADLs/Self Care Home Management;Cryotherapy;Electrical Stimulation;Iontophoresis 4mg /ml Dexamethasone;Moist Heat;Ultrasound;Therapeutic activities;Therapeutic exercise;Balance training;Stair training;Gait training;Patient/family education;Neuromuscular re-education;Manual techniques;Passive range of motion;Dry needling;Taping    PT Next Visit Plan review / update HEP PRN, gross ankle strengthening, ankle motor control and balance, Shoulder STW along the upper trap. scapular upward assist mobs,scapular stability exercise    PT Home Exercise Plan [phone] - upper trap stretch, levator scapulae stretch, ceiling punches, shoulder IR/ER, ankle 4- way strength, heel slide (seated); SLS bilat, push up plus, rows, lower trap y's; marching with band around feet    Consulted and Agree with Plan of Care Patient           Patient will benefit from skilled therapeutic intervention in order to improve the following deficits and impairments:  Improper body mechanics, Increased muscle spasms, Decreased strength, Postural dysfunction, Abnormal gait, Pain, Decreased activity tolerance, Decreased balance, Decreased endurance, Decreased range of motion  Visit Diagnosis: Chronic right shoulder pain  Muscle weakness (generalized)  Pain in left ankle and joints of left foot  Unsteadiness on feet     Problem List Patient Active Problem List   Diagnosis Date Noted  . Chronic or recurrent subluxation of peroneal tendon of right foot 04/16/2019  . PCP NOTES >>>>>>>>>>>>>>>>>>>>>>>>>>>>>> 07/29/2016  . GERD  (gastroesophageal reflux disease) 11/07/2013  . Neurofibromatosis, type 1 (von Recklinghausen's disease) (HCC)   . Galactorrhea on left side 02/13/2013  . Hypothyroidism 05/05/2012  . Dysmenorrhea 04/04/2012  . Weight gain 04/04/2012  . Asthma 03/27/2012  . Allergic rhinitis 03/27/2012  . Annual physical exam 03/27/2012   05/28/2012 PT, DPT, LAT, ATC  04/15/20  12:42 PM      [facility] [address] [place], [place], [place] Phone: [phone]   Fax:  [phone]  Name: Ensley Blas MRN: Larwance Sachs Date of Birth: 1991-04-18

## 2020-04-18 ENCOUNTER — Ambulatory Visit: Payer: BC Managed Care – PPO | Admitting: Physical Therapy

## 2020-04-18 ENCOUNTER — Other Ambulatory Visit: Payer: Self-pay

## 2020-04-18 ENCOUNTER — Encounter: Payer: Self-pay | Admitting: Physical Therapy

## 2020-04-18 DIAGNOSIS — M25572 Pain in left ankle and joints of left foot: Secondary | ICD-10-CM

## 2020-04-18 DIAGNOSIS — M25511 Pain in right shoulder: Secondary | ICD-10-CM

## 2020-04-18 DIAGNOSIS — M6281 Muscle weakness (generalized): Secondary | ICD-10-CM

## 2020-04-18 DIAGNOSIS — G8929 Other chronic pain: Secondary | ICD-10-CM

## 2020-04-18 DIAGNOSIS — R2681 Unsteadiness on feet: Secondary | ICD-10-CM

## 2020-04-18 NOTE — Therapy (Signed)
[facility] [address] [place], [place], [address] Phone: [phone]   Fax:  [phone]  Physical Therapy Treatment  Patient Details  Name: Loretta [name] MRN: [id number] Date of Birth: 1991/06/16 Referring Provider (PT): Clementeen Graham MD   Encounter Date: 04/18/2020   PT End of Session - 04/18/20 0747    Visit Number 5    Number of Visits 13    Date for PT Re-Evaluation 05/02/20    PT Start Time 0747    PT Stop Time 0829    PT Time Calculation (min) 42 min    Activity Tolerance Patient tolerated treatment well    Behavior During Therapy [facility] for tasks assessed/performed           Past Medical History:  Diagnosis Date  . ACL tear 11/2014   left  . Asthma 03/27/2012  . Headache(784.0)    stress/migraine  . History of cardiac murmur    "innocent murmur" 2011 - states has resolved  . History of strabismus   . Hypothyroidism    has not taken medication since 09/2014  . Neurofibromatosis, type 1 (von Recklinghausen's disease) (HCC)   . Seasonal asthma    prn inhaler    Past Surgical History:  Procedure Laterality Date  . ANTERIOR CRUCIATE LIGAMENT REPAIR Left scheduled April 2016   Hamstring graft   . STRABISMUS SURGERY     age 34  . STRABISMUS SURGERY Right 03/01/2014   Procedure: REPAIR STRABISMUS BILATERAL;  Surgeon: Shara Blazing, MD;  Location: [facility];  Service: Ophthalmology;  Laterality: Right;  . TUMOR EXCISION Right as an infant   lower eyelid    There were no vitals filed for this visit.   Subjective Assessment - 04/18/20 0748    Subjective "I think I may have over done it with running and working out with my trainer so my ankle is more sore today. I got my theracane and it has been helping"    Diagnostic tests [place] 6/8 Impression: Ankle sprain and possible peroneal tendinitis, X-ray IMPRESSION:Lateral soft tissue swelling. No acute osseous findings.for the ankle , the shoulder [place] 3/16  Impression: Subacromial bursitis possible partial infraspinatus tear.  No complete significant rotator cuff tear.    Currently in Pain? Yes    Pain Score 2     Pain Location Shoulder    Pain Orientation Right    Pain Descriptors / Indicators Sore    Pain Type Chronic pain    Pain Onset More than a month ago    Pain Frequency Intermittent    Aggravating Factors  pushing heavy items    Pain Relieving Factors moving the shoulder    Pain Score 5    Pain Location Ankle    Pain Orientation Right;Left    Pain Descriptors / Indicators Aching    Pain Type Chronic pain    Pain Onset More than a month ago    Pain Frequency Intermittent    Aggravating Factors  running/ over doing it. rolling the ankle    Pain Relieving Factors stretches,              [facility] PT Assessment - 04/18/20 0001      Assessment   Medical Diagnosis Sprain of left ankle, unspecified ligament, initial encounter , Acute pain of right shoulder, scapular dyskinesia    Referring Provider (PT) Clementeen Graham MD  [facility] Adult PT Treatment/Exercise - 04/18/20 0001      Knee/Hip Exercises: Standing   Hip Abduction 2 sets;15 reps;Knee straight;Stengthening   with green theraband   Abduction Limitations to promote distal control    Other Standing Knee Exercises lateral band walks 2  x 20 ft   with green theraband     Manual Therapy   Manual Therapy Other (comment)    Other Manual Therapy arch support taping      Ankle Exercises: Aerobic   Elliptical L5 x 6 min ramp L5      Ankle Exercises: Stretches   Slant Board Stretch 4 reps;30 seconds   2 x gastroc, 2 x soleus     Ankle Exercises: Standing   Rocker Board --   2 x 20 DF/PF, 2 x 20 inv/eversion with iftter   Heel Walk (Round Trip) 2 x 30 ft    Toe Walk (Round Trip) 2 x 30 ft    Other Standing Ankle Exercises --               Balance Exercises - 04/18/20 0001      Balance Exercises: Standing   SLS Eyes  open;Foam/compliant surface;2 reps;30 secs   airex   SLS with Vectors Solid surface;1 rep   doing ABC while holding red physioball            PT Education - 04/18/20 0839    Education Details updated HEP today for hip abductor strengthening. benefits of arch support and trialing arch taping.    Person(s) Educated Patient    Methods Explanation;Verbal cues    Comprehension Verbalized understanding;Verbal cues required            PT Short Term Goals - 03/21/20 1008      PT SHORT TERM GOAL #1   Title pt to be I with inital HEP    Time 3    Period Weeks    Status New    Target Date 04/11/20      PT SHORT TERM GOAL #2   Title pt will demonstrate full shoulder ROM with no pain    Time 3    Period Weeks    Status New    Target Date 04/11/20      PT SHORT TERM GOAL #3   Title patient will decrease bilateral ankle edema by >/= .8 cm to promote ankle ROM    Time 3    Period Weeks    Status New    Target Date 04/11/20             PT Long Term Goals - 03/21/20 1142      PT LONG TERM GOAL #1   Title patient will demonstrate >/= 30 sec SLS with minimal postural sway for improvement in functional ankle stability    Time 6    Period Weeks    Status New    Target Date 05/02/20      PT LONG TERM GOAL #2   Title Patient will improve shoulder strength to >/= 4+/5 bilaterally to improve scapular motion and decrease pain.    Time 6    Period Weeks    Status New    Target Date 05/02/20      PT LONG TERM GOAL #3   Title patient will get back to running and strength training with no report of instability or pain, per patient's personal goal    Time 6    Period Weeks    Status New  Target Date 05/02/20      PT LONG TERM GOAL #4   Title patient will improve FOTO score to </= 7% limitation to demonstrate improvement in function    Time 6    Period Weeks    Status New    Target Date 05/02/20      PT LONG TERM GOAL #5   Title patient will be independent will all HEP  given to maintain and progress current level of function    Time 6    Period Weeks    Status New    Target Date 05/02/20                 Plan - 04/18/20 0839    Clinical Impression Statement pt reports the shoulder is about 2/10 today and the ankles are 5/10. focused session on ankle today due to shoulders improving. Trialed arch support taping today which she noted significant relief and support. contiued working ankle and hip strengthening to promote proximal and distal control. She reported decreased pain end or session.    PT Treatment/Interventions ADLs/Self Care Home Management;Cryotherapy;Electrical Stimulation;Iontophoresis 4mg /ml Dexamethasone;Moist Heat;Ultrasound;Therapeutic activities;Therapeutic exercise;Balance training;Stair training;Gait training;Patient/family education;Neuromuscular re-education;Manual techniques;Passive range of motion;Dry needling;Taping    PT Next Visit Plan review / update HEP PRN, gross ankle strengthening, ankle motor control and balance, Shoulder STW along the upper trap. scapular upward assist mobs,scapular stability exercise    PT Home Exercise Plan [phone] - upper trap stretch, levator scapulae stretch, ceiling punches, shoulder IR/ER, ankle 4- way strength, heel slide (seated); SLS bilat, push up plus, rows, lower trap y's; marching with band around feet, standing hip abduction, lateral band walks    Consulted and Agree with Plan of Care Patient           Patient will benefit from skilled therapeutic intervention in order to improve the following deficits and impairments:  Improper body mechanics, Increased muscle spasms, Decreased strength, Postural dysfunction, Abnormal gait, Pain, Decreased activity tolerance, Decreased balance, Decreased endurance, Decreased range of motion  Visit Diagnosis: Chronic right shoulder pain  Muscle weakness (generalized)  Pain in left ankle and joints of left foot  Unsteadiness on feet     Problem  List Patient Active Problem List   Diagnosis Date Noted  . Chronic or recurrent subluxation of peroneal tendon of right foot 04/16/2019  . PCP NOTES >>>>>>>>>>>>>>>>>>>>>>>>>>>>>> 07/29/2016  . GERD (gastroesophageal reflux disease) 11/07/2013  . Neurofibromatosis, type 1 (von Recklinghausen's disease) (HCC)   . Galactorrhea on left side 02/13/2013  . Hypothyroidism 05/05/2012  . Dysmenorrhea 04/04/2012  . Weight gain 04/04/2012  . Asthma 03/27/2012  . Allergic rhinitis 03/27/2012  . Annual physical exam 03/27/2012   05/28/2012 PT, DPT, LAT, ATC  04/18/20  8:44 AM      [facility] [address] [place], [place], [place] Phone: [phone]   Fax:  [phone]  Name: Loretta [name] MRN: Larwance Sachs Date of Birth: April 01, 1991

## 2020-04-21 ENCOUNTER — Ambulatory Visit: Payer: BC Managed Care – PPO | Admitting: Physical Therapy

## 2020-04-21 ENCOUNTER — Encounter: Payer: Self-pay | Admitting: Physical Therapy

## 2020-04-21 ENCOUNTER — Other Ambulatory Visit: Payer: Self-pay

## 2020-04-21 DIAGNOSIS — R2681 Unsteadiness on feet: Secondary | ICD-10-CM

## 2020-04-21 DIAGNOSIS — M25572 Pain in left ankle and joints of left foot: Secondary | ICD-10-CM

## 2020-04-21 DIAGNOSIS — M6281 Muscle weakness (generalized): Secondary | ICD-10-CM

## 2020-04-21 DIAGNOSIS — M25511 Pain in right shoulder: Secondary | ICD-10-CM | POA: Diagnosis not present

## 2020-04-21 DIAGNOSIS — G8929 Other chronic pain: Secondary | ICD-10-CM

## 2020-04-21 NOTE — Therapy (Addendum)
[facility] Outpatient [facility] [address] [place], [place], [address] Phone: [phone]   Fax:  [phone]  Physical Therapy Treatment  Patient Details  Name: Loretta [name] MRN: [id number] Date of Birth: 08/17/1991 Referring Provider (PT): Clementeen Graham MD   Encounter Date: 04/21/2020   PT End of Session - 04/21/20 0854    Visit Number 6    Number of Visits 13    Date for PT Re-Evaluation 05/02/20    PT Start Time 0802    PT Stop Time 0844    PT Time Calculation (min) 42 min    Activity Tolerance Patient tolerated treatment well    Behavior During Therapy [facility] for tasks assessed/performed           Past Medical History:  Diagnosis Date   ACL tear 11/2014   left   Asthma 03/27/2012   Headache(784.0)    stress/migraine   History of cardiac murmur    "innocent murmur" 2011 - states has resolved   History of strabismus    Hypothyroidism    has not taken medication since 09/2014   Neurofibromatosis, type 1 (von Recklinghausen's disease) (HCC)    Seasonal asthma    prn inhaler    Past Surgical History:  Procedure Laterality Date   ANTERIOR CRUCIATE LIGAMENT REPAIR Left scheduled April 2016   Hamstring graft    STRABISMUS SURGERY     age 68   STRABISMUS SURGERY Right 03/01/2014   Procedure: REPAIR STRABISMUS BILATERAL;  Surgeon: Shara Blazing, MD;  Location: [facility];  Service: Ophthalmology;  Laterality: Right;   TUMOR EXCISION Right as an infant   lower eyelid    There were no vitals filed for this visit.   Subjective Assessment - 04/21/20 0807    Subjective Pt states right ankle is achey. She went swimming on Saturday and jumped of the diving board, she also worked yesterday. Left ankle is still sore as well. R shoulder continues to be sore, but not as bad as ankles. She was able to get insoles over the weekend.    Limitations Lifting    How long can you sit comfortably? unlimited    How long can you  stand comfortably? unlimited    How long can you walk comfortably? unlmited    Diagnostic tests [place] 6/8 Impression: Ankle sprain and possible peroneal tendinitis, X-ray IMPRESSION:Lateral soft tissue swelling. No acute osseous findings.for the ankle , the shoulder [place] 3/16 Impression: Subacromial bursitis possible partial infraspinatus tear.  No complete significant rotator cuff tear.    Patient Stated Goals to decrease shoulder pain, stabilize ankles    Currently in Pain? Yes    Pain Score 2     Pain Location Shoulder    Pain Orientation Right    Pain Descriptors / Indicators Sore    Pain Type Chronic pain    Pain Onset More than a month ago    Pain Frequency Intermittent    Aggravating Factors  pushing heavy items    Pain Relieving Factors moving the shoulder    Effect of Pain on Daily Activities limited lifting    Pain Score 5    Pain Location Ankle    Pain Orientation Right;Left    Pain Descriptors / Indicators Aching    Pain Type Chronic pain    Pain Onset More than a month ago    Pain Frequency Intermittent    Aggravating Factors  runing/over doing it. rolling the ankle    Pain Relieving  Factors stretches              [facility] PT Assessment - 04/21/20 0001      Assessment   Medical Diagnosis Sprain of left ankle, unspecified ligament, initial encounter , Acute pain of right shoulder, scapular dyskinesia    Referring Provider (PT) Clementeen Graham MD                         [facility] Adult PT Treatment/Exercise - 04/21/20 0001      Shoulder Exercises: Standing   Internal Rotation Strengthening;Right;10 reps   x 2 sets; green band   Other Standing Exercises Push up plus 2x15 against wall    Other Standing Exercises lower trap wall y's 2x15      Manual Therapy   Joint Mobilization L posterior talar glides Grade II      Ankle Exercises: Aerobic   Nustep L4x54min UE/LE      Ankle Exercises: Stretches   Slant Board Stretch 4 reps;30 seconds   2 x gastroc, 2 x soleus       Ankle Exercises: Seated   BAPS --      Ankle Exercises: Standing   SLS 1x15 sec each; 2x30 sec R&L on air-ex    Rocker Board --   1x20 inv/eversion; 1x20 PF/DV; 2x30 sec balancing rockerboar                 PT Education - 04/21/20 0853    Education Details reviewed HEP; ankle A&P    Person(s) Educated Patient    Methods Explanation;Demonstration    Comprehension Verbalized understanding;Verbal cues required            PT Short Term Goals - 04/21/20 0901      PT SHORT TERM GOAL #1   Title pt to be I with inital HEP    Time 3    Period Weeks    Status Achieved    Target Date 04/11/20      PT SHORT TERM GOAL #2   Title pt will demonstrate full shoulder ROM with no pain    Time 3    Period Weeks    Status On-going      PT SHORT TERM GOAL #3   Title patient will decrease bilateral ankle edema by >/= .8 cm to promote ankle ROM    Time 3    Period Weeks    Status On-going    Target Date 04/11/20             PT Long Term Goals - 04/21/20 0902      PT LONG TERM GOAL #1   Title patient will demonstrate >/= 30 sec SLS with minimal postural sway for improvement in functional ankle stability    Time 6    Period Weeks    Status On-going    Target Date 05/02/20      PT LONG TERM GOAL #2   Title Patient will improve shoulder strength to >/= 4+/5 bilaterally to improve scapular motion and decrease pain.    Time 6    Period Weeks    Status On-going    Target Date 05/02/20      PT LONG TERM GOAL #3   Title patient will get back to running and strength training with no report of instability or pain, per patient's personal goal    Time 6    Period Weeks    Status On-going    Target Date 05/02/20  PT LONG TERM GOAL #4   Title patient will improve FOTO score to </= 7% limitation to demonstrate improvement in function    Time 6    Period Weeks    Status On-going    Target Date 05/02/20      PT LONG TERM GOAL #5   Title patient will be independent  will all HEP given to maintain and progress current level of function    Time 6    Period Weeks    Status On-going    Target Date 05/02/20                 Plan - 04/21/20 0855    Clinical Impression Statement Pt continues to demonstrate ankle instability bilaterally. Therapy worked on ankle motor control for inversion/eversion and PF/DV on the rockerboard. SLS was performed on the air-ex to further challenge ankle stability. Pt has increased sway and needs hand support to hold SLS on the air-ex >20 seconds. Therapy continued scapular strenghtening and stabilization exercises to decrease pain and improve GH motion. She reported feeling good at the end of the session.    Personal Factors and Comorbidities Comorbidity 1    Comorbidities hx of asthma, and frequent hx of bil ankle sprains    Examination-Activity Limitations Lift    Stability/Clinical Decision Making Evolving/Moderate complexity    Clinical Decision Making Moderate    Rehab Potential Good    PT Frequency 2x / week    PT Duration 6 weeks    PT Treatment/Interventions ADLs/Self Care Home Management;Cryotherapy;Electrical Stimulation;Iontophoresis 4mg /ml Dexamethasone;Moist Heat;Ultrasound;Therapeutic activities;Therapeutic exercise;Balance training;Stair training;Gait training;Patient/family education;Neuromuscular re-education;Manual techniques;Passive range of motion;Dry needling;Taping    PT Next Visit Plan review / update HEP PRN, gross ankle strengthening, ankle motor control and balance, Shoulder STW along the upper trap. scapular upward assist mobs,scapular stability exercise; Dry needling    PT Home Exercise Plan - upper trap stretch, levator scapulae stretch, ceiling punches, shoulder IR/ER, ankle 4- way strength, heel slide (seated); SLS bilat, push up plus, rows, lower trap y's; marching with band around feet, standing hip abduction, lateral band walks    Consulted and Agree with Plan of Care Patient            Patient will benefit from skilled therapeutic intervention in order to improve the following deficits and impairments:  Improper body mechanics, Increased muscle spasms, Decreased strength, Postural dysfunction, Abnormal gait, Pain, Decreased activity tolerance, Decreased balance, Decreased endurance, Decreased range of motion  Visit Diagnosis: Pain in left ankle and joints of left foot  Chronic right shoulder pain  Muscle weakness (generalized)  Unsteadiness on feet     Problem List Patient Active Problem List   Diagnosis Date Noted   Chronic or recurrent subluxation of peroneal tendon of right foot 04/16/2019   PCP NOTES >>>>>>>>>>>>>>>>>>>>>>>>>>>>>> 07/29/2016   GERD (gastroesophageal reflux disease) 11/07/2013   Neurofibromatosis, type 1 (von Recklinghausen's disease) (HCC)    Galactorrhea on left side 02/13/2013   Hypothyroidism 05/05/2012   Dysmenorrhea 04/04/2012   Weight gain 04/04/2012   Asthma 03/27/2012   Allergic rhinitis 03/27/2012   Annual physical exam 03/27/2012    05/28/2012  SPT 04/21/2020, 9:06 AM   04/23/2020 PT DPT  04/21/2020  During this treatment session, the therapist was present, participating in and directing the treatment.     [facility] Outpatient Rehabilitation [facility] [address] [place], [place], [place] Phone: [phone]   Fax:  [phone]  Name: Loretta [name] MRN: Larwance Sachs Date of  Birth: Feb 10, 1991

## 2020-04-22 ENCOUNTER — Encounter: Payer: Self-pay | Admitting: Internal Medicine

## 2020-04-22 ENCOUNTER — Telehealth: Payer: Self-pay | Admitting: Internal Medicine

## 2020-04-22 DIAGNOSIS — E039 Hypothyroidism, unspecified: Secondary | ICD-10-CM

## 2020-04-22 NOTE — Telephone Encounter (Signed)
For hypothyroidism

## 2020-04-22 NOTE — Telephone Encounter (Signed)
Caller:Anetra Call back # [phone]  Patient would like a referral to endocrinology

## 2020-04-22 NOTE — Telephone Encounter (Signed)
Referral placed to Dr. Lonzo Cloud.

## 2020-04-22 NOTE — Telephone Encounter (Signed)
Did she say what she would like an endo referral for?

## 2020-04-23 ENCOUNTER — Ambulatory Visit: Payer: BC Managed Care – PPO | Admitting: Internal Medicine

## 2020-04-25 ENCOUNTER — Other Ambulatory Visit: Payer: Self-pay

## 2020-04-25 ENCOUNTER — Ambulatory Visit: Payer: BC Managed Care – PPO | Admitting: Physical Therapy

## 2020-04-25 ENCOUNTER — Encounter: Payer: Self-pay | Admitting: Physical Therapy

## 2020-04-25 DIAGNOSIS — M25572 Pain in left ankle and joints of left foot: Secondary | ICD-10-CM

## 2020-04-25 DIAGNOSIS — R2681 Unsteadiness on feet: Secondary | ICD-10-CM

## 2020-04-25 DIAGNOSIS — M25511 Pain in right shoulder: Secondary | ICD-10-CM

## 2020-04-25 DIAGNOSIS — M6281 Muscle weakness (generalized): Secondary | ICD-10-CM

## 2020-04-25 NOTE — Therapy (Signed)
[facility] [address] [place], [place], [address] Phone: [phone]   Fax:  [phone]  Physical Therapy Treatment  Patient Details  Name: Loretta [name] MRN: [id number] Date of Birth: Nov 24, 1990 Referring Provider (PT): Clementeen Graham MD   Encounter Date: 04/25/2020   PT End of Session - 04/25/20 0737    Visit Number 7    Number of Visits 13    Date for PT Re-Evaluation 05/02/20    PT Start Time 0742    PT Stop Time 0827    PT Time Calculation (min) 45 min    Activity Tolerance Patient tolerated treatment well    Behavior During Therapy [facility] for tasks assessed/performed           Past Medical History:  Diagnosis Date  . ACL tear 11/2014   left  . Asthma 03/27/2012  . Headache(784.0)    stress/migraine  . History of cardiac murmur    "innocent murmur" 2011 - states has resolved  . History of strabismus   . Hypothyroidism    has not taken medication since 09/2014  . Neurofibromatosis, type 1 (von Recklinghausen's disease) (HCC)   . Seasonal asthma    prn inhaler    Past Surgical History:  Procedure Laterality Date  . ANTERIOR CRUCIATE LIGAMENT REPAIR Left scheduled April 2016   Hamstring graft   . STRABISMUS SURGERY     age 28  . STRABISMUS SURGERY Right 03/01/2014   Procedure: REPAIR STRABISMUS BILATERAL;  Surgeon: Shara Blazing, MD;  Location: [facility];  Service: Ophthalmology;  Laterality: Right;  . TUMOR EXCISION Right as an infant   lower eyelid    There were no vitals filed for this visit.   Subjective Assessment - 04/25/20 0741    Subjective "I did get some inserts in the my shoes and it really seems to be helping, and she reports no ankle rolling. I am not having as much pain in ankle, but shoulder is still feeling sore today.    Diagnostic tests [place] 6/8 Impression: Ankle sprain and possible peroneal tendinitis, X-ray IMPRESSION:Lateral soft tissue swelling. No acute osseous findings.for  the ankle , the shoulder [place] 3/16 Impression: Subacromial bursitis possible partial infraspinatus tear.  No complete significant rotator cuff tear.    Patient Stated Goals to decrease shoulder pain, stabilize ankles    Currently in Pain? Yes    Pain Score 3     Pain Location Shoulder    Pain Orientation Right    Pain Descriptors / Indicators Aching;Sore    Pain Onset More than a month ago    Pain Frequency Intermittent    Pain Location Ankle    Pain Orientation Right;Left    Pain Type Chronic pain    Pain Onset More than a month ago    Pain Frequency Intermittent              [facility] PT Assessment - 04/25/20 0001      Assessment   Medical Diagnosis Sprain of left ankle, unspecified ligament, initial encounter , Acute pain of right shoulder, scapular dyskinesia    Referring Provider (PT) Clementeen Graham MD      Observation/Other Assessments   Focus on Therapeutic Outcomes (FOTO)  28% limited                         [facility] Adult PT Treatment/Exercise - 04/25/20 0001      Shoulder Exercises: Standing   Protraction  Strengthening;Both   pushing into the soft foam roll with elbows   Protraction Limitations combined with controlled shoulder flexion/extension  2 x 10    Other Standing Exercises lower trap strengtheing 2 x 10 with red abnd   with elbows     Shoulder Exercises: ROM/Strengthening   UBE (Upper Arm Bike) L1 x 4 min    fwd/bwd x 2 min ea.     Shoulder Exercises: Stretch   Other Shoulder Stretches upper trap/ levator scapulae stretching 2 x 30 sec ea only on R side      Manual Therapy   Manual therapy comments skilled palpation and monitoring of pt throughout TPDN    Joint Mobilization T1-T8 PA grade III, and L first rib inferior mobs grade III    Soft tissue mobilization IASTM along R upper trap/ levator scapuale      Ankle Exercises: Standing   Heel Raises 15 reps   on airex pad, cues to avoid letting foot roll out                  PT Education -  04/25/20 0738    Education Details Muscle anatomy and referral patterns, What TPDN is and benefits. FOTO reassessment. to work on stretchg the shoulder while she is sitting in the car on her trip.    Person(s) Educated Patient    Methods Explanation;Verbal cues    Comprehension Verbalized understanding;Verbal cues required            PT Short Term Goals - 04/21/20 0901      PT SHORT TERM GOAL #1   Title pt to be I with inital HEP    Time 3    Period Weeks    Status Achieved    Target Date 04/11/20      PT SHORT TERM GOAL #2   Title pt will demonstrate full shoulder ROM with no pain    Time 3    Period Weeks    Status On-going      PT SHORT TERM GOAL #3   Title patient will decrease bilateral ankle edema by >/= .8 cm to promote ankle ROM    Time 3    Period Weeks    Status On-going    Target Date 04/11/20             PT Long Term Goals - 04/21/20 0902      PT LONG TERM GOAL #1   Title patient will demonstrate >/= 30 sec SLS with minimal postural sway for improvement in functional ankle stability    Time 6    Period Weeks    Status On-going    Target Date 05/02/20      PT LONG TERM GOAL #2   Title Patient will improve shoulder strength to >/= 4+/5 bilaterally to improve scapular motion and decrease pain.    Time 6    Period Weeks    Status On-going    Target Date 05/02/20      PT LONG TERM GOAL #3   Title patient will get back to running and strength training with no report of instability or pain, per patient's personal goal    Time 6    Period Weeks    Status On-going    Target Date 05/02/20      PT LONG TERM GOAL #4   Title patient will improve FOTO score to </= 7% limitation to demonstrate improvement in function    Time 6    Period  Weeks    Status On-going    Target Date 05/02/20      PT LONG TERM GOAL #5   Title patient will be independent will all HEP given to maintain and progress current level of function    Time 6    Period Weeks    Status  On-going    Target Date 05/02/20                 Plan - 04/25/20 0827    Clinical Impression Statement pt reported getting new inserts which have helped her ankle stability reporting no rolling since previous session. educated and consent was provided for TPDN focusing along the R upper trap/ levator scapulae followed with IASTM Techqnieus and thoracic/ rib mobs. continued scapular stability strengthening which she fatigues quickly with but tolerated well, and calf strengthing focusing on pushing through great toe to reduce rolling out.    PT Next Visit Plan review / update HEP PRN, gross ankle strengthening, ankle motor control and balance, Shoulder STW along the upper trap. scapular upward assist mobs,scapular stability exercise; response to DN? heel rasie/ marching on airex pad    PT Home Exercise Plan [phone] - upper trap stretch, levator scapulae stretch, ceiling punches, shoulder IR/ER, ankle 4- way strength, heel slide (seated); SLS bilat, push up plus, rows, lower trap y's; marching with band around feet, standing hip abduction, lateral band walks    Consulted and Agree with Plan of Care Patient           Patient will benefit from skilled therapeutic intervention in order to improve the following deficits and impairments:     Visit Diagnosis: Chronic right shoulder pain  Muscle weakness (generalized)  Pain in left ankle and joints of left foot  Unsteadiness on feet     Problem List Patient Active Problem List   Diagnosis Date Noted  . Chronic or recurrent subluxation of peroneal tendon of right foot 04/16/2019  . PCP NOTES >>>>>>>>>>>>>>>>>>>>>>>>>>>>>> 07/29/2016  . GERD (gastroesophageal reflux disease) 11/07/2013  . Neurofibromatosis, type 1 (von Recklinghausen's disease) (HCC)   . Galactorrhea on left side 02/13/2013  . Hypothyroidism 05/05/2012  . Dysmenorrhea 04/04/2012  . Weight gain 04/04/2012  . Asthma 03/27/2012  . Allergic rhinitis 03/27/2012  .  Annual physical exam 03/27/2012    Lulu Riding PT, DPT, LAT, ATC  04/25/20  8:30 AM      [facility] [address] [place], [place], [address] Phone: [phone]   Fax:  [phone]  Name: Loretta [name] MRN: [id number] Date of Birth: [birth year]

## 2020-04-27 ENCOUNTER — Other Ambulatory Visit: Payer: Self-pay | Admitting: Internal Medicine

## 2020-04-27 DIAGNOSIS — E039 Hypothyroidism, unspecified: Secondary | ICD-10-CM

## 2020-04-28 ENCOUNTER — Ambulatory Visit: Payer: BC Managed Care – PPO | Attending: Family Medicine | Admitting: Physical Therapy

## 2020-04-28 ENCOUNTER — Encounter: Payer: Self-pay | Admitting: Physical Therapy

## 2020-04-28 DIAGNOSIS — G8929 Other chronic pain: Secondary | ICD-10-CM | POA: Diagnosis present

## 2020-04-28 DIAGNOSIS — M25572 Pain in left ankle and joints of left foot: Secondary | ICD-10-CM | POA: Diagnosis present

## 2020-04-28 DIAGNOSIS — M25511 Pain in right shoulder: Secondary | ICD-10-CM | POA: Insufficient documentation

## 2020-04-28 DIAGNOSIS — R2681 Unsteadiness on feet: Secondary | ICD-10-CM | POA: Insufficient documentation

## 2020-04-28 DIAGNOSIS — M6281 Muscle weakness (generalized): Secondary | ICD-10-CM | POA: Insufficient documentation

## 2020-04-28 MED ORDER — LEVOTHYROXINE SODIUM 112 MCG PO TABS: 112.0000 ug | ORAL_TABLET | Freq: Every day | ORAL | 1 refills | Status: DC

## 2020-04-28 NOTE — Therapy (Signed)
[facility] [address] [place], [place], [address] Phone: [phone]   Fax:  [phone]  Physical Therapy Treatment  Patient Details  Name: Loretta [name] MRN: [id number] Date of Birth: 08/10/1991 Referring Provider (PT): Clementeen Graham MD   Encounter Date: 04/28/2020   PT End of Session - 04/28/20 0814    Visit Number 8    Number of Visits 13    Date for PT Re-Evaluation 05/02/20    PT Start Time 0814   pt arrived 14 min late   PT Stop Time 0845    PT Time Calculation (min) 31 min    Activity Tolerance Patient tolerated treatment well    Behavior During Therapy [facility] for tasks assessed/performed           Past Medical History:  Diagnosis Date   ACL tear 11/2014   left   Asthma 03/27/2012   Headache(784.0)    stress/migraine   History of cardiac murmur    "innocent murmur" 2011 - states has resolved   History of strabismus    Hypothyroidism    has not taken medication since 09/2014   Neurofibromatosis, type 1 (von Recklinghausen's disease) (HCC)    Seasonal asthma    prn inhaler    Past Surgical History:  Procedure Laterality Date   ANTERIOR CRUCIATE LIGAMENT REPAIR Left scheduled April 2016   Hamstring graft    STRABISMUS SURGERY     age 55   STRABISMUS SURGERY Right 03/01/2014   Procedure: REPAIR STRABISMUS BILATERAL;  Surgeon: Shara Blazing, MD;  Location: [facility];  Service: Ophthalmology;  Laterality: Right;   TUMOR EXCISION Right as an infant   lower eyelid    There were no vitals filed for this visit.   Subjective Assessment - 04/28/20 0816    Subjective " the DN helped a little, I still feel like I am still tight. I was sore in the ankles since the last session but I think it was from walking in sandals and driving in the car for long periods of time."    Diagnostic tests [place] 6/8 Impression: Ankle sprain and possible peroneal tendinitis, X-ray IMPRESSION:Lateral soft tissue  swelling. No acute osseous findings.for the ankle , the shoulder [place] 3/16 Impression: Subacromial bursitis possible partial infraspinatus tear.  No complete significant rotator cuff tear.    Patient Stated Goals to decrease shoulder pain, stabilize ankles    Currently in Pain? Yes    Pain Score 2     Pain Location Shoulder    Pain Orientation Right    Pain Descriptors / Indicators Aching    Pain Type Chronic pain    Pain Onset More than a month ago    Pain Frequency Intermittent    Aggravating Factors  pushing heavy items    Pain Relieving Factors moving the shoulder around    Pain Score 5    Pain Location Ankle    Pain Orientation Right;Left    Pain Descriptors / Indicators Aching    Pain Type Chronic pain    Pain Onset More than a month ago    Aggravating Factors  walking in flip-flops              [facility] PT Assessment - 04/28/20 0001      Assessment   Medical Diagnosis Sprain of left ankle, unspecified ligament, initial encounter , Acute pain of right shoulder, scapular dyskinesia    Referring Provider (PT) Clementeen Graham MD  [facility] Adult PT Treatment/Exercise - 04/28/20 0001      Shoulder Exercises: Supine   Other Supine Exercises thoracic extensio over pink bolster 2 x 10    with hands behind head and elbows adducted     Shoulder Exercises: ROM/Strengthening   Nustep --      Shoulder Exercises: Stretch   Other Shoulder Stretches upper trap/ levator scapulae stretching 2 x 30 sec ea only on R side      Manual Therapy   Manual therapy comments skilled palpation and monitoring of pt throughout TPDN    Joint Mobilization T1-T8 PA grade III, and L first rib inferior mobs grade III    Soft tissue mobilization IASTM along R upper trap/ levator scapuale      Ankle Exercises: Stretches   Slant Board Stretch 4 reps;30 seconds   2 x soleus 2 x gastroc     Ankle Exercises: Standing   Heel Raises Both;15 reps   on slant board with ball  between ankles for post tib activat           Trigger Point Dry Needling - 04/28/20 0001    Consent Given? Yes    Education Handout Provided Previously provided    Muscles Treated Head and Neck Upper trapezius    Upper Trapezius Response Twitch reponse elicited;Palpable increased muscle length   R               PT Education - 04/28/20 0844    Education Details eivewed running internval doing 1 min run and 1.5 min walk x 3-4 intervals, and to avoid pushing to prevent potential issues.    Person(s) Educated Patient    Methods Explanation;Verbal cues    Comprehension Verbalized understanding;Verbal cues required            PT Short Term Goals - 04/21/20 0901      PT SHORT TERM GOAL #1   Title pt to be I with inital HEP    Time 3    Period Weeks    Status Achieved    Target Date 04/11/20      PT SHORT TERM GOAL #2   Title pt will demonstrate full shoulder ROM with no pain    Time 3    Period Weeks    Status On-going      PT SHORT TERM GOAL #3   Title patient will decrease bilateral ankle edema by >/= .8 cm to promote ankle ROM    Time 3    Period Weeks    Status On-going    Target Date 04/11/20             PT Long Term Goals - 04/21/20 0902      PT LONG TERM GOAL #1   Title patient will demonstrate >/= 30 sec SLS with minimal postural sway for improvement in functional ankle stability    Time 6    Period Weeks    Status On-going    Target Date 05/02/20      PT LONG TERM GOAL #2   Title Patient will improve shoulder strength to >/= 4+/5 bilaterally to improve scapular motion and decrease pain.    Time 6    Period Weeks    Status On-going    Target Date 05/02/20      PT LONG TERM GOAL #3   Title patient will get back to running and strength training with no report of instability or pain, per patient's personal goal    Time  6    Period Weeks    Status On-going    Target Date 05/02/20      PT LONG TERM GOAL #4   Title patient will improve FOTO  score to </= 7% limitation to demonstrate improvement in function    Time 6    Period Weeks    Status On-going    Target Date 05/02/20      PT LONG TERM GOAL #5   Title patient will be independent will all HEP given to maintain and progress current level of function    Time 6    Period Weeks    Status On-going    Target Date 05/02/20                 Plan - 04/28/20 0845    Clinical Impression Statement Limited session due to pt running late. continued TPDN focusin on the R upper trap followed with IASTM techniques and thoracic/ rib mobs. she responded well to thoracic extension over bolster and ankle strengthening with focus on post tib. plan to reassess next session and determine if more PT is needed.    PT Treatment/Interventions ADLs/Self Care Home Management;Cryotherapy;Electrical Stimulation;Iontophoresis 4mg /ml Dexamethasone;Moist Heat;Ultrasound;Therapeutic activities;Therapeutic exercise;Balance training;Stair training;Gait training;Patient/family education;Neuromuscular re-education;Manual techniques;Passive range of motion;Dry needling;Taping    PT Next Visit Plan ERO, review / update HEP PRN, gross ankle strengthening, ankle motor control and balance, Shoulder STW along the upper trap. scapular upward assist mobs,scapular stability exercise; response to DN? heel rasie/ marching on airex pad    PT Home Exercise Plan [phone] - upper trap stretch, levator scapulae stretch, ceiling punches, shoulder IR/ER, ankle 4- way strength, heel slide (seated); SLS bilat, push up plus, rows, lower trap y's; marching with band around feet, standing hip abduction, lateral band walks           Patient will benefit from skilled therapeutic intervention in order to improve the following deficits and impairments:  Improper body mechanics, Increased muscle spasms, Decreased strength, Postural dysfunction, Abnormal gait, Pain, Decreased activity tolerance, Decreased balance, Decreased endurance,  Decreased range of motion  Visit Diagnosis: Chronic right shoulder pain  Muscle weakness (generalized)  Pain in left ankle and joints of left foot  Unsteadiness on feet     Problem List Patient Active Problem List   Diagnosis Date Noted   Chronic or recurrent subluxation of peroneal tendon of right foot 04/16/2019   PCP NOTES >>>>>>>>>>>>>>>>>>>>>>>>>>>>>> 07/29/2016   GERD (gastroesophageal reflux disease) 11/07/2013   Neurofibromatosis, type 1 (von Recklinghausen's disease) (HCC)    Galactorrhea on left side 02/13/2013   Hypothyroidism 05/05/2012   Dysmenorrhea 04/04/2012   Weight gain 04/04/2012   Asthma 03/27/2012   Allergic rhinitis 03/27/2012   Annual physical exam 03/27/2012   05/28/2012 PT, DPT, LAT, ATC  04/28/20  8:47 AM      [facility] [address] [place], [place], [place] Phone: [phone]   Fax:  [phone]  Name: Loretta [name] MRN: Larwance Sachs Date of Birth: 01-10-1991

## 2020-05-02 ENCOUNTER — Encounter: Payer: BC Managed Care – PPO | Admitting: Physical Therapy

## 2020-05-02 ENCOUNTER — Ambulatory Visit: Payer: BC Managed Care – PPO | Admitting: Internal Medicine

## 2020-05-06 ENCOUNTER — Ambulatory Visit: Payer: BC Managed Care – PPO | Admitting: Physical Therapy

## 2020-05-06 ENCOUNTER — Other Ambulatory Visit: Payer: Self-pay

## 2020-05-06 DIAGNOSIS — M25511 Pain in right shoulder: Secondary | ICD-10-CM | POA: Diagnosis not present

## 2020-05-06 DIAGNOSIS — G8929 Other chronic pain: Secondary | ICD-10-CM

## 2020-05-06 DIAGNOSIS — R2681 Unsteadiness on feet: Secondary | ICD-10-CM

## 2020-05-06 DIAGNOSIS — M6281 Muscle weakness (generalized): Secondary | ICD-10-CM

## 2020-05-06 DIAGNOSIS — M25572 Pain in left ankle and joints of left foot: Secondary | ICD-10-CM

## 2020-05-06 NOTE — Therapy (Signed)
[place], [place], [address] Phone: [phone]   Fax:  [phone]  Physical Therapy Treatment/Discharge  Patient Details  Name: Loretta [name] MRN: [id number] Date of Birth: 10-Nov-1990 Referring Provider (PT): Lynne Leader MD   Encounter Date: 05/06/2020   PT End of Session - 05/06/20 0806    Visit Number 9    Number of Visits 13    Date for PT Re-Evaluation 05/02/20    PT Start Time 0806   pt arrived 5 minutes late   PT Stop Time 0845    PT Time Calculation (min) 39 min    Activity Tolerance Patient tolerated treatment well    Behavior During Therapy [facility] for tasks assessed/performed           Past Medical History:  Diagnosis Date   ACL tear 11/2014   left   Asthma 03/27/2012   Headache(784.0)    stress/migraine   History of cardiac murmur    "innocent murmur" 2011 - states has resolved   History of strabismus    Hypothyroidism    has not taken medication since 09/2014   Neurofibromatosis, type 1 (von Recklinghausen's disease) ([place])    Seasonal asthma    prn inhaler    Past Surgical History:  Procedure Laterality Date   ANTERIOR CRUCIATE LIGAMENT REPAIR Left scheduled April 2016   Hamstring graft    STRABISMUS SURGERY     age 29   STRABISMUS SURGERY Right 28/01/2014   Procedure: REPAIR STRABISMUS BILATERAL;  Surgeon: Derry Skill, MD;  Location: [facility];  Service: Ophthalmology;  Laterality: Right;   TUMOR EXCISION Right as an infant   lower eyelid    There were no vitals filed for this visit.   Subjective Assessment - 05/06/20 0809    Subjective "Ankles and shoulder are feeling okay. I ran on Sunday, ran 1 minute walked 2 minutes and then walked 30 minutes afterwards. On the way back I felt my ankle was instable but did not roll it. I have no pain today."    Limitations Lifting    How long can you sit comfortably? unlimited    How long can you stand  comfortably? unlimited    How long can you walk comfortably? unlmited    Diagnostic tests [place] 6/8 Impression: Ankle sprain and possible peroneal tendinitis, X-ray IMPRESSION:Lateral soft tissue swelling. No acute osseous findings.for the ankle , the shoulder [place] 3/16 Impression: Subacromial bursitis possible partial infraspinatus tear.  No complete significant rotator cuff tear.    Patient Stated Goals to decrease shoulder pain, stabilize ankles    Currently in Pain? No/denies    Pain Score 0-No pain    Pain Location Shoulder    Pain Orientation Right    Pain Descriptors / Indicators Aching    Pain Type Chronic pain    Pain Onset More than a month ago    Pain Frequency Intermittent    Aggravating Factors  pushing heavy items    Pain Relieving Factors moving the shoulder around    Effect of Pain on Daily Activities limited lifting    Pain Score 0    Pain Location Ankle    Pain Orientation Left;Right    Pain Descriptors / Indicators Aching    Pain Type Chronic pain    Pain Onset More than a month ago    Pain Frequency Intermittent    Aggravating Factors  walking in flip-flops    Pain Relieving Factors stretches  [facility] PT Assessment - 05/06/20 0001      Assessment   Medical Diagnosis Sprain of left ankle, unspecified ligament, initial encounter , Acute pain of right shoulder, scapular dyskinesia    Referring Provider (PT) Lynne Leader MD      Observation/Other Assessments   Focus on Therapeutic Outcomes (FOTO)  26% limited      Figure 8 Edema   Figure 8 - Right  49    Figure 8 - Left  50      AROM   Left Ankle Dorsiflexion 10    Left Ankle Plantar Flexion 69    Left Ankle Inversion 40    Left Ankle Eversion 6      PROM   Left Ankle Dorsiflexion 16      Strength   Right Shoulder Flexion 4+/5    Right Shoulder Extension 5/5    Right Shoulder ABduction 4+/5    Right Shoulder Internal Rotation 5/5    Right Shoulder External Rotation 5/5    Left Shoulder  Flexion 4+/5    Left Shoulder Extension 5/5    Left Shoulder ABduction 4+/5    Left Shoulder Internal Rotation 4+/5    Left Shoulder External Rotation 4+/5    Right Ankle Dorsiflexion 5/5    Right Ankle Plantar Flexion 5/5    Right Ankle Inversion 5/5    Right Ankle Eversion 5/5    Left Ankle Dorsiflexion 5/5    Left Ankle Plantar Flexion 5/5    Left Ankle Inversion 5/5    Left Ankle Eversion 5/5                         [facility] Adult PT Treatment/Exercise - 05/06/20 0001      Self-Care   Other Self-Care Comments  reassessed ROM, strength, FOTO. Benefits of conintuing exercises at home. how to progress exercises.       Knee/Hip Exercises: Standing   Other Standing Knee Exercises Monster walks x10 each way red band      Shoulder Exercises: Standing   Row Limitations x10   green band   Other Standing Exercises Push up plus x10 against wall    Other Standing Exercises Y's against wall x10 reps      Ankle Exercises: Standing   SLS x30 sec R and L                     PT Short Term Goals - 05/06/20 0826      PT SHORT TERM GOAL #1   Title pt to be I with inital HEP    Time 3    Period Weeks    Status Achieved    Target Date 04/11/20      PT SHORT TERM GOAL #2   Title pt will demonstrate full shoulder ROM with no pain    Time 3    Period Weeks    Status Achieved    Target Date 04/11/20      PT SHORT TERM GOAL #3   Title patient will decrease bilateral ankle edema by >/= .8 cm to promote ankle ROM    Time 3    Period Weeks    Status Partially Met             PT Long Term Goals - 05/06/20 0827      PT LONG TERM GOAL #1   Title patient will demonstrate >/= 30 sec SLS with minimal postural sway for improvement  in functional ankle stability    Time 6    Period Weeks    Status Achieved      PT LONG TERM GOAL #2   Title Patient will improve shoulder strength to >/= 4+/5 bilaterally to improve scapular motion and decrease pain.    Time 6     Period Weeks    Status Achieved      PT LONG TERM GOAL #3   Title patient will get back to running and strength training with no report of instability or pain, per patient's personal goal    Time 6    Period Weeks    Status Partially Met      PT LONG TERM GOAL #4   Title patient will improve FOTO score to </= 7% limitation to demonstrate improvement in function    Time 6    Period Weeks    Status Not Met      PT LONG TERM GOAL #5   Title patient will be independent will all HEP given to maintain and progress current level of function    Time 6    Period Weeks    Status Achieved                 Plan - 05/06/20 0845    Clinical Impression Statement Pt has made significant progress in therapy. Her ankle and shoulder ROM has improved in all directions with reports of no pain. Her ankle and shoulder strength has improved. Pt is able to complete SLS for >/= 30 sec demonstrating improved ankle stability, but continues to have instability with fatigue. Therapy educated patient continuing to work on ankle endurance and increase time running gradually. Pt continues to have upper trap tightness due to anxiety and hiking shoulders.Therapy reviewed HEP and pt feels confident to continue exercises on her own.    Personal Factors and Comorbidities Comorbidity 1    Comorbidities hx of asthma, and frequent hx of bil ankle sprains    Examination-Activity Limitations Lift    Stability/Clinical Decision Making Evolving/Moderate complexity    Clinical Decision Making Moderate    Rehab Potential Good    PT Frequency 2x / week    PT Duration 6 weeks    PT Treatment/Interventions ADLs/Self Care Home Management;Cryotherapy;Electrical Stimulation;Iontophoresis 33m/ml Dexamethasone;Moist Heat;Ultrasound;Therapeutic activities;Therapeutic exercise;Balance training;Stair training;Gait training;Patient/family education;Neuromuscular re-education;Manual techniques;Passive range of motion;Dry needling;Taping     PT Next Visit Plan Discharged    PT Home Exercise Plan [id number]- upper trap stretch, levator scapulae stretch, ceiling punches, shoulder IR/ER, ankle 4- way strength, heel slide (seated); SLS bilat, push up plus, rows, lower trap y's; marching with band around feet, standing hip abduction, lateral band walks    Consulted and Agree with Plan of Care Patient           Patient will benefit from skilled therapeutic intervention in order to improve the following deficits and impairments:  Improper body mechanics, Increased muscle spasms, Decreased strength, Postural dysfunction, Abnormal gait, Pain, Decreased activity tolerance, Decreased balance, Decreased endurance, Decreased range of motion  Visit Diagnosis: Chronic right shoulder pain  Muscle weakness (generalized)  Pain in left ankle and joints of left foot  Unsteadiness on feet     Problem List Patient Active Problem List   Diagnosis Date Noted   Chronic or recurrent subluxation of peroneal tendon of right foot 04/16/2019   PCP NOTES >>>>>>>>>>>>>>>>>>>>>>>>>>>>>> 07/29/2016   GERD (gastroesophageal reflux disease) 11/07/2013   Neurofibrom[place] type 1 [place]  Galactorrhea on left side 02/13/2013   Hypothyroidism 05/05/2012   Dysmenorrhea 04/04/2012   Weight gain 04/04/2012   Asthma 03/27/2012   Allergic rhinitis 03/27/2012   Annual physical exam 03/27/2012    Minna Merritts SPT 05/06/2020, 12:21 PM  [facility] [address] [place], [place], [address] Phone: [phone]   Fax:  [phone]  Name: Kao Berkheimer MRN: [id number] Date of Birth: [birth year]     PHYSICAL THERAPY DISCHARGE SUMMARY  Visits from Start of Care: 9  Current functional level related to goals / functional outcomes: Pt demonstrates full shoulder ROM with no reports of pain and improved shoulder strength bilaterally. Pt has been able to work  with no reports of pain. Due to pt's improved ankle stability and strength she has reported no recent rolled ankles.   Remaining deficits: Pt continues to have tightness in upper trap and some ankle instability with fatigue.    Education / Equipment: Pt was educated on progressing HEP. Discussed returning to running and to gradually increase the time spent running to improve ankle stability with fatigue.   Plan: Patient agrees to discharge.  Patient goals were partially met. Patient is being discharged due to meeting the stated rehab goals.  ?????

## 2020-05-12 ENCOUNTER — Other Ambulatory Visit: Payer: BC Managed Care – PPO

## 2020-05-14 ENCOUNTER — Other Ambulatory Visit: Payer: Self-pay

## 2020-05-14 ENCOUNTER — Encounter: Payer: Self-pay | Admitting: Internal Medicine

## 2020-05-14 ENCOUNTER — Ambulatory Visit (INDEPENDENT_AMBULATORY_CARE_PROVIDER_SITE_OTHER): Payer: BC Managed Care – PPO | Admitting: Internal Medicine

## 2020-05-14 VITALS — BP 130/70 | HR 83 | Ht 61.0 in | Wt 160.2 lb

## 2020-05-14 DIAGNOSIS — E039 Hypothyroidism, unspecified: Secondary | ICD-10-CM | POA: Diagnosis not present

## 2020-05-14 DIAGNOSIS — E010 Iodine-deficiency related diffuse (endemic) goiter: Secondary | ICD-10-CM

## 2020-05-14 LAB — T4, FREE: Free T4: 0.98 ng/dL (ref 0.60–1.60)

## 2020-05-14 LAB — TSH: TSH: 1.84 u[IU]/mL (ref 0.35–4.50)

## 2020-05-14 NOTE — Progress Notes (Signed)
Name: Loretta [name]  MRN/ DOB: [id number], 1990/10/26    Age/ Sex: 29 y.o., female    PCP: Wanda Plump, MD   Reason for Endocrinology Evaluation: Hypothyroidism     Date of Initial Endocrinology Evaluation: 05/14/2020     HPI: [name] is a 29 y.o. female with a past medical history of Hypothyroidism, neurofibromatosis and asthma . The patient presented for initial endocrinology clinic visit on 05/14/2020 for consultative assistance with her hypothyroidism.   She has been diagnosed with hypothyroidism in 2013. She was on LT- replacement at the time.   She takes it appropriately .   Weight has been fluctuating - attributes this to poor diet.  Energy level also fluctuates   Has occasional constipation  Feels right side of thyroid more swollen , this was noted 3 weeks ago. Has been steady since then.    LMP  05/07/2020 - regular   She is not sexually active   No FH of thyroid disease    Works in Set [employer].   HISTORY:  Past Medical History:  Past Medical History:  Diagnosis Date  . ACL tear 11/2014   left  . Asthma 03/27/2012  . Headache(784.0)    stress/migraine  . History of cardiac murmur    "innocent murmur" 2011 - states has resolved  . History of strabismus   . Hypothyroidism    has not taken medication since 09/2014  . Neurofibromatosis, type 1 (von Recklinghausen's disease) ([facility])   . Seasonal asthma    prn inhaler   Past Surgical History:  Past Surgical History:  Procedure Laterality Date  . ANTERIOR CRUCIATE LIGAMENT REPAIR Left scheduled April 2016   Hamstring graft   . STRABISMUS SURGERY     age 56  . STRABISMUS SURGERY Right 03/01/2014   Procedure: REPAIR STRABISMUS BILATERAL;  Surgeon: Shara Blazing, MD;  Location: [facility];  Service: Ophthalmology;  Laterality: Right;  . TUMOR EXCISION Right as an infant   lower eyelid      Social History:  reports that she has never smoked. She has never used smokeless tobacco.  She reports current alcohol use. She reports that she does not use drugs.  Family History: family history includes Diabetes in her maternal grandmother; Stomach cancer (age of onset: 36) in her father.   HOME MEDICATIONS: Allergies as of 05/14/2020   No Known Allergies     Medication List       Accurate as of May 14, 2020  8:07 AM. If you have any questions, ask your nurse or doctor.        albuterol 108 (90 Base) MCG/ACT inhaler Commonly known as: Ventolin HFA Inhale 2 puffs into the lungs every 6 (six) hours as needed for wheezing or shortness of breath.   FISH OIL PO Take by mouth.   Iron 325 (65 Fe) MG Tabs Take 1 tablet (325 mg total) by mouth 2 (two) times daily before a meal.   levothyroxine 112 MCG tablet Commonly known as: SYNTHROID Take 1 tablet (112 mcg total) by mouth daily before breakfast.   VITAMIN C PO Take 1 tablet by mouth daily.         REVIEW OF SYSTEMS: A comprehensive ROS was conducted with the patient and is negative except as per HPI and below:  ROS     OBJECTIVE:  VS: BP 130/70 (BP Location: Left Arm, Patient Position: Sitting, Cuff Size: Normal)   Pulse 83   Ht 5\' 1"  (  1.549 m)   Wt 160 lb 3.2 oz (72.7 kg)   SpO2 98%   BMI 30.27 kg/m    Wt Readings from Last 3 Encounters:  05/14/20 160 lb 3.2 oz (72.7 kg)  03/21/20 156 lb (70.8 kg)  03/04/20 156 lb 9.6 oz (71 kg)     EXAM: General: Pt appears well and is in NAD  Neck: General: Supple without adenopathy. Thyroid: Asymmetrical right thyroid on exam.   Lungs: Clear with good BS bilat with no rales, rhonchi, or wheezes  Heart: Auscultation: RRR.  Abdomen: Normoactive bowel sounds, soft, nontender, without masses or organomegaly palpable  Extremities:  BL LE: No pretibial edema normal ROM and strength.  Skin: Hair: Texture and amount normal with gender appropriate distribution Skin Inspection: No rashes Skin Palpation: Skin temperature, texture, and thickness normal to  palpation  Mental Status: Judgment, insight: Intact Orientation: Oriented to time, place, and person Mood and affect: No depression, anxiety, or agitation     DATA REVIEWED: Results for EMELDA, KOHLBECK (MRN [id number]) as of 05/14/2020 12:41  Ref. Range 03/21/2020 13:33 05/14/2020 08:20  TSH Latest Ref Range: 0.35 - 4.50 uIU/mL 4.89 (H) 1.84  T4,Free(Direct) Latest Ref Range: 0.60 - 1.60 ng/dL  3.53      ASSESSMENT/PLAN/RECOMMENDATIONS:   1. Hypothyroidism:  - Pt is clinically euthyroid  - Has local neck symptoms  - - Pt educated extensively on the correct way to take levothyroxine (first thing in the morning with water, 30 minutes before eating or taking other medications). - Pt encouraged to double dose the following day if she were to miss a dose given long half-life of levothyroxine. - Pre-conception counseling was performed , pt to increase levothyroxine dose by 20% with a positive pregnancy test   Medications : Levothyroxine 112 mcg daily   2. Thyromegaly :   - Pt endorses swelling at the anterior neck - On exam noted she has been noted with slight asymmetry on the right lobe, will proceed with thyroid ultrasound    F/U in 4 months  Labs in 8 weeks    Signed electronically by: Lyndle Herrlich, MD  [facility] Endocrinology  [facility] [address] [place] [place], [place] [address] Phone: [phone] FAX: [phone]   CC: Wanda Plump, MD [address] [place] [address] Phone: [phone] Fax: [phone]   Return to Endocrinology clinic as below: Future Appointments  Date Time Provider Department Center  05/23/2020  7:45 AM [facility]  12/17/2020  9:00 AM [name], MD [facility]

## 2020-05-14 NOTE — Patient Instructions (Signed)

## 2020-05-15 LAB — THYROID PEROXIDASE ANTIBODY: Thyroperoxidase Ab SerPl-aCnc: 5 IU/mL (ref <9)

## 2020-05-22 ENCOUNTER — Encounter: Payer: Self-pay | Admitting: Internal Medicine

## 2020-05-23 ENCOUNTER — Other Ambulatory Visit: Payer: Self-pay | Admitting: Internal Medicine

## 2020-05-23 ENCOUNTER — Other Ambulatory Visit: Payer: BC Managed Care – PPO

## 2020-05-23 ENCOUNTER — Ambulatory Visit (HOSPITAL_BASED_OUTPATIENT_CLINIC_OR_DEPARTMENT_OTHER): Admission: RE | Admit: 2020-05-23 | Payer: BC Managed Care – PPO | Source: Ambulatory Visit

## 2020-05-23 ENCOUNTER — Ambulatory Visit (INDEPENDENT_AMBULATORY_CARE_PROVIDER_SITE_OTHER): Payer: BC Managed Care – PPO

## 2020-05-23 ENCOUNTER — Other Ambulatory Visit: Payer: Self-pay

## 2020-05-23 DIAGNOSIS — E010 Iodine-deficiency related diffuse (endemic) goiter: Secondary | ICD-10-CM | POA: Diagnosis not present

## 2020-05-23 NOTE — Telephone Encounter (Signed)
Will endo be taking over this prescription?

## 2020-09-29 ENCOUNTER — Telehealth (INDEPENDENT_AMBULATORY_CARE_PROVIDER_SITE_OTHER): Payer: BC Managed Care – PPO | Admitting: Internal Medicine

## 2020-09-29 ENCOUNTER — Encounter: Payer: Self-pay | Admitting: Internal Medicine

## 2020-09-29 ENCOUNTER — Other Ambulatory Visit: Payer: Self-pay

## 2020-09-29 VITALS — Temp 98.6°F | Ht 61.0 in

## 2020-09-29 DIAGNOSIS — R059 Cough, unspecified: Secondary | ICD-10-CM | POA: Diagnosis not present

## 2020-09-29 DIAGNOSIS — J4521 Mild intermittent asthma with (acute) exacerbation: Secondary | ICD-10-CM | POA: Diagnosis not present

## 2020-09-29 MED ORDER — AZITHROMYCIN 250 MG PO TABS
ORAL_TABLET | ORAL | 0 refills | Status: DC
Start: 2020-09-29 — End: 2020-12-17

## 2020-09-29 MED ORDER — BUDESONIDE-FORMOTEROL FUMARATE 160-4.5 MCG/ACT IN AERO
2.0000 | INHALATION_SPRAY | Freq: Two times a day (BID) | RESPIRATORY_TRACT | 1 refills | Status: DC
Start: 2020-09-29 — End: 2021-01-20

## 2020-09-29 NOTE — Progress Notes (Unsigned)
Subjective:    Patient ID: Loretta [name], female    DOB: 29-Sep-1990, 29 y.o.   MRN: [id number]  DOS:  09/29/2020 Type of visit - description: Virtual Visit via Telephone    I connected with above mentioned patient  by telephone and verified that I am speaking with the correct person using two identifiers.  THIS ENCOUNTER IS A VIRTUAL VISIT DUE TO COVID-19 - PATIENT WAS NOT SEEN IN THE OFFICE. PATIENT HAS CONSENTED TO VIRTUAL VISIT / TELEMEDICINE VISIT   Location of patient: home  Location of provider: office  Persons participating in the virtual visit: patient, provider   I discussed the limitations, risks, security and privacy concerns of performing an evaluation and management service by telephone and the availability of in person appointments. I also discussed with the patient that there may be a patient responsible charge related to this service. The patient expressed understanding and agreed to proceed.  Acute The patient has a history of asthma, reports cough for several months, worse for 1 week.  It is associated with some hoarseness. She tested negative for Covid December 22 and 4 days ago.  She has increased the use of albuterol to BID with partial cough release.  Denies any fever chills No chest pain or difficulty breathing No wheezing per se Minor sinus congestion. No nausea or vomiting No myalgias. Last menstrual period: Now     Review of Systems See above   Past Medical History:  Diagnosis Date  . ACL tear 11/2014   left  . Asthma 03/27/2012  . Headache(784.0)    stress/migraine  . History of cardiac murmur    "innocent murmur" 2011 - states has resolved  . History of strabismus   . Hypothyroidism    has not taken medication since 09/2014  . Neurofibromatosis, type 1 (von Recklinghausen's disease) ([facility])   . Seasonal asthma    prn inhaler    Past Surgical History:  Procedure Laterality Date  . ANTERIOR CRUCIATE LIGAMENT REPAIR Left scheduled April  2016   Hamstring graft   . STRABISMUS SURGERY     age 18  . STRABISMUS SURGERY Right 03/01/2014   Procedure: REPAIR STRABISMUS BILATERAL;  Surgeon: Shara Blazing, MD;  Location: [facility];  Service: Ophthalmology;  Laterality: Right;  . TUMOR EXCISION Right as an infant   lower eyelid    Allergies as of 09/29/2020   No Known Allergies     Medication List       Accurate as of September 29, 2020  4:13 PM. If you have any questions, ask your nurse or doctor.        albuterol 108 (90 Base) MCG/ACT inhaler Commonly known as: Ventolin HFA Inhale 2 puffs into the lungs every 6 (six) hours as needed for wheezing or shortness of breath.   azithromycin 250 MG tablet Commonly known as: Zithromax Z-Pak 2 tabs a day the first day, then 1 tab a day x 4 days Started by: Willow Ora, MD   budesonide-formoterol 160-4.5 MCG/ACT inhaler Commonly known as: SYMBICORT Inhale 2 puffs into the lungs 2 (two) times daily. Started by: Willow Ora, MD   FISH OIL PO Take by mouth.   Iron 325 (65 Fe) MG Tabs Take 1 tablet (325 mg total) by mouth 2 (two) times daily before a meal.   levothyroxine 112 MCG tablet Commonly known as: SYNTHROID Take 1 tablet (112 mcg total) by mouth daily before breakfast.   VITAMIN C PO Take 1 tablet by  mouth daily.          Objective:   Physical Exam Temp 98.6 F (37 C) (Oral)   Ht 5\' 1"  (1.549 m)   BMI 30.27 kg/m  We were unable to communicate via video, this is a telephone visit, alert oriented x3.  In no apparent distress.  Mild cough noted.    Assessment     ASSESSMENT Hypothyroidism  GERD H/o asthma , prn albuterol, seasonal  Neurofibromatoses  FH: First cousin dies form a brain aneurysm Not on BP as off 09/2020  PLAN Cough, asthma: The patient has a history of asthma, has cough for months and symptoms increased for the last week. she has been check for Covid twice: Negative. Suspect chronically poorly controlled asthma +/- acute  bronchitis, recommend the following: Start Symbicort twice daily, continue albuterol as needed only, start Zithromax. Monitor symptoms, call if not improving.  Message sent with detailed instructions.    I discussed the assessment and treatment plan with the patient. The patient was provided an opportunity to ask questions and all were answered. The patient agreed with the plan and demonstrated an understanding of the instructions.   The patient was advised to call back or seek an in-person evaluation if the symptoms worsen or if the condition fails to improve as anticipated.  I provided 21 minutes of non-face-to-face time during this encounter.  10/2020, MD

## 2020-09-30 NOTE — Assessment & Plan Note (Signed)
Cough, asthma: The patient has a history of asthma, has cough for months and symptoms increased for the last week. she has been check for Covid twice: Negative. Suspect chronically poorly controlled asthma +/- acute bronchitis, recommend the following: Start Symbicort twice daily, continue albuterol as needed only, start Zithromax. Monitor symptoms, call if not improving.  Message sent with detailed instructions.

## 2020-12-03 ENCOUNTER — Other Ambulatory Visit: Payer: Self-pay | Admitting: Internal Medicine

## 2020-12-05 ENCOUNTER — Encounter: Payer: Self-pay | Admitting: Internal Medicine

## 2020-12-17 ENCOUNTER — Other Ambulatory Visit: Payer: Self-pay

## 2020-12-17 ENCOUNTER — Encounter: Payer: Self-pay | Admitting: Obstetrics & Gynecology

## 2020-12-17 ENCOUNTER — Ambulatory Visit (INDEPENDENT_AMBULATORY_CARE_PROVIDER_SITE_OTHER): Payer: BC Managed Care – PPO | Admitting: Obstetrics & Gynecology

## 2020-12-17 VITALS — BP 126/78 | Ht 61.0 in | Wt 164.0 lb

## 2020-12-17 DIAGNOSIS — Z6830 Body mass index (BMI) 30.0-30.9, adult: Secondary | ICD-10-CM | POA: Diagnosis not present

## 2020-12-17 DIAGNOSIS — Z01419 Encounter for gynecological examination (general) (routine) without abnormal findings: Secondary | ICD-10-CM | POA: Diagnosis not present

## 2020-12-17 DIAGNOSIS — E6609 Other obesity due to excess calories: Secondary | ICD-10-CM | POA: Diagnosis not present

## 2020-12-17 NOTE — Progress Notes (Signed)
    Loretta [name] [birth year] [id number]   History:    30 y.o. G0Single/Virgin.Lives with parents. Works in Surveyor, mining therapy.  BD:[id number] presenting for annual gyn exam   LNL:GXQJJH menstrual periods every month with normal flow. No breakthrough bleeding. No pelvic pain. Normal vaginal secretions. Virgin. Urine and bowel movements normal. Breast normal. Body mass index increased to 30.99. Health labs with family physician Dr. Drue Novel.   Past medical history,surgical history, family history and social history were all reviewed and documented in the EPIC chart.  Gynecologic History Patient's last menstrual period was 11/26/2020.   Obstetric History OB History  Gravida Para Term Preterm AB Living  0            SAB IAB Ectopic Multiple Live Births                ROS: A ROS was performed and pertinent positives and negatives are included in the history.  GENERAL: No fevers or chills. HEENT: No change in vision, no earache, sore throat or sinus congestion. NECK: No pain or stiffness. CARDIOVASCULAR: No chest pain or pressure. No palpitations. PULMONARY: No shortness of breath, cough or wheeze. GASTROINTESTINAL: No abdominal pain, nausea, vomiting or diarrhea, melena or bright red blood per rectum. GENITOURINARY: No urinary frequency, urgency, hesitancy or dysuria. MUSCULOSKELETAL: No joint or muscle pain, no back pain, no recent trauma. DERMATOLOGIC: No rash, no itching, no lesions. ENDOCRINE: No polyuria, polydipsia, no heat or cold intolerance. No recent change in weight. HEMATOLOGICAL: No anemia or easy bruising or bleeding. NEUROLOGIC: No headache, seizures, numbness, tingling or weakness. PSYCHIATRIC: No depression, no loss of interest in normal activity or change in sleep pattern.     Exam:   BP 126/78   Ht 5\' 1"  (1.549 m)   Wt 164 lb (74.4 kg)   LMP 11/26/2020   BMI 30.99 kg/m   Body mass index is 30.99 kg/m.  General appearance : Well developed  well nourished female. No acute distress HEENT: Eyes: no retinal hemorrhage or exudates,  Neck supple, trachea midline, no carotid bruits, no thyroidmegaly Lungs: Clear to auscultation, no rhonchi or wheezes, or rib retractions  Heart: Regular rate and rhythm, no murmurs or gallops Breast:Examined in sitting and supine position were symmetrical in appearance, no palpable masses or tenderness,  no skin retraction, no nipple inversion, no nipple discharge, no skin discoloration, no axillary or supraclavicular lymphadenopathy Abdomen: no palpable masses or tenderness, no rebound or guarding Extremities: no edema or skin discoloration or tenderness  Pelvic: Vulva: Normal             Vagina: No gross lesions or discharge  Cervix: No gross lesions or discharge  Uterus  AV, normal size, shape and consistency, non-tender and mobile  Adnexa  Without masses or tenderness  Anus: Normal   Assessment/Plan:  30 y.o. female for annual exam   1. Well female exam with routine gynecological exam Normal gynecologic exam.  Patient is a virgin.  No need for contraception.  No indication for Pap test, Pap negative in 2019.  Breast exam normal.  2. Class 1 obesity due to excess calories without serious comorbidity with body mass index (BMI) of 30.0 to 30.9 in adult Recommend a lower calorie/carb diet.  Aerobic activities 5 times a week and light weightlifting every 2 days.  2020 MD, 9:47 AM 12/17/2020

## 2021-01-20 ENCOUNTER — Ambulatory Visit (INDEPENDENT_AMBULATORY_CARE_PROVIDER_SITE_OTHER): Payer: BC Managed Care – PPO | Admitting: Internal Medicine

## 2021-01-20 ENCOUNTER — Other Ambulatory Visit: Payer: Self-pay

## 2021-01-20 ENCOUNTER — Encounter: Payer: Self-pay | Admitting: Internal Medicine

## 2021-01-20 VITALS — BP 114/72 | HR 87 | Temp 97.8°F | Ht 61.0 in | Wt 158.0 lb

## 2021-01-20 DIAGNOSIS — E039 Hypothyroidism, unspecified: Secondary | ICD-10-CM

## 2021-01-20 DIAGNOSIS — Z0000 Encounter for general adult medical examination without abnormal findings: Secondary | ICD-10-CM | POA: Diagnosis not present

## 2021-01-20 DIAGNOSIS — Z1159 Encounter for screening for other viral diseases: Secondary | ICD-10-CM | POA: Diagnosis not present

## 2021-01-20 DIAGNOSIS — D649 Anemia, unspecified: Secondary | ICD-10-CM

## 2021-01-20 DIAGNOSIS — E010 Iodine-deficiency related diffuse (endemic) goiter: Secondary | ICD-10-CM | POA: Diagnosis not present

## 2021-01-20 MED ORDER — ALBUTEROL SULFATE HFA 108 (90 BASE) MCG/ACT IN AERS: 2.0000 | INHALATION_SPRAY | Freq: Four times a day (QID) | RESPIRATORY_TRACT | 5 refills | Status: DC | PRN

## 2021-01-20 MED ORDER — BUDESONIDE-FORMOTEROL FUMARATE 160-4.5 MCG/ACT IN AERO: 2.0000 | INHALATION_SPRAY | Freq: Two times a day (BID) | RESPIRATORY_TRACT | 5 refills | Status: DC

## 2021-01-20 NOTE — Progress Notes (Signed)
Subjective:    Patient ID: Loretta [name], female    DOB: 11-Jun-1991, 30 y.o.   MRN: [id number]  DOS:  01/20/2021 Type of visit - description: CPX  Since the last office visit he is doing okay. She started coughing wheezing when the pollen counts increased, on Symbicort, needs a refill on albuterol.   Review of Systems  Other than above, a 14 point review of systems is negative      Past Medical History:  Diagnosis Date  . ACL tear 11/2014   left  . Asthma 03/27/2012  . Headache(784.0)    stress/migraine  . History of cardiac murmur    "innocent murmur" 2011 - states has resolved  . History of strabismus   . Hypothyroidism    has not taken medication since 09/2014  . Neurofibromatosis, type 1 (von Recklinghausen's disease) (HCC)   . Seasonal asthma    prn inhaler    Past Surgical History:  Procedure Laterality Date  . ANTERIOR CRUCIATE LIGAMENT REPAIR Left scheduled April 2016   Hamstring graft   . STRABISMUS SURGERY     age 65  . STRABISMUS SURGERY Right 03/01/2014   Procedure: REPAIR STRABISMUS BILATERAL;  Surgeon: Shara Blazing, MD;  Location: [facility];  Service: Ophthalmology;  Laterality: Right;  . TUMOR EXCISION Right as an infant   lower eyelid    Allergies as of 01/20/2021   No Known Allergies     Medication List       Accurate as of January 20, 2021 11:59 PM. If you have any questions, ask your nurse or doctor.        STOP taking these medications   FISH OIL PO Stopped by: Willow Ora, MD     TAKE these medications   albuterol 108 (90 Base) MCG/ACT inhaler Commonly known as: Ventolin HFA Inhale 2 puffs into the lungs every 6 (six) hours as needed for wheezing or shortness of breath.   budesonide-formoterol 160-4.5 MCG/ACT inhaler Commonly known as: SYMBICORT Inhale 2 puffs into the lungs 2 (two) times daily.   Iron 325 (65 Fe) MG Tabs Take 1 tablet (325 mg total) by mouth 2 (two) times daily before a meal.   levothyroxine 112  MCG tablet Commonly known as: SYNTHROID TAKE 1 TABLET (112 MCG TOTAL) BY MOUTH DAILY BEFORE BREAKFAST.   VITAMIN C PO Take 1 tablet by mouth daily.          Objective:   Physical Exam BP 114/72 (BP Location: Right Arm, Patient Position: Sitting, Cuff Size: Large)   Pulse 87   Temp 97.8 F (36.6 C) (Temporal)   Ht 5\' 1"  (1.549 m)   Wt 158 lb (71.7 kg)   SpO2 98%   BMI 29.85 kg/m  General: Well developed, NAD, BMI noted Neck: Nontender, right-sided thyroid nodule on exam. HEENT:  Normocephalic . Face symmetric, atraumatic Lungs:  CTA B Normal respiratory effort, no intercostal retractions, no accessory muscle use. Heart: RRR,  no murmur.  Abdomen:  Not distended, soft, non-tender. No rebound or rigidity.   Lower extremities: no pretibial edema bilaterally  Skin: Exposed areas without rash. Not pale. Not jaundice Neurologic:  alert & oriented X3.  Speech normal, gait appropriate for age and unassisted Strength symmetric and appropriate for age.  Psych: Cognition and judgment appear intact.  Cooperative with normal attention span and concentration.  Behavior appropriate. No anxious or depressed appearing.     Assessment      ASSESSMENT Hypothyroidism  Thyroid nodule  GERD H/o asthma , prn albuterol, seasonal  Neurofibromatoses  FH: First cousin dies form a brain aneurysm Not on BP as off 12/2020  PLAN Here for CPX Hypothyroidism: Reports good compliance with Synthroid, checking labs Thyroid nodule: Saw Endo 05/14/2020, thyroid peroxidase antibody negative, [place]: Thyromegaly and one nodule, next [place] 04-2021   Asthma: Symptoms are seasonal, recommend Symbicort twice a day seasonally and albuterol as needed, prescription sent. Anemia: Currently menses are heavy for 2 to 3 days then bleeding is mild, menses are regular.  No GI symptoms.  Not taking iron consistently.  Checking labs. RTC 6 months   This visit occurred during the SARS-CoV-2 public health emergency.   Safety protocols were in place, including screening questions prior to the visit, additional usage of staff PPE, and extensive cleaning of exam room while observing appropriate contact time as indicated for disinfecting solutions.

## 2021-01-20 NOTE — Patient Instructions (Signed)
Symbicort: This is a regular inhaler that you take when the pollen is high or when you have asthma.  Albuterol is your rescue inhaler, you can take it up to every 6 hours only if you are coughing or wheezing.    GO TO THE LAB : Get the blood work     GO TO THE FRONT DESK, PLEASE SCHEDULE YOUR APPOINTMENTS Come back for   a checkup in 6 months    STOP BY THE FIRST FLOOR: Schedule the ultrasound of your thyroid to be done in August 2022

## 2021-01-21 ENCOUNTER — Encounter: Payer: Self-pay | Admitting: Internal Medicine

## 2021-01-21 LAB — CBC WITH DIFFERENTIAL/PLATELET
Basophils Absolute: 0.1 10*3/uL (ref 0.0–0.1)
Basophils Relative: 1.1 % (ref 0.0–3.0)
Eosinophils Absolute: 0.2 10*3/uL (ref 0.0–0.7)
Eosinophils Relative: 2.3 % (ref 0.0–5.0)
HCT: 35.4 % — ABNORMAL LOW (ref 36.0–46.0)
Hemoglobin: 11.7 g/dL — ABNORMAL LOW (ref 12.0–15.0)
Lymphocytes Relative: 21.9 % (ref 12.0–46.0)
Lymphs Abs: 1.5 10*3/uL (ref 0.7–4.0)
MCHC: 33 g/dL (ref 30.0–36.0)
MCV: 81.3 fl (ref 78.0–100.0)
Monocytes Absolute: 0.6 10*3/uL (ref 0.1–1.0)
Monocytes Relative: 8.1 % (ref 3.0–12.0)
Neutro Abs: 4.5 10*3/uL (ref 1.4–7.7)
Neutrophils Relative %: 66.6 % (ref 43.0–77.0)
Platelets: 365 10*3/uL (ref 150.0–400.0)
RBC: 4.36 Mil/uL (ref 3.87–5.11)
RDW: 14.8 % (ref 11.5–15.5)
WBC: 6.8 10*3/uL (ref 4.0–10.5)

## 2021-01-21 LAB — COMPREHENSIVE METABOLIC PANEL
ALT: 8 U/L (ref 0–35)
AST: 11 U/L (ref 0–37)
Albumin: 4.3 g/dL (ref 3.5–5.2)
Alkaline Phosphatase: 37 U/L — ABNORMAL LOW (ref 39–117)
BUN: 14 mg/dL (ref 6–23)
CO2: 24 mEq/L (ref 19–32)
Calcium: 9.8 mg/dL (ref 8.4–10.5)
Chloride: 105 mEq/L (ref 96–112)
Creatinine, Ser: 0.74 mg/dL (ref 0.40–1.20)
GFR: 109.45 mL/min (ref >60.00)
Glucose, Bld: 90 mg/dL (ref 70–99)
Potassium: 4.4 mEq/L (ref 3.5–5.1)
Sodium: 136 mEq/L (ref 135–145)
Total Bilirubin: 0.5 mg/dL (ref 0.2–1.2)
Total Protein: 6.8 g/dL (ref 6.0–8.3)

## 2021-01-21 LAB — IBC + FERRITIN
Ferritin: 5.3 ng/mL — ABNORMAL LOW (ref 10.0–291.0)
Iron: 50 ug/dL (ref 42–145)
Saturation Ratios: 12.7 % — ABNORMAL LOW (ref 20.0–50.0)
Transferrin: 281 mg/dL (ref 212.0–360.0)

## 2021-01-21 LAB — HEPATITIS C ANTIBODY
Hepatitis C Ab: NONREACTIVE
SIGNAL TO CUT-OFF: 0 (ref <1.00)

## 2021-01-21 LAB — LIPID PANEL
Cholesterol: 131 mg/dL (ref 0–200)
HDL: 38.9 mg/dL — ABNORMAL LOW (ref >39.00)
LDL Cholesterol: 76 mg/dL (ref 0–99)
NonHDL: 92.58
Total CHOL/HDL Ratio: 3
Triglycerides: 85 mg/dL (ref 0.0–149.0)
VLDL: 17 mg/dL (ref 0.0–40.0)

## 2021-01-21 LAB — TSH: TSH: 0.28 u[IU]/mL — ABNORMAL LOW (ref 0.35–4.50)

## 2021-01-21 NOTE — Assessment & Plan Note (Signed)
Here for CPX Hypothyroidism: Reports good compliance with Synthroid, checking labs Thyroid nodule: Saw Endo 05/14/2020, thyroid peroxidase antibody negative, [place]: Thyromegaly and one nodule, next [place] 04-2021   Asthma: Symptoms are seasonal, recommend Symbicort twice a day seasonally and albuterol as needed, prescription sent. Anemia: Currently menses are heavy for 2 to 3 days then bleeding is mild, menses are regular.  No GI symptoms.  Not taking iron consistently.  Checking labs. RTC 6 months

## 2021-01-21 NOTE — Assessment & Plan Note (Signed)
Tdap 2020 COVID VAX x2, encouraged to proceed with a booster Female care: saw gyn ~ 4-5 weeks ago Diet and exercise: Discussed Labs: CMP, FLP, anemia profile, CBC, TSH, hep C

## 2021-01-26 NOTE — Addendum Note (Signed)
Addended byConrad [place] D on: 01/26/2021 08:47 AM   Modules accepted: Orders

## 2021-02-27 ENCOUNTER — Other Ambulatory Visit: Payer: Self-pay

## 2021-02-27 ENCOUNTER — Ambulatory Visit (INDEPENDENT_AMBULATORY_CARE_PROVIDER_SITE_OTHER): Payer: BC Managed Care – PPO | Admitting: Obstetrics & Gynecology

## 2021-02-27 ENCOUNTER — Encounter: Payer: Self-pay | Admitting: Obstetrics & Gynecology

## 2021-02-27 VITALS — BP 128/78

## 2021-02-27 DIAGNOSIS — N926 Irregular menstruation, unspecified: Secondary | ICD-10-CM | POA: Diagnosis not present

## 2021-02-27 DIAGNOSIS — E039 Hypothyroidism, unspecified: Secondary | ICD-10-CM | POA: Diagnosis not present

## 2021-02-27 NOTE — Progress Notes (Signed)
    Loretta [name] 1991/07/22 [id number]        30 y.o.  G0 Virgin  RP: Irregular menses  HPI: Menstrual periods usually every month with normal flow x 5 days.  Rare BTB when doing fitness.  Had a mid-cycle BTB episode which lasted a couple of days mid May 2022.  Now 6 days late for her menstrual period.  LMP 01/23/2021.  No pelvic pain.  No abnormal vaginal discharge.  Urine/BMs normal.  No fever.  Hypothyroidism on Synthroid 112 microgram daily.   OB History  Gravida Para Term Preterm AB Living  0            SAB IAB Ectopic Multiple Live Births               Past medical history,surgical history, problem list, medications, allergies, family history and social history were all reviewed and documented in the EPIC chart.   Directed ROS with pertinent positives and negatives documented in the history of present illness/assessment and plan.  Exam:  Vitals:   02/27/21 1148  BP: 128/78   General appearance:  Normal  Abdomen: Normal  Gynecologic exam: Vulva normal.  Bimanual exam with 1 finger vaginally:  Cervix normal.  Vagina normal.  No blood.  Uterus AV, normal volume, no adnexal mass.   Assessment/Plan:  30 y.o. G0P0   1. Irregular menses Irregular menses in the last month.  Patient is a virgin.  Hypothyroidism, will do a thyroid panel today.  Rule out hyperprolactinemia.  If labs are normal, decision to observe at this time.  Patient will call back if persistence of irregular menses to complete the investigation with a pelvic ultrasound. - Thyroid Panel With TSH - Prolactin  2. Hypothyroidism, unspecified type Hypothyroidism on Synthroid.  Thyroid panel done today to monitor treatment.  [name] MD, 12:02 PM 02/27/2021

## 2021-02-28 LAB — THYROID PANEL WITH TSH
Free Thyroxine Index: 2.8 (ref 1.4–3.8)
T3 Uptake: 34 % (ref 22–35)
T4, Total: 8.2 ug/dL (ref 5.1–11.9)
TSH: 1.13 mIU/L

## 2021-02-28 LAB — PROLACTIN: Prolactin: 14.2 ng/mL

## 2021-03-03 ENCOUNTER — Encounter: Payer: Self-pay | Admitting: Obstetrics & Gynecology

## 2021-03-09 ENCOUNTER — Telehealth: Payer: Self-pay

## 2021-03-09 ENCOUNTER — Other Ambulatory Visit: Payer: BC Managed Care – PPO

## 2021-03-09 NOTE — Telephone Encounter (Signed)
[facility] Night - Client Client Site [facility] - Night Physician Willow Ora - MD Contact Type Call Who Is Calling Patient / Member / Family / Caregiver Caller Name Mumtaz Lovins Caller Phone Number [phone] Patient Name Loretta [name] Patient DOB Apr 14, 1991 Call Type Message Only Information Provided Reason for Call Request to Reschedule Office Appointment Initial Comment Caller states that she had an appointment today. She needs to reschedule the appointment. Patient request to speak to RN No Additional Comment She states that it is for lab work. Caller states that she can do an appointment on Friday around 230-3. Declined triage. Provided office hours. Disp. Time Disposition Final User 03/09/2021 7:59:24 AM General Information Provided Yes Tye Maryland Call Closed By: Tye Maryland Transaction Date/Time: 03/09/2021 7:56:45 AM (ET

## 2021-03-12 ENCOUNTER — Other Ambulatory Visit: Payer: Self-pay | Admitting: Internal Medicine

## 2021-03-13 ENCOUNTER — Other Ambulatory Visit (INDEPENDENT_AMBULATORY_CARE_PROVIDER_SITE_OTHER): Payer: BC Managed Care – PPO

## 2021-03-13 ENCOUNTER — Other Ambulatory Visit: Payer: Self-pay

## 2021-03-13 DIAGNOSIS — E039 Hypothyroidism, unspecified: Secondary | ICD-10-CM | POA: Diagnosis not present

## 2021-03-13 LAB — TSH: TSH: 0.85 mIU/L

## 2021-03-13 NOTE — Addendum Note (Signed)
Addended by: Rosita Kea on: 03/13/2021 02:45 PM   Modules accepted: Orders

## 2021-03-17 MED ORDER — LEVOTHYROXINE SODIUM 112 MCG PO TABS: 112.0000 ug | ORAL_TABLET | Freq: Every day | ORAL | 1 refills | Status: DC

## 2021-03-17 NOTE — Addendum Note (Signed)
Addended byConrad [place] D on: 03/17/2021 01:17 PM   Modules accepted: Orders

## 2021-04-01 ENCOUNTER — Ambulatory Visit: Payer: BC Managed Care – PPO | Admitting: Internal Medicine

## 2021-05-07 ENCOUNTER — Telehealth: Payer: Self-pay | Admitting: Internal Medicine

## 2021-05-07 ENCOUNTER — Encounter: Payer: Self-pay | Admitting: Internal Medicine

## 2021-05-07 DIAGNOSIS — D649 Anemia, unspecified: Secondary | ICD-10-CM

## 2021-05-07 NOTE — Telephone Encounter (Signed)
Pt states Dr. Drue Novel informed her to call and schedule lab appt however there is no order in the system.

## 2021-05-08 ENCOUNTER — Other Ambulatory Visit: Payer: Self-pay

## 2021-05-08 ENCOUNTER — Other Ambulatory Visit (INDEPENDENT_AMBULATORY_CARE_PROVIDER_SITE_OTHER): Payer: BC Managed Care – PPO

## 2021-05-08 DIAGNOSIS — D649 Anemia, unspecified: Secondary | ICD-10-CM | POA: Diagnosis not present

## 2021-05-08 LAB — CBC WITH DIFFERENTIAL/PLATELET
Basophils Absolute: 0.1 10*3/uL (ref 0.0–0.1)
Basophils Relative: 1.3 % (ref 0.0–3.0)
Eosinophils Absolute: 0.1 10*3/uL (ref 0.0–0.7)
Eosinophils Relative: 1.5 % (ref 0.0–5.0)
HCT: 36.5 % (ref 36.0–46.0)
Hemoglobin: 11.7 g/dL — ABNORMAL LOW (ref 12.0–15.0)
Lymphocytes Relative: 26.5 % (ref 12.0–46.0)
Lymphs Abs: 1.7 10*3/uL (ref 0.7–4.0)
MCHC: 32.2 g/dL (ref 30.0–36.0)
MCV: 81.5 fl (ref 78.0–100.0)
Monocytes Absolute: 0.5 10*3/uL (ref 0.1–1.0)
Monocytes Relative: 8.1 % (ref 3.0–12.0)
Neutro Abs: 3.9 10*3/uL (ref 1.4–7.7)
Neutrophils Relative %: 62.6 % (ref 43.0–77.0)
Platelets: 358 10*3/uL (ref 150.0–400.0)
RBC: 4.48 Mil/uL (ref 3.87–5.11)
RDW: 15.2 % (ref 11.5–15.5)
WBC: 6.3 10*3/uL (ref 4.0–10.5)

## 2021-05-08 LAB — IBC + FERRITIN
Ferritin: 9 ng/mL — ABNORMAL LOW (ref 10.0–291.0)
Iron: 99 ug/dL (ref 42–145)
Saturation Ratios: 22.7 % (ref 20.0–50.0)
TIBC: 436.8 ug/dL (ref 250.0–450.0)
Transferrin: 312 mg/dL (ref 212.0–360.0)

## 2021-05-08 NOTE — Telephone Encounter (Signed)
I left early yesterday afternoon, orders have been placed this morning. Please call her to schedule lab appt at her convenience. TY.

## 2021-05-11 MED ORDER — IRON 325 (65 FE) MG PO TABS: 1.0000 | ORAL_TABLET | Freq: Two times a day (BID) | ORAL | 6 refills | Status: DC

## 2021-05-11 NOTE — Addendum Note (Signed)
Addended byConrad [place] D on: 05/11/2021 07:49 AM   Modules accepted: Orders

## 2021-05-26 ENCOUNTER — Other Ambulatory Visit: Payer: Self-pay

## 2021-05-26 ENCOUNTER — Ambulatory Visit (HOSPITAL_BASED_OUTPATIENT_CLINIC_OR_DEPARTMENT_OTHER)
Admission: RE | Admit: 2021-05-26 | Discharge: 2021-05-26 | Disposition: A | Discharge status: Home / Self Care | Payer: BC Managed Care – PPO | Source: Ambulatory Visit | Attending: Internal Medicine | Admitting: Internal Medicine

## 2021-05-26 DIAGNOSIS — E010 Iodine-deficiency related diffuse (endemic) goiter: Secondary | ICD-10-CM | POA: Diagnosis present

## 2021-05-27 ENCOUNTER — Encounter: Payer: Self-pay | Admitting: Internal Medicine

## 2021-06-02 ENCOUNTER — Other Ambulatory Visit (HOSPITAL_BASED_OUTPATIENT_CLINIC_OR_DEPARTMENT_OTHER): Payer: BC Managed Care – PPO

## 2021-07-23 ENCOUNTER — Ambulatory Visit: Payer: BC Managed Care – PPO | Admitting: Internal Medicine

## 2021-07-27 ENCOUNTER — Ambulatory Visit: Payer: BC Managed Care – PPO | Admitting: Internal Medicine

## 2021-07-28 ENCOUNTER — Ambulatory Visit: Payer: BC Managed Care – PPO | Admitting: Internal Medicine

## 2021-07-30 ENCOUNTER — Ambulatory Visit (INDEPENDENT_AMBULATORY_CARE_PROVIDER_SITE_OTHER): Payer: BC Managed Care – PPO | Admitting: Internal Medicine

## 2021-07-30 ENCOUNTER — Other Ambulatory Visit: Payer: Self-pay

## 2021-07-30 VITALS — BP 126/74 | HR 79 | Temp 98.1°F | Resp 18 | Ht 61.0 in | Wt 159.6 lb

## 2021-07-30 DIAGNOSIS — E039 Hypothyroidism, unspecified: Secondary | ICD-10-CM | POA: Diagnosis not present

## 2021-07-30 DIAGNOSIS — D649 Anemia, unspecified: Secondary | ICD-10-CM

## 2021-07-30 NOTE — Patient Instructions (Signed)
Recommend a Covid booster   GO TO THE LAB : Get the blood work     GO TO THE FRONT DESK, PLEASE SCHEDULE YOUR APPOINTMENTS Come back for  a physical exam by 12-2020

## 2021-07-30 NOTE — Progress Notes (Signed)
Subjective:    Patient ID: Loretta [name], female    DOB: 17-Sep-1991, 30 y.o.   MRN: [id number]  DOS:  07/30/2021 Type of visit - description: f/u  Since the last office visit is doing okay. Today with talk about thyroid disease, anemia, her most recent ultrasound. She saw gynecology for irregular periods, they are better but still very heavy the first few days.   Review of Systems See above   Past Medical History:  Diagnosis Date   ACL tear 11/2014   left   Asthma 03/27/2012   Headache(784.0)    stress/migraine   History of cardiac murmur    "innocent murmur" 2011 - states has resolved   History of strabismus    Hypothyroidism    has not taken medication since 09/2014   Neurofibromatosis, type 1 (von Recklinghausen's disease) (HCC)    Seasonal asthma    prn inhaler    Past Surgical History:  Procedure Laterality Date   ANTERIOR CRUCIATE LIGAMENT REPAIR Left scheduled April 2016   Hamstring graft    STRABISMUS SURGERY     age 42   STRABISMUS SURGERY Right 03/01/2014   Procedure: REPAIR STRABISMUS BILATERAL;  Surgeon: Shara Blazing, MD;  Location: [facility];  Service: Ophthalmology;  Laterality: Right;   TUMOR EXCISION Right as an infant   lower eyelid    Allergies as of 07/30/2021   No Known Allergies      Medication List        Accurate as of July 30, 2021 11:59 PM. If you have any questions, ask your nurse or doctor.          albuterol 108 (90 Base) MCG/ACT inhaler Commonly known as: Ventolin HFA Inhale 2 puffs into the lungs every 6 (six) hours as needed for wheezing or shortness of breath.   Iron 325 (65 Fe) MG Tabs Take 1 tablet (325 mg total) by mouth 2 (two) times daily before a meal.   levothyroxine 112 MCG tablet Commonly known as: SYNTHROID Take 1 tablet (112 mcg total) by mouth daily before breakfast.   VITAMIN C PO Take 1 tablet by mouth daily.           Objective:   Physical Exam BP 126/74   Pulse 79   Temp  98.1 F (36.7 C)   Resp 18   Ht 5\' 1"  (1.549 m)   Wt 159 lb 9.6 oz (72.4 kg)   LMP 07/29/2021   SpO2 98%   BMI 30.16 kg/m  General:   Well developed, NAD, BMI noted. HEENT:  Normocephalic . Face symmetric, atraumatic Neck: Slightly enlarged thyroid more so on the R, nodular Lungs:  CTA B Normal respiratory effort, no intercostal retractions, no accessory muscle use. Heart: RRR,  no murmur.  Lower extremities: no pretibial edema bilaterally  Skin: Not pale. Not jaundice Neurologic:  alert & oriented X3.  Speech normal, gait appropriate for age and unassisted Psych--  Cognition and judgment appear intact.  Cooperative with normal attention span and concentration.  Behavior appropriate. No anxious or depressed appearing.      Assessment    ASSESSMENT Hypothyroidism  Thyroid nodule  GERD H/o asthma , prn albuterol, seasonal  Neurofibromatoses  FH: First cousin dies form a brain aneurysm Not on BP as off 12/2020  PLAN Hypothyroidism: On Synthroid, check TSH Thyroid nodule: Recent 01/2021 report reviewed, next ultrasound 04-2022 Anemia: Last hemoglobin stable, ferritin was still low, I recommended to continue iron supplements.  She misses iron  supplements some days, encourage compliance, recheck on RTC Irregular periods: Saw gynecology 02/27/2021, periods still heavy but more regular Preventive care:  Rec flu shot: Declined  Recommend a COVID shot, plans to proceed   This visit occurred during the SARS-CoV-2 public health emergency.  Safety protocols were in place, including screening questions prior to the visit, additional usage of staff PPE, and extensive cleaning of exam room while observing appropriate contact time as indicated for disinfecting solutions.

## 2021-07-31 ENCOUNTER — Other Ambulatory Visit: Payer: Self-pay | Admitting: Internal Medicine

## 2021-07-31 LAB — TSH: TSH: 1.05 u[IU]/mL (ref 0.35–5.50)

## 2021-07-31 MED ORDER — LEVOTHYROXINE SODIUM 112 MCG PO TABS: 112.0000 ug | ORAL_TABLET | Freq: Every day | ORAL | 1 refills | Status: DC

## 2021-07-31 NOTE — Assessment & Plan Note (Signed)
Hypothyroidism: On Synthroid, check TSH Thyroid nodule: Recent US report reviewed, next ultrasound 04-2022 Anemia: Last hemoglobin stable, ferritin was still low, I recommended to continue iron supplements.  She misses iron supplements some days, encourage compliance, recheck on RTC Irregular periods: Saw gynecology 02/27/2021, periods still heavy but more regular Preventive care:  Rec flu shot: Declined  Recommend a COVID shot, plans to proceed

## 2021-10-20 IMAGING — DX DG ANKLE COMPLETE 3+V*L*
3 series · 3 of 3 positions shown · non-contrast
Comparison: Ankle radiographs 02/27/2018.

CLINICAL DATA: Sprained ankle 3 days ago.

EXAM:
LEFT ANKLE COMPLETE - 3+ VIEW

[ankle ap]
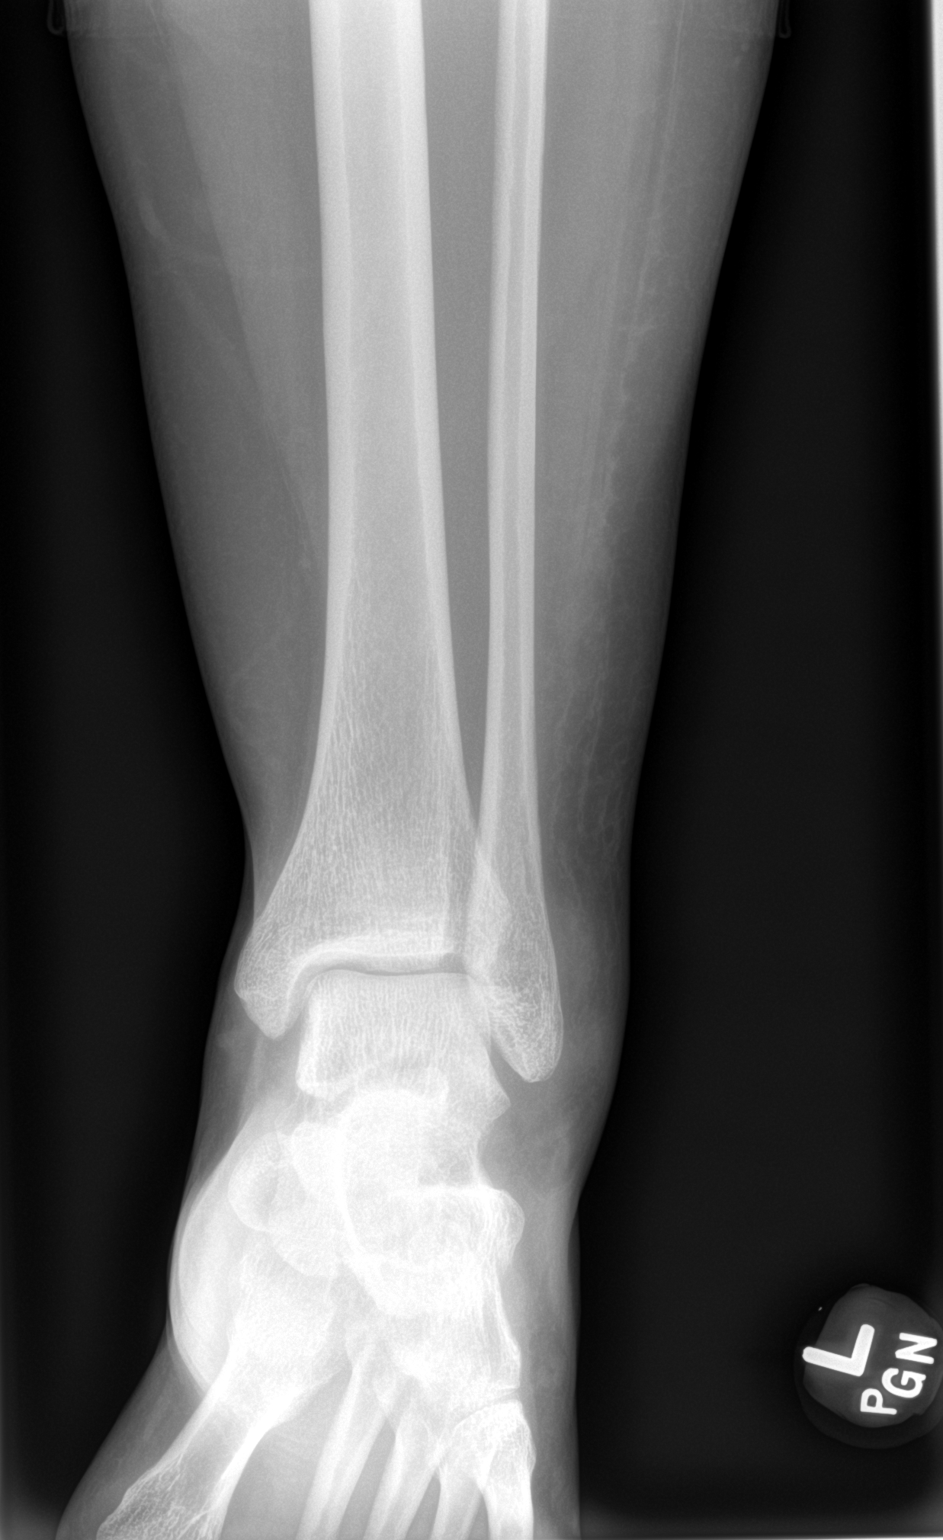

[ankle obl]
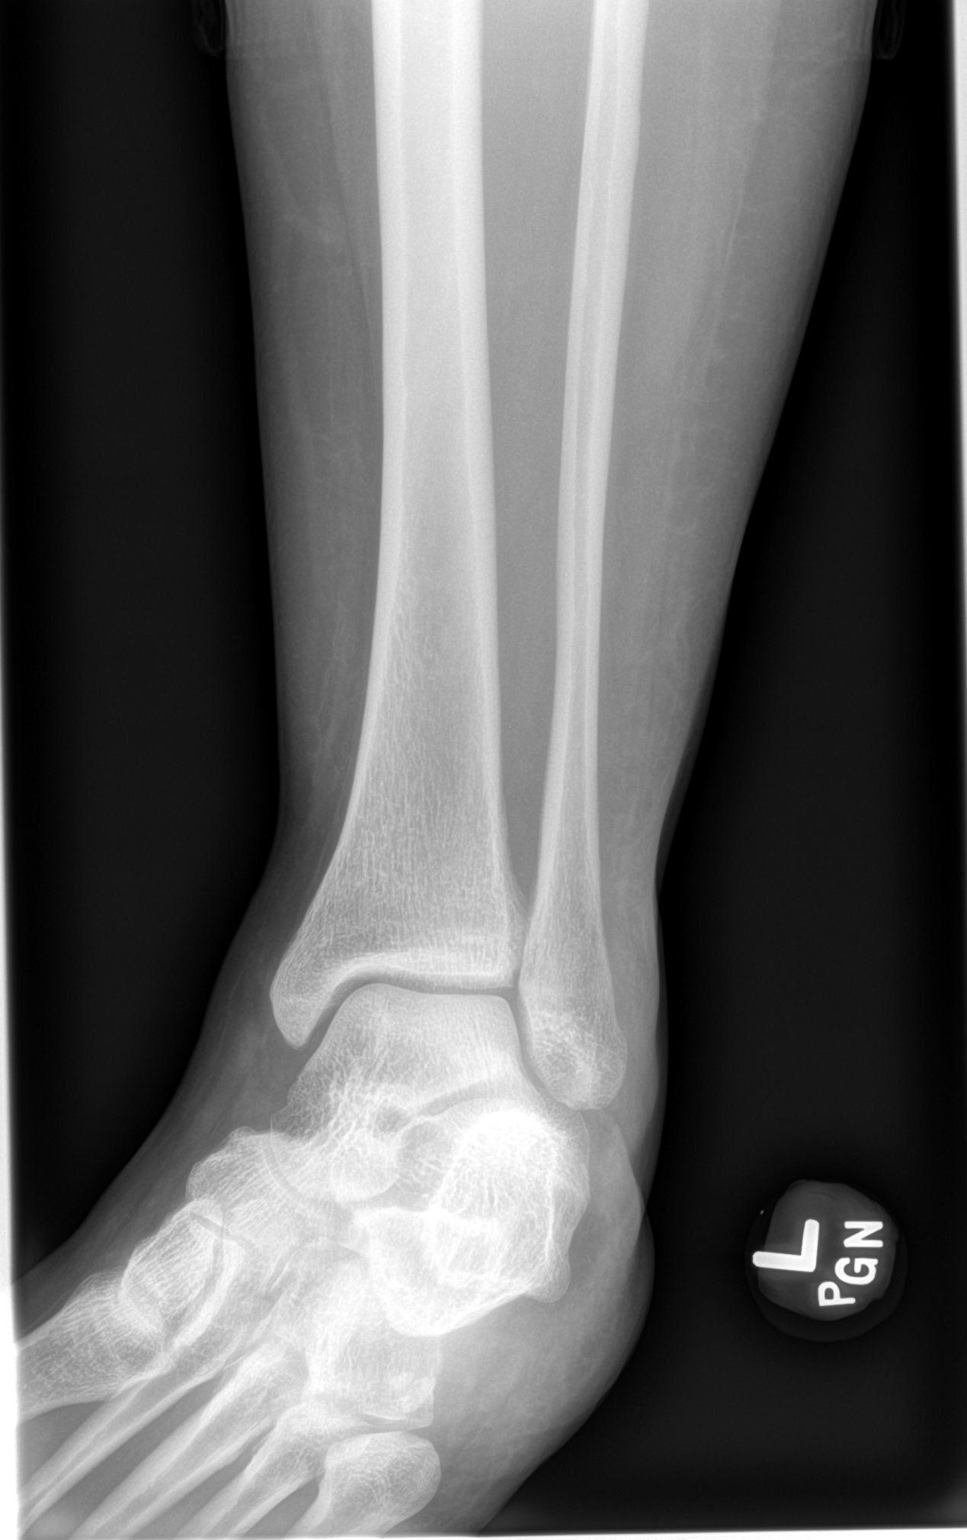

[ankle lat]
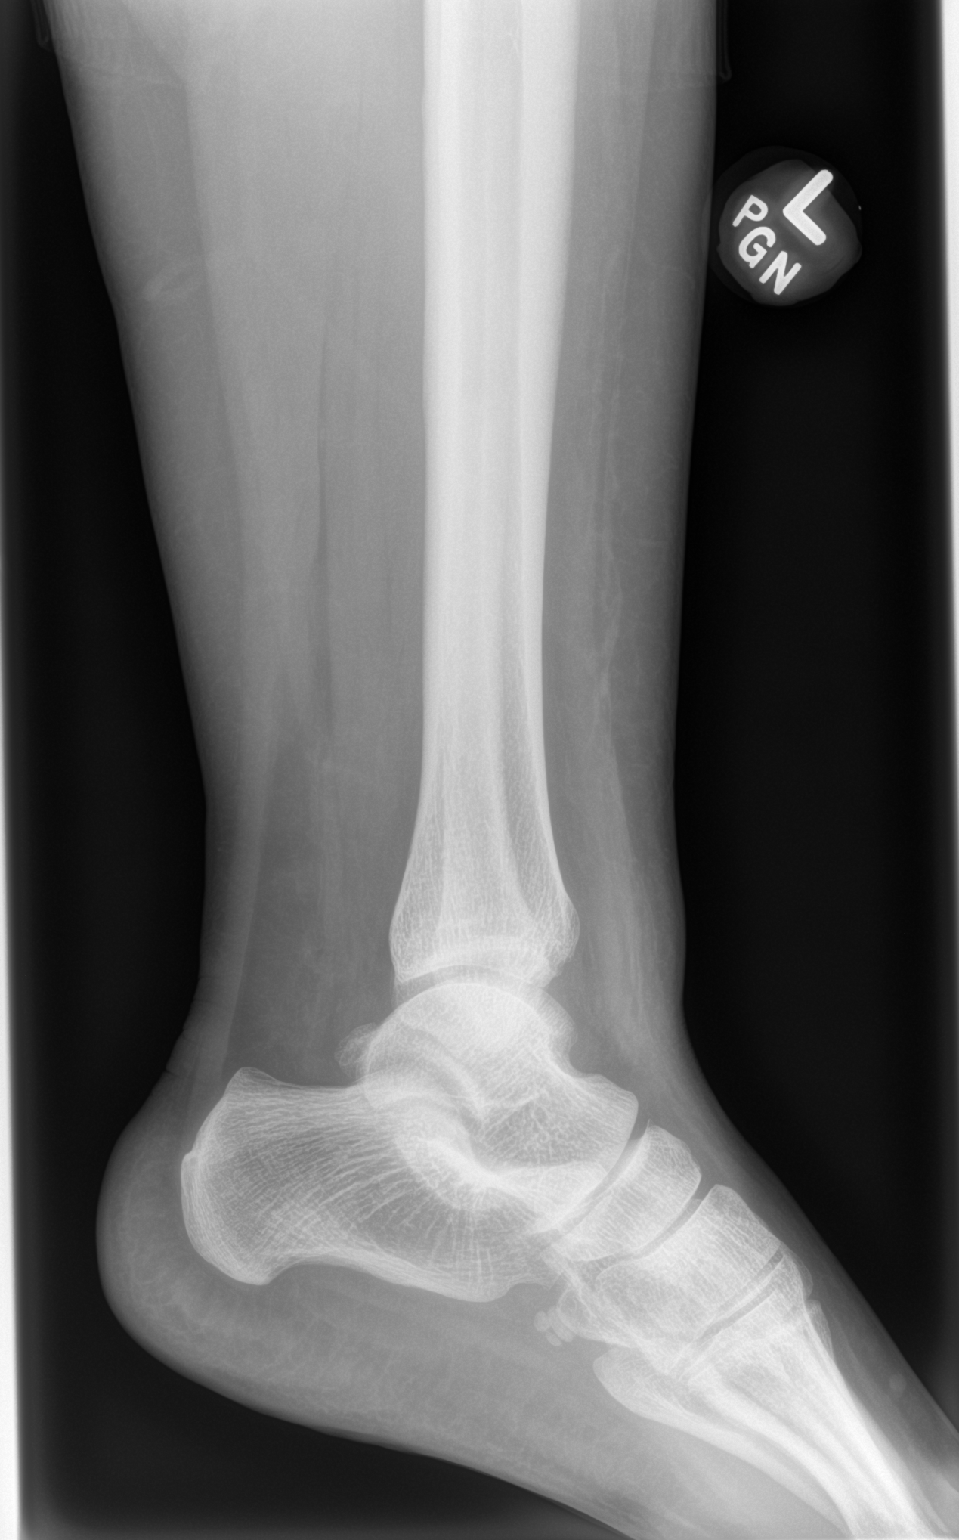

[3 of 3 positions shown; findings below may reference images not displayed]

FINDINGS: The mineralization and alignment are normal. There is no evidence of
acute fracture or dislocation. The joint spaces are preserved.
Multicentric os peroneum and moderate lateral soft tissue swelling
are noted.
IMPRESSION: Lateral soft tissue swelling. No acute osseous findings.

## 2021-10-25 ENCOUNTER — Encounter: Payer: Self-pay | Admitting: Internal Medicine

## 2021-10-26 MED ORDER — LEVOTHYROXINE SODIUM 112 MCG PO TABS: 112.0000 ug | ORAL_TABLET | Freq: Every day | ORAL | 1 refills | Status: DC

## 2021-12-01 DIAGNOSIS — M9901 Segmental and somatic dysfunction of cervical region: Secondary | ICD-10-CM | POA: Diagnosis not present

## 2021-12-01 DIAGNOSIS — M9903 Segmental and somatic dysfunction of lumbar region: Secondary | ICD-10-CM | POA: Diagnosis not present

## 2021-12-01 DIAGNOSIS — M9905 Segmental and somatic dysfunction of pelvic region: Secondary | ICD-10-CM | POA: Diagnosis not present

## 2021-12-01 DIAGNOSIS — M9902 Segmental and somatic dysfunction of thoracic region: Secondary | ICD-10-CM | POA: Diagnosis not present

## 2021-12-01 DIAGNOSIS — M5451 Vertebrogenic low back pain: Secondary | ICD-10-CM | POA: Diagnosis not present

## 2021-12-18 ENCOUNTER — Ambulatory Visit (INDEPENDENT_AMBULATORY_CARE_PROVIDER_SITE_OTHER): Payer: BC Managed Care – PPO | Admitting: Obstetrics & Gynecology

## 2021-12-18 ENCOUNTER — Other Ambulatory Visit: Payer: Self-pay

## 2021-12-18 ENCOUNTER — Encounter: Payer: Self-pay | Admitting: Obstetrics & Gynecology

## 2021-12-18 VITALS — BP 110/70 | HR 68 | Resp 16 | Ht 60.5 in | Wt 163.0 lb

## 2021-12-18 DIAGNOSIS — E039 Hypothyroidism, unspecified: Secondary | ICD-10-CM | POA: Diagnosis not present

## 2021-12-18 DIAGNOSIS — Z308 Encounter for other contraceptive management: Secondary | ICD-10-CM

## 2021-12-18 DIAGNOSIS — Z01419 Encounter for gynecological examination (general) (routine) without abnormal findings: Secondary | ICD-10-CM

## 2021-12-18 DIAGNOSIS — E6609 Other obesity due to excess calories: Secondary | ICD-10-CM

## 2021-12-18 DIAGNOSIS — Z6831 Body mass index (BMI) 31.0-31.9, adult: Secondary | ICD-10-CM

## 2021-12-18 NOTE — Progress Notes (Signed)
? ? ?Loretta [name] Oct 04, 1990 [id number] ? ? ?History:    31 y.o. G0  Single/Virgin.  Lives with parents.  Works in [employer], mining therapy. ?  ?RP:  Established patient presenting for annual gyn exam  ?  ?HPI: Normal menstrual periods every month with normal flow.  No breakthrough bleeding.  Had irregular menses around 02/2021.  Hypothyroidism on Synthroid.  TSH normal in 02/2021 and again in 07/2021. Prolactin normal in 02/2021.  No pelvic pain.  Normal vaginal secretions.  Virgin.  Pap Negative in 09/2017. Urine and bowel movements normal. Breasts normal. Body mass index increased to 31.31.  Exercises on a regular basis. Health labs with family physician Dr. Drue [name]. ?  ?  ?Past medical history,surgical history, family history and social history were all reviewed and documented in the EPIC chart. ? ?Gynecologic History ?Patient's last menstrual period was 11/24/2021 (exact date). ? ?Obstetric History ?OB History  ?Gravida Para Term Preterm AB Living  ?0            ?SAB IAB Ectopic Multiple Live Births  ?           ? ? ? ?ROS: A ROS was performed and pertinent positives and negatives are included in the history. ? GENERAL: No fevers or chills. HEENT: No change in vision, no earache, sore throat or sinus congestion. NECK: No pain or stiffness. CARDIOVASCULAR: No chest pain or pressure. No palpitations. PULMONARY: No shortness of breath, cough or wheeze. GASTROINTESTINAL: No abdominal pain, nausea, vomiting or diarrhea, melena or bright red blood per rectum. GENITOURINARY: No urinary frequency, urgency, hesitancy or dysuria. MUSCULOSKELETAL: No joint or muscle pain, no back pain, no recent trauma. DERMATOLOGIC: No rash, no itching, no lesions. ENDOCRINE: No polyuria, polydipsia, no heat or cold intolerance. No recent change in weight. HEMATOLOGICAL: No anemia or easy bruising or bleeding. NEUROLOGIC: No headache, seizures, numbness, tingling or weakness. PSYCHIATRIC: No depression, no loss of interest in normal activity or  change in sleep pattern.  ?  ? ?Exam: ? ? ?BP 110/70   Pulse 68   Resp 16   Ht 5' 0.5" (1.537 m)   Wt 163 lb (73.9 kg)   LMP 11/24/2021 (Exact Date)   BMI 31.31 kg/m?  ? ?Body mass index is 31.31 kg/m?. ? ?General appearance : Well developed well nourished female. No acute distress ?HEENT: Eyes: no retinal hemorrhage or exudates,  Neck supple, trachea midline, no carotid bruits, no thyroidmegaly ?Lungs: Clear to auscultation, no rhonchi or wheezes, or rib retractions  ?Heart: Regular rate and rhythm, no murmurs or gallops ?Breast:Examined in sitting and supine position were symmetrical in appearance, no palpable masses or tenderness,  no skin retraction, no nipple inversion, no nipple discharge, no skin discoloration, no axillary or supraclavicular lymphadenopathy ?Abdomen: no palpable masses or tenderness, no rebound or guarding ?Extremities: no edema or skin discoloration or tenderness ? ?Pelvic exam done at patient's request (Bimanual exam with 1 finger vaginally as patient is virgin.  No speculum exam):  ?            Vulva: Normal ?            Vagina: No gross lesions or discharge ? Cervix: No gross lesions or discharge ? Uterus  AV, normal size, shape and consistency, non-tender and mobile ? Adnexa  Without masses or tenderness ? Anus: Normal ? ? ?Assessment/Plan:  31 y.o. female for annual exam  ? ?1. Well female exam with routine gynecological exam ?Normal menstrual periods every month with normal flow.  No breakthrough bleeding.  Had irregular menses around 02/2021.  Hypothyroidism on Synthroid.  TSH normal in 02/2021 and again in 07/2021. Prolactin normal in 02/2021.  No pelvic pain.  Normal vaginal secretions.  Virgin.  Pap Negative in 09/2017. Urine and bowel movements normal. Breasts normal. Body mass index increased to 31.31.  Exercises on a regular basis. Health labs with family physician Dr. Drue [name]. ?  ?2. Encounter for other contraceptive management ?Virgin, declines contraception.  Will use condoms  if becomes sexually active. ? ?3. Hypothyroidism, unspecified type ?Well controled on Synthroid. ? ?4. Class 1 obesity due to excess calories without serious comorbidity with body mass index (BMI) of 31.0 to 31.9 in adult ?Lower calorie/carb diet.  Continue fitness activities. ? ?Other orders ?- BIOTIN PO; Take by mouth.  ? ?[name] MD, 9:19 AM 12/18/2021 ? ?  ?

## 2022-01-01 ENCOUNTER — Encounter: Payer: BC Managed Care – PPO | Admitting: Internal Medicine

## 2022-01-19 ENCOUNTER — Encounter: Payer: Self-pay | Admitting: Internal Medicine

## 2022-01-19 ENCOUNTER — Ambulatory Visit (INDEPENDENT_AMBULATORY_CARE_PROVIDER_SITE_OTHER): Payer: BC Managed Care – PPO | Admitting: Internal Medicine

## 2022-01-19 VITALS — BP 118/82 | HR 68 | Temp 98.6°F | Resp 16 | Ht 60.5 in | Wt 162.1 lb

## 2022-01-19 DIAGNOSIS — E039 Hypothyroidism, unspecified: Secondary | ICD-10-CM | POA: Diagnosis not present

## 2022-01-19 DIAGNOSIS — D649 Anemia, unspecified: Secondary | ICD-10-CM | POA: Diagnosis not present

## 2022-01-19 DIAGNOSIS — Z0000 Encounter for general adult medical examination without abnormal findings: Secondary | ICD-10-CM | POA: Diagnosis not present

## 2022-01-19 DIAGNOSIS — E559 Vitamin D deficiency, unspecified: Secondary | ICD-10-CM | POA: Diagnosis not present

## 2022-01-19 LAB — COMPREHENSIVE METABOLIC PANEL
ALT: 14 U/L (ref 0–35)
AST: 13 U/L (ref 0–37)
Albumin: 4.4 g/dL (ref 3.5–5.2)
Alkaline Phosphatase: 41 U/L (ref 39–117)
BUN: 16 mg/dL (ref 6–23)
CO2: 23 mEq/L (ref 19–32)
Calcium: 9.4 mg/dL (ref 8.4–10.5)
Chloride: 105 mEq/L (ref 96–112)
Creatinine, Ser: 0.75 mg/dL (ref 0.40–1.20)
GFR: 106.95 mL/min (ref >60.00)
Glucose, Bld: 82 mg/dL (ref 70–99)
Potassium: 4.4 mEq/L (ref 3.5–5.1)
Sodium: 137 mEq/L (ref 135–145)
Total Bilirubin: 0.4 mg/dL (ref 0.2–1.2)
Total Protein: 7.1 g/dL (ref 6.0–8.3)

## 2022-01-19 LAB — CBC WITH DIFFERENTIAL/PLATELET
Basophils Absolute: 0.1 10*3/uL (ref 0.0–0.1)
Basophils Relative: 1.2 % (ref 0.0–3.0)
Eosinophils Absolute: 0.1 10*3/uL (ref 0.0–0.7)
Eosinophils Relative: 2.7 % (ref 0.0–5.0)
HCT: 37.1 % (ref 36.0–46.0)
Hemoglobin: 12.3 g/dL (ref 12.0–15.0)
Lymphocytes Relative: 30.7 % (ref 12.0–46.0)
Lymphs Abs: 1.3 10*3/uL (ref 0.7–4.0)
MCHC: 33.1 g/dL (ref 30.0–36.0)
MCV: 82.5 fl (ref 78.0–100.0)
Monocytes Absolute: 0.6 10*3/uL (ref 0.1–1.0)
Monocytes Relative: 14.1 % — ABNORMAL HIGH (ref 3.0–12.0)
Neutro Abs: 2.2 10*3/uL (ref 1.4–7.7)
Neutrophils Relative %: 51.3 % (ref 43.0–77.0)
Platelets: 338 10*3/uL (ref 150.0–400.0)
RBC: 4.49 Mil/uL (ref 3.87–5.11)
RDW: 14.7 % (ref 11.5–15.5)
WBC: 4.3 10*3/uL (ref 4.0–10.5)

## 2022-01-19 LAB — VITAMIN D 25 HYDROXY (VIT D DEFICIENCY, FRACTURES): VITD: 19.04 ng/mL — ABNORMAL LOW (ref 30.00–100.00)

## 2022-01-19 LAB — FERRITIN: Ferritin: 5.7 ng/mL — ABNORMAL LOW (ref 10.0–291.0)

## 2022-01-19 LAB — IRON: Iron: 38 ug/dL — ABNORMAL LOW (ref 42–145)

## 2022-01-19 LAB — TSH: TSH: 1.92 u[IU]/mL (ref 0.35–5.50)

## 2022-01-19 NOTE — Progress Notes (Signed)
? ?Subjective:  ? ? Patient ID: Loretta [name], female    DOB: 10/27/1990, 31 y.o.   MRN: [id number] ? ?DOS:  01/19/2022 ?Type of visit - description: cpx ? ?Since the last office visit is doing well. ?From time to time has dizziness that last few seconds when she stands up.  No headaches, no presyncope. ?Menses  are now regular, last 5 days, at least 2-3 days bleeding is heavy. ?History of anemia, taking iron correctly. ? ?Review of Systems ?Denies abdominal pain, rectal bleeding.  Nausea vomiting ? ?Other than above, a 14 point review of systems is negative  ?  ? ?Past Medical History:  ?Diagnosis Date  ? ACL tear 11/2014  ? left  ? Asthma 03/27/2012  ? Headache(784.0)   ? stress/migraine  ? History of cardiac murmur   ? "innocent murmur" 2011 - states has resolved  ? History of strabismus   ? Hypothyroidism   ? has not taken medication since 09/2014  ? Neurofibromatosis, type 1 (von Recklinghausen's disease) ([facility])   ? Seasonal asthma   ? prn inhaler  ? ? ?Past Surgical History:  ?Procedure Laterality Date  ? ANTERIOR CRUCIATE LIGAMENT REPAIR Left scheduled April 2016  ? Hamstring graft   ? STRABISMUS SURGERY    ? age 31  ? STRABISMUS SURGERY Right 03/01/2014  ? Procedure: REPAIR STRABISMUS BILATERAL;  Surgeon: Derry Skill, MD;  Location: [facility];  Service: Ophthalmology;  Laterality: Right;  ? TUMOR EXCISION Right as an infant  ? lower eyelid  ? ?Social History  ? ?Socioeconomic History  ? Marital status: Single  ?  Spouse name: Not on file  ? Number of children: 0  ? Years of education: College  ? Highest education level: Not on file  ?Occupational History  ? Occupation: Insurance claims handler, works full time  ?Tobacco Use  ? Smoking status: Never  ? Smokeless tobacco: Never  ?Vaping Use  ? Vaping Use: Never used  ?Substance and Sexual Activity  ? Alcohol use: Yes  ?  Comment: social  ? Drug use: No  ? Sexual activity: Never  ?  Birth control/protection: Abstinence  ?  Comment: VIRGIN  ?Other  Topics Concern  ? Not on file  ?Social History Narrative  ? G0P0  ? Patient lives at home with her parents.   ? Left handed.   ? Rodrigo Ran 01-2014  ? ?Social Determinants of Health  ? ?Financial Resource Strain: Not on file  ?Food Insecurity: Not on file  ?Transportation Needs: Not on file  ?Physical Activity: Not on file  ?Stress: Not on file  ?Social Connections: Not on file  ?Intimate Partner Violence: Not on file  ? ? ? ?Current Outpatient Medications  ?Medication Instructions  ? albuterol (VENTOLIN HFA) 108 (90 Base) MCG/ACT inhaler 2 puffs, Inhalation, Every 6 hours PRN  ? Ascorbic Acid (VITAMIN C PO) 1 tablet, Oral, Daily  ? BIOTIN PO Oral  ? Iron 325 mg, Oral, 2 times daily before meals  ? levothyroxine (SYNTHROID) 112 mcg, Oral, Daily before breakfast  ? ? ?   ?Objective:  ? Physical Exam ?BP 118/82 (BP Location: Left Arm, Patient Position: Sitting, Cuff Size: Small)   Pulse 68   Temp 98.6 ?F (37 ?C) (Oral)   Resp 16   Ht 5' 0.5" (1.537 m)   Wt 162 lb 2 oz (73.5 kg)   LMP 12/24/2021 (Exact Date)   SpO2 97%   BMI 31.14 kg/m?  ?General: ?Well developed, NAD, BMI  noted ?Neck: No tender R hypothyroidism: Thyromegaly, mild ?HEENT:  ?Normocephalic . Face symmetric, atraumatic ?Lungs:  ?CTA B ?Normal respiratory effort, no intercostal retractions, no accessory muscle use. ?Heart: RRR,  no murmur.  ?Abdomen:  ?Not distended, soft, non-tender. No rebound or rigidity.   ?Lower extremities: no pretibial edema bilaterally  ?Skin: Exposed areas without rash. Not pale. Not jaundice ?Neurologic:  ?alert & oriented X3.  ?Speech normal, gait appropriate for age and unassisted ?Strength symmetric and appropriate for age.  ?Psych: ?Cognition and judgment appear intact.  ?Cooperative with normal attention span and concentration.  ?Behavior appropriate. ?No anxious or depressed appearing. ? ?   ?Assessment   ? ? ASSESSMENT ?Hypothyroidism  ?Thyroid nodule  ?GERD ?H/o asthma , prn albuterol, seasonal  ?Neurofibromatoses   ?FH: First cousin dies form a brain aneurysm ?Not on BCP as off 12/2021 ? ?PLAN ?Here for CPX ?Hypothyroidism: Good compliance with medicines.  Checking labs ?Thyroid nodule: Due for a [place] August 2023 ?Asthma: Symptoms are slightly worse due to pollen but still  well controlled with medications ?Anemia: No GI symptoms, likely menses related, on iron, takes it correctly away from Synthroid.  Checking labs. ?Dizziness: Mild,  last few seconds.  Rec observation and good hydration ?RTC 6 months ? ? ?  ?This visit occurred during the SARS-CoV-2 public health emergency.  Safety protocols were in place, including screening questions prior to the visit, additional usage of staff PPE, and extensive cleaning of exam room while observing appropriate contact time as indicated for disinfecting solutions.  ? ?

## 2022-01-19 NOTE — Patient Instructions (Signed)
You are due for a thyroid ultrasound in August.  Please reach out if that is not scheduled for you ? ?GO TO THE LAB : Get the blood work   ? ? ?GO TO THE FRONT DESK, PLEASE SCHEDULE YOUR APPOINTMENTS ?Come back for a checkup in 6 months ?

## 2022-01-20 ENCOUNTER — Encounter: Payer: Self-pay | Admitting: Internal Medicine

## 2022-01-20 NOTE — Assessment & Plan Note (Signed)
Tdap 2020 ?[place] booster is an option ?Female care: saw gyn last month ?Diet and exercise: Discussed, doing well  ?Labs: CMP, CBC, iron, ferritin, TSH, vitamin D. ?

## 2022-01-20 NOTE — Assessment & Plan Note (Signed)
Here for CPX ?Hypothyroidism: Good compliance with medicines.  Checking labs ?Thyroid nodule: Due for a [place] August 2023 ?Asthma: Symptoms are slightly worse due to pollen but still  well controlled with medications ?Anemia: No GI symptoms, likely menses related, on iron, takes it correctly away from Synthroid.  Checking labs. ?Dizziness: Mild,  last few seconds.  Rec observation and good hydration ?RTC 6 months ?

## 2022-01-22 MED ORDER — VITAMIN D (ERGOCALCIFEROL) 1.25 MG (50000 UNIT) PO CAPS: 50000.0000 [IU] | ORAL_CAPSULE | ORAL | 0 refills | Status: DC

## 2022-01-22 NOTE — Addendum Note (Signed)
Addended byConrad [place] D on: 01/22/2022 07:51 AM ? ? Modules accepted: Orders ? ?

## 2022-06-04 ENCOUNTER — Other Ambulatory Visit: Payer: Self-pay

## 2022-06-04 DIAGNOSIS — E041 Nontoxic single thyroid nodule: Secondary | ICD-10-CM

## 2022-06-15 ENCOUNTER — Encounter: Payer: Self-pay | Admitting: Internal Medicine

## 2022-07-01 ENCOUNTER — Telehealth (HOSPITAL_BASED_OUTPATIENT_CLINIC_OR_DEPARTMENT_OTHER): Payer: Self-pay

## 2022-07-19 ENCOUNTER — Ambulatory Visit: Payer: BC Managed Care – PPO | Admitting: Internal Medicine

## 2022-07-23 ENCOUNTER — Ambulatory Visit (INDEPENDENT_AMBULATORY_CARE_PROVIDER_SITE_OTHER): Payer: BC Managed Care – PPO | Admitting: Internal Medicine

## 2022-07-23 ENCOUNTER — Encounter: Payer: Self-pay | Admitting: Internal Medicine

## 2022-07-23 ENCOUNTER — Ambulatory Visit: Payer: BC Managed Care – PPO | Admitting: Internal Medicine

## 2022-07-23 VITALS — BP 116/84 | HR 66 | Temp 98.6°F | Resp 16 | Ht 60.5 in | Wt 154.5 lb

## 2022-07-23 DIAGNOSIS — D500 Iron deficiency anemia secondary to blood loss (chronic): Secondary | ICD-10-CM

## 2022-07-23 DIAGNOSIS — E010 Iodine-deficiency related diffuse (endemic) goiter: Secondary | ICD-10-CM | POA: Diagnosis not present

## 2022-07-23 DIAGNOSIS — D649 Anemia, unspecified: Secondary | ICD-10-CM | POA: Diagnosis not present

## 2022-07-23 DIAGNOSIS — E039 Hypothyroidism, unspecified: Secondary | ICD-10-CM | POA: Diagnosis not present

## 2022-07-23 DIAGNOSIS — J4520 Mild intermittent asthma, uncomplicated: Secondary | ICD-10-CM

## 2022-07-23 DIAGNOSIS — L989 Disorder of the skin and subcutaneous tissue, unspecified: Secondary | ICD-10-CM | POA: Diagnosis not present

## 2022-07-23 DIAGNOSIS — E559 Vitamin D deficiency, unspecified: Secondary | ICD-10-CM

## 2022-07-23 MED ORDER — ALBUTEROL SULFATE HFA 108 (90 BASE) MCG/ACT IN AERS: 2.0000 | INHALATION_SPRAY | Freq: Four times a day (QID) | RESPIRATORY_TRACT | 5 refills | Status: DC | PRN

## 2022-07-23 NOTE — Patient Instructions (Addendum)
Continue taking your thyroid medication early in the morning empty stomach  Take iron supplements at least once daily, before supper on an empty stomach. You can take it with vitamin C to increase absorption  Take vitamin D daily at any time of the day, 2000 units.  We will refer you to a dermatologist.  If the area increase in size of external bleeding call anytime    GO TO THE LAB : Get the blood work     [place], Declo back for   physical exam by 12-2022

## 2022-07-23 NOTE — Addendum Note (Signed)
Addended by: Cortlandt Capuano D on: 07/23/2022 03:41 PM   Modules accepted: Orders  

## 2022-07-23 NOTE — Progress Notes (Unsigned)
Subjective:    Patient ID: Loretta [name], female    DOB: 22-Aug-1991, 31 y.o.   MRN: [id number]  DOS:  07/23/2022 Type of visit - description: Routine follow-up  In general feeling well. Developed a skin lesion at the left elbow few months ago.  She is concerned about it.  Area has not been bleeding. We also talk about hypothyroidism, iron deficiency and vitamin D deficiency. Needs a refill inhalers  Review of Systems See above   Past Medical History:  Diagnosis Date   ACL tear 11/2014   left   Asthma 03/27/2012   Headache(784.0)    stress/migraine   History of cardiac murmur    "innocent murmur" 2011 - states has resolved   History of strabismus    Hypothyroidism    has not taken medication since 09/2014   Neurofibromatosis, type 1 (von Recklinghausen's disease) ([place])    Seasonal asthma    prn inhaler    Past Surgical History:  Procedure Laterality Date   ANTERIOR CRUCIATE LIGAMENT REPAIR Left scheduled April 2016   Hamstring graft    STRABISMUS SURGERY     age 69   STRABISMUS SURGERY Right 03/01/2014   Procedure: REPAIR STRABISMUS BILATERAL;  Surgeon: Derry Skill, MD;  Location: [facility];  Service: Ophthalmology;  Laterality: Right;   TUMOR EXCISION Right as an infant   lower eyelid    Current Outpatient Medications  Medication Instructions   albuterol (VENTOLIN HFA) 108 (90 Base) MCG/ACT inhaler 2 puffs, Inhalation, Every 6 hours PRN   Ascorbic Acid (VITAMIN C PO) 1 tablet, Oral, Daily   BIOTIN PO Oral   Iron 325 mg, Oral, 2 times daily before meals   levothyroxine (SYNTHROID) 112 mcg, Oral, Daily before breakfast   Vitamin D (Ergocalciferol) (DRISDOL) 50,000 Units, Oral, Every 7 days       Objective:   Physical Exam BP 116/84   Pulse 66   Temp 98.6 F (37 C) (Oral)   Resp 16   Ht 5' 0.5" (1.537 m)   Wt 154 lb 8 oz (70.1 kg)   LMP 07/21/2022 (Exact Date)   SpO2 96%   BMI 29.68 kg/m  General:   Well developed, NAD, BMI  noted. HEENT:  Normocephalic . Face symmetric, atraumatic Lungs:  CTA B Normal respiratory effort, no intercostal retractions, no accessory muscle use. Heart: RRR,  no murmur.  Lower extremities: no pretibial edema bilaterally  Skin: Near the left elbow: Has round, dark red, elevated, 3 to 4 mm in diameter red lesion.  Not ulcerated, no bleeding. Neurologic:  alert & oriented X3.  Speech normal, gait appropriate for age and unassisted Psych--  Cognition and judgment appear intact.  Cooperative with normal attention span and concentration.  Behavior appropriate. No anxious or depressed appearing.      Assessment    ASSESSMENT Hypothyroidism  Thyroid nodule  GERD H/o asthma , prn albuterol, seasonal  Neurofibromatoses  FH: First cousin dies form a brain aneurysm Vit  D def  Not on BCP    PLAN Hypothyroidism: Good medication compliant, check TSH  Thyroid nodule: Was due for [place] 04-2022, she has not and will not be able to pursue it at this time. Vitamin D deficiency: Had ergocalciferol, take vitamin D "sometimes".  Encourage good compliance. Iron deficiency: Frequently forgets supplements, she knows not to take it with Synthroid.  Recommend to take iron before lunch on an empty stomach.  No GI symptoms.  Checking levels. Asthma: RF albuterol, uses prn,  this time of the year typically sxs increase a little  Skin lesion: new. Refer to dermatology RTC CPX [phone]

## 2022-07-24 DIAGNOSIS — E559 Vitamin D deficiency, unspecified: Secondary | ICD-10-CM | POA: Insufficient documentation

## 2022-07-24 LAB — FERRITIN: Ferritin: 4 ng/mL — ABNORMAL LOW (ref 16–154)

## 2022-07-24 LAB — HEMOGLOBIN: Hemoglobin: 11.8 g/dL (ref 11.7–15.5)

## 2022-07-24 LAB — TSH: TSH: 4.43 mIU/L

## 2022-07-24 LAB — IRON: Iron: 42 ug/dL (ref 40–190)

## 2022-07-24 NOTE — Assessment & Plan Note (Signed)
Hypothyroidism: Good medication compliant, check TSH  Thyroid nodule: Was due for [place] 04-2022, she has not and will not be able to pursue it at this time. Vitamin D deficiency: Had ergocalciferol, take vitamin D "sometimes".  Encourage good compliance. Iron deficiency: Frequently forgets supplements, she knows not to take it with Synthroid.  Recommend to take iron before lunch on an empty stomach.  No GI symptoms.  Checking levels. Asthma: RF albuterol, uses prn, this time of the year typically sxs increase a little  Skin lesion: new. Refer to dermatology RTC CPX [phone]

## 2022-08-23 ENCOUNTER — Encounter: Payer: Self-pay | Admitting: Internal Medicine

## 2022-08-25 ENCOUNTER — Ambulatory Visit (INDEPENDENT_AMBULATORY_CARE_PROVIDER_SITE_OTHER): Payer: BC Managed Care – PPO | Admitting: Family Medicine

## 2022-08-25 ENCOUNTER — Encounter: Payer: Self-pay | Admitting: Family Medicine

## 2022-08-25 ENCOUNTER — Ambulatory Visit: Payer: Self-pay

## 2022-08-25 VITALS — BP 118/60 | Ht 61.0 in | Wt 154.0 lb

## 2022-08-25 DIAGNOSIS — M722 Plantar fascial fibromatosis: Secondary | ICD-10-CM

## 2022-08-25 DIAGNOSIS — M79671 Pain in right foot: Secondary | ICD-10-CM

## 2022-08-25 NOTE — Progress Notes (Signed)
  Loretta [name] - 31 y.o. female MRN [id number]  Date of birth: Sep 05, 1991  SUBJECTIVE:  Including CC & ROS.  No chief complaint on file.   Loretta [name] is a 31 y.o. female that is presenting with right hip pain.  The pain has been ongoing for few weeks.  Notices the pain when she is walking.    Review of Systems See HPI   HISTORY: Past Medical, Surgical, Social, and Family History Reviewed & Updated per EMR.   Pertinent Historical Findings include:  Past Medical History:  Diagnosis Date   ACL tear 11/2014   left   Asthma 03/27/2012   Headache(784.0)    stress/migraine   History of cardiac murmur    "innocent murmur" 2011 - states has resolved   History of strabismus    Hypothyroidism    has not taken medication since 09/2014   Neurofibromatosis, type 1 (von Recklinghausen's disease) ([facility])    Seasonal asthma    prn inhaler    Past Surgical History:  Procedure Laterality Date   ANTERIOR CRUCIATE LIGAMENT REPAIR Left scheduled April 2016   Hamstring graft    STRABISMUS SURGERY     age 43   STRABISMUS SURGERY Right 03/01/2014   Procedure: REPAIR STRABISMUS BILATERAL;  Surgeon: Shara Blazing, MD;  Location: [facility];  Service: Ophthalmology;  Laterality: Right;   TUMOR EXCISION Right as an infant   lower eyelid     PHYSICAL EXAM:  VS: BP 118/60   Ht 5\' 1"  (1.549 m)   Wt 154 lb (69.9 kg)   BMI 29.10 kg/m  Physical Exam Gen: NAD, alert, cooperative with exam, well-appearing MSK:  Neurovascularly intact    Limited ultrasound: Right foot pain:  Right foot: Mild thickening of the plantar fascial at the calcaneus. No nodules appreciated. No tears  Summary: Findings consistent with [place] fasciitis  Ultrasound and interpretation by , MD    ASSESSMENT & PLAN:   Plantar fasciitis of right foot Acutely occurring. Has findings and exam consistent with plantar fasciitis.  - counseled on home exercise and supportive care - midfoot  arch straps  - green sport insoles  - could consider physical therapy

## 2022-08-25 NOTE — Assessment & Plan Note (Signed)
Acutely occurring. Has findings and exam consistent with plantar fasciitis.  - counseled on home exercise and supportive care - midfoot arch straps  - green sport insoles  - could consider physical therapy

## 2022-08-25 NOTE — Patient Instructions (Signed)
Nice to meet you Please use ice as needed  Please try the exercises  Please try the straps  Please try the insoles. If the insoles help then we can try custom orthotics.   Please send me a message in MyChart with any questions or updates.  Please see me back in 4 weeks.   --Dr. Jordan Likes

## 2022-09-09 ENCOUNTER — Other Ambulatory Visit: Payer: Self-pay | Admitting: Internal Medicine

## 2022-09-15 ENCOUNTER — Telehealth: Payer: Self-pay | Admitting: *Deleted

## 2022-09-15 MED ORDER — ALBUTEROL SULFATE HFA 108 (90 BASE) MCG/ACT IN AERS: 2.0000 | INHALATION_SPRAY | Freq: Four times a day (QID) | RESPIRATORY_TRACT | 5 refills | Status: DC | PRN

## 2022-09-15 NOTE — Telephone Encounter (Signed)
Synthroid refilled on 09/10/22, rx for albuterol sent.

## 2022-09-15 NOTE — Telephone Encounter (Signed)
Who Is Calling Patient / Member / Family / Caregiver Caller Name [name] Caller Phone Number [phone] Patient Name Loretta [name] Patient DOB 01-Dec-1990 Call Type Message Only Information Provided Reason for Call Medication Question / Request Initial Comment Caller needing medication refill for Levothyroxine and Albuterol Disp. Time Disposition Final User 09/15/2022 7:45:16 AM General Information Provided Yes Tiburcio Pea, Lanette

## 2022-09-24 ENCOUNTER — Ambulatory Visit: Payer: BC Managed Care – PPO | Admitting: Family Medicine

## 2022-11-19 DIAGNOSIS — D2262 Melanocytic nevi of left upper limb, including shoulder: Secondary | ICD-10-CM | POA: Diagnosis not present

## 2022-11-19 DIAGNOSIS — D485 Neoplasm of uncertain behavior of skin: Secondary | ICD-10-CM | POA: Diagnosis not present

## 2022-12-22 ENCOUNTER — Ambulatory Visit: Payer: BC Managed Care – PPO | Admitting: Obstetrics & Gynecology

## 2023-01-10 ENCOUNTER — Encounter: Payer: Self-pay | Admitting: *Deleted

## 2023-01-11 IMAGING — US US THYROID
1 series · 13 of 25 positions shown · non-contrast
Comparison: 05/23/2020

CLINICAL DATA: Thyromegaly

EXAM:
THYROID ULTRASOUND
TECHNIQUE: Ultrasound examination of the thyroid gland and adjacent soft
tissues was performed.

[Series 1: us thyroid · 33 acquisitions, 13 frames shown]
[im 1/33]
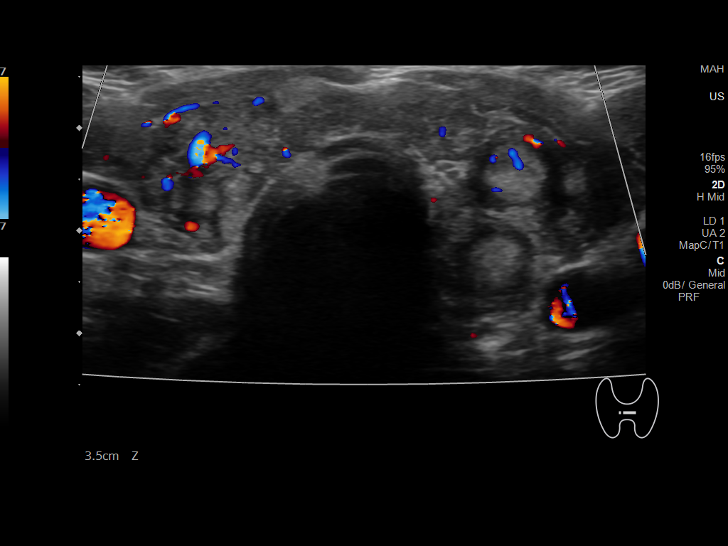
[im 3/33]
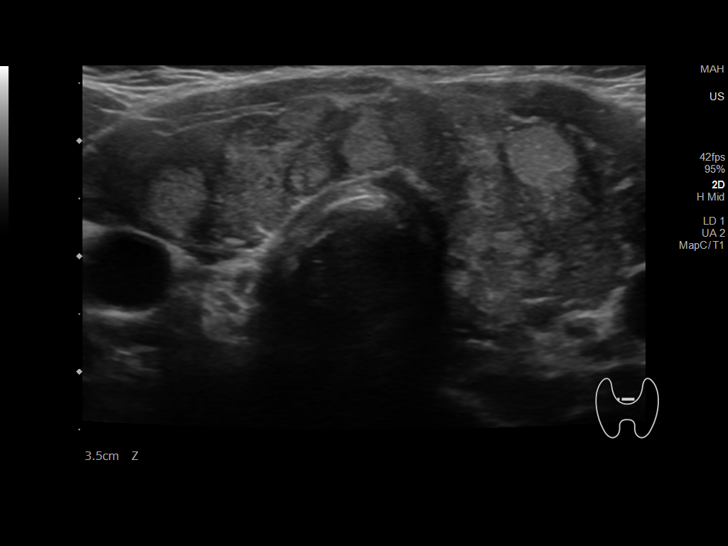
[im 6/33]
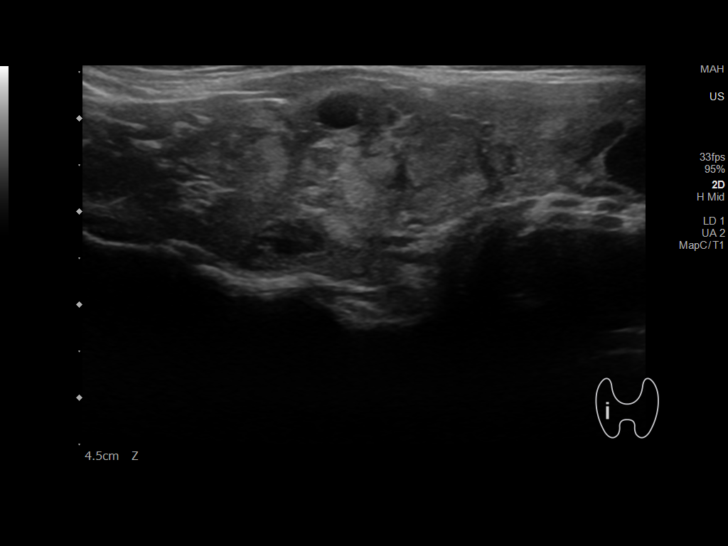
[im 9/33]
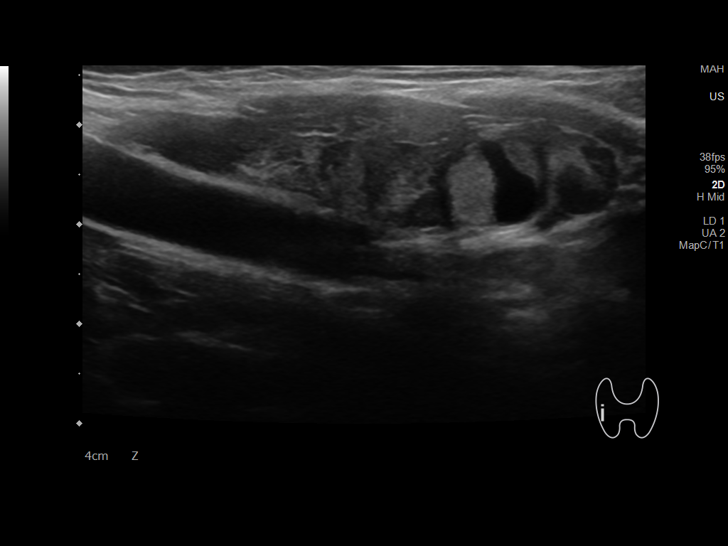
[im 11/33]
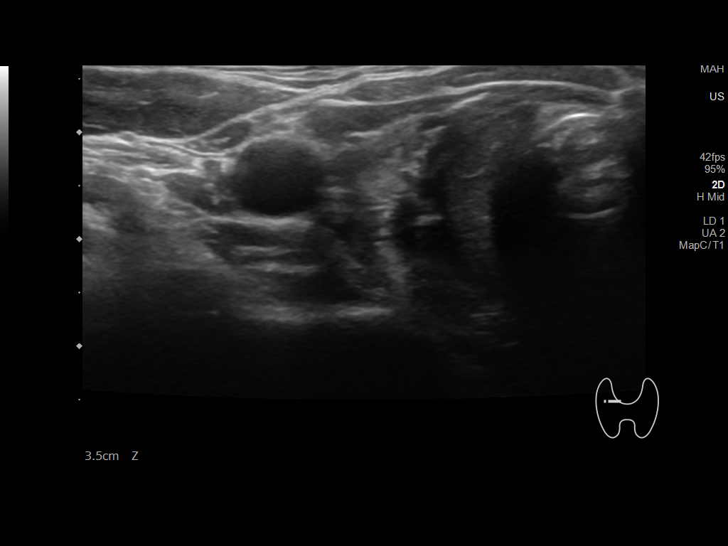
[im 14/33]
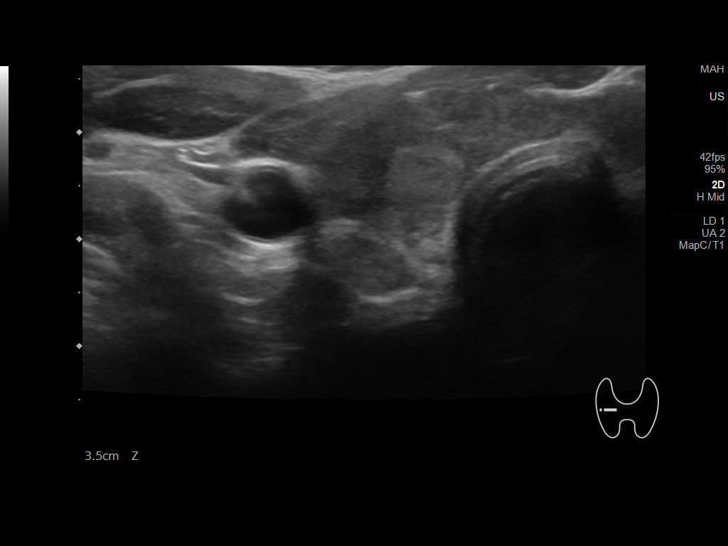
[im 17/33]
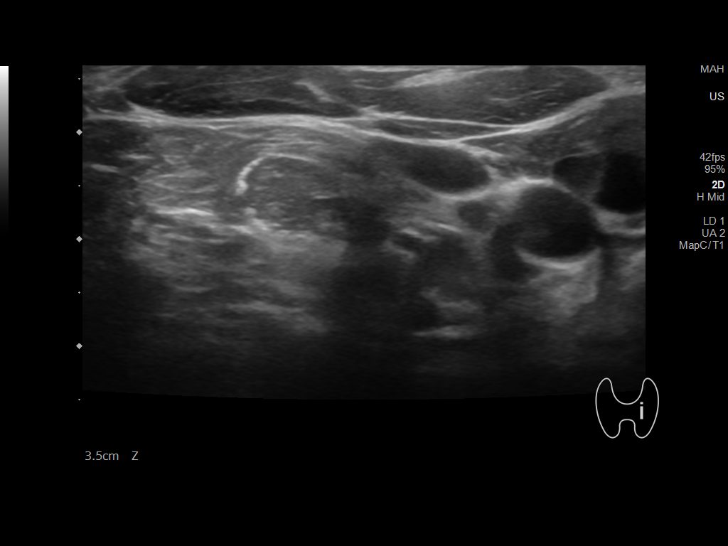
[im 19/33]
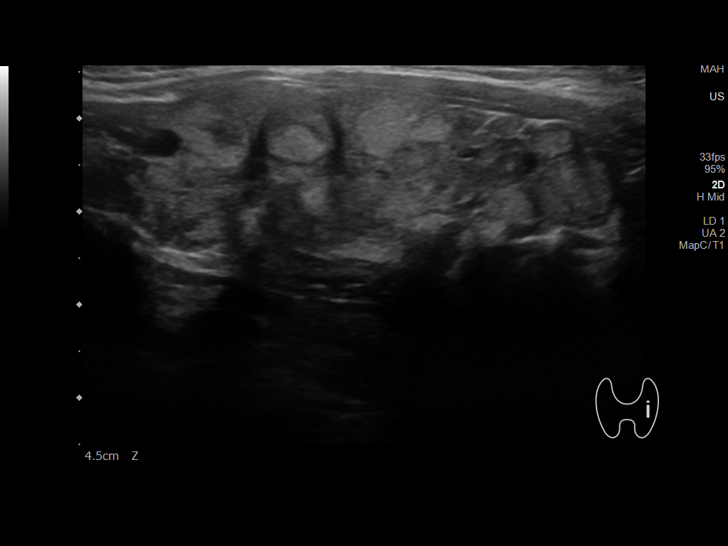
[im 22/33]
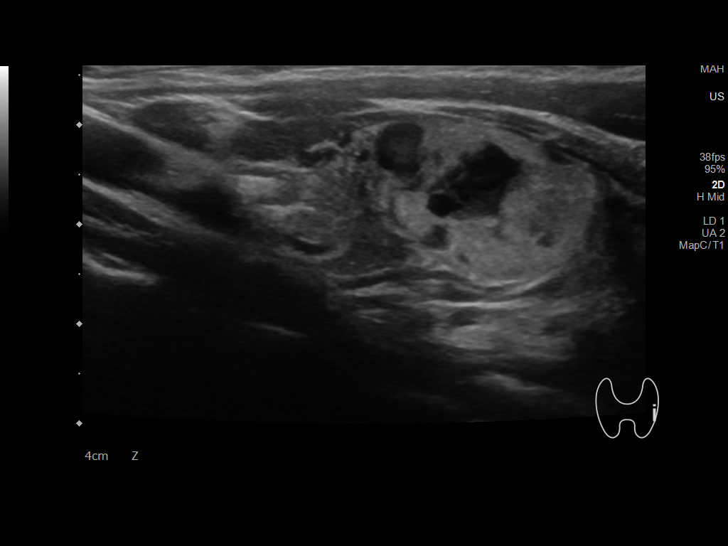
[im 25/33]
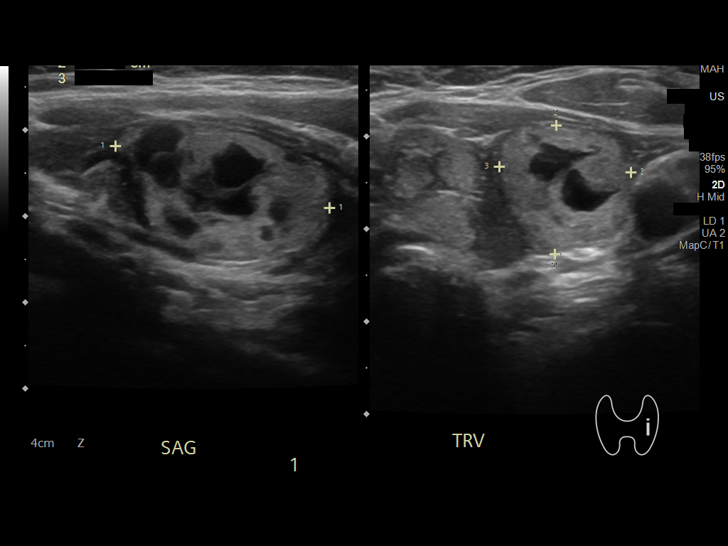
[im 27/33]
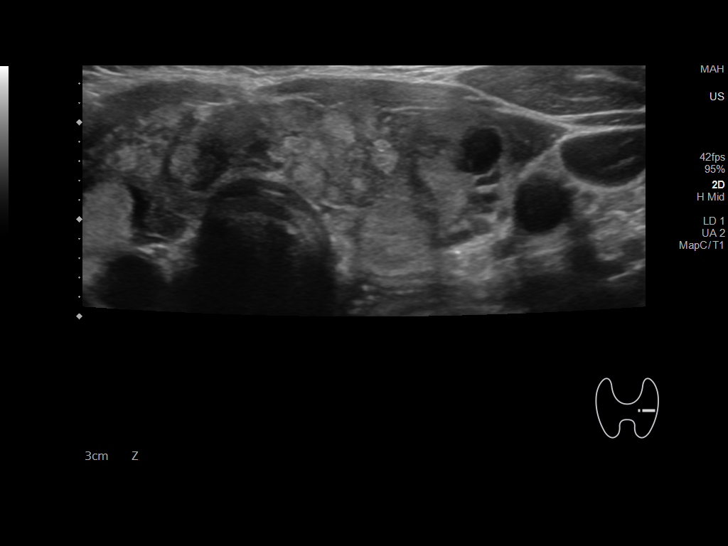
[im 30/33]
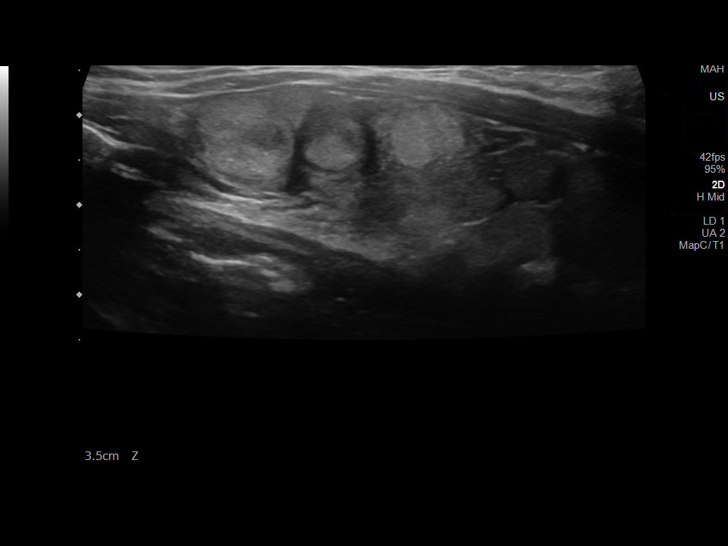
[im 33/33]
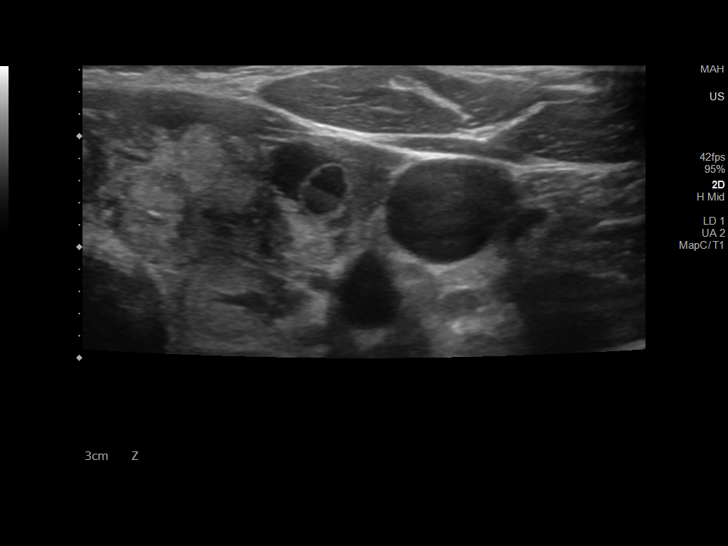

[13 of 25 positions shown; findings below may reference images not displayed]

FINDINGS: Parenchymal Echotexture: Moderately heterogeneous

Isthmus: 0.7 cm

Right lobe: 5.4 x 1.8 x 1.6 cm

Left lobe: 5.8 x 1.7 x 2.1 cm

_________________________________________________________

Estimated total number of nodules >/= 1 cm: 1

Number of spongiform nodules >/=  2 cm not described below (TR1): 0

Number of mixed cystic and solid nodules >/= 1.5 cm not described
below (TR2): 0

_________________________________________________________

Nodule # 1:

Prior biopsy: No

Location: Left; inferior

Maximum size: 2.6 cm; Other 2 dimensions: 1.4 x 1.4 cm, previously,
2.3 x 1.6 x 1.6 cm

Composition: solid/almost completely solid (2)

Echogenicity: isoechoic (1)

Shape: not taller-than-wide (0)

Margins: smooth (0)

Echogenic foci: none (0)

ACR TI-RADS total points: 3.

ACR TI-RADS risk category:  TR3 (3 points).

Significant change in size (>/= 20% in two dimensions and minimal
increase of 2 mm): No

Change in features: No

Change in ACR TI-RADS risk category: No

ACR TI-RADS recommendations:

**Given size (>/= 2.5 cm) and appearance, fine needle aspiration of
this mildly suspicious nodule should be considered based on TI-RADS
criteria.

_________________________________________________________
IMPRESSION: Nodule 1 (TI-RADS 3) located in the inferior left thyroid lobe does
not demonstrate significant growth since prior exam from 05/23/2020.
Annual ultrasound surveillance is recommended until 5 years of
stability is documented.

The above is in keeping with the ACR TI-RADS recommendations - [HOSPITAL] 7509;[DATE].

## 2023-01-26 ENCOUNTER — Encounter: Payer: BC Managed Care – PPO | Admitting: Internal Medicine

## 2023-02-16 ENCOUNTER — Ambulatory Visit: Payer: BC Managed Care – PPO | Admitting: Obstetrics & Gynecology

## 2023-03-01 ENCOUNTER — Ambulatory Visit (INDEPENDENT_AMBULATORY_CARE_PROVIDER_SITE_OTHER): Payer: BC Managed Care – PPO | Admitting: Internal Medicine

## 2023-03-01 ENCOUNTER — Encounter: Payer: Self-pay | Admitting: Internal Medicine

## 2023-03-01 VITALS — BP 120/68 | HR 76 | Temp 98.4°F | Resp 16 | Ht 61.0 in | Wt 157.5 lb

## 2023-03-01 DIAGNOSIS — D500 Iron deficiency anemia secondary to blood loss (chronic): Secondary | ICD-10-CM | POA: Diagnosis not present

## 2023-03-01 DIAGNOSIS — E039 Hypothyroidism, unspecified: Secondary | ICD-10-CM

## 2023-03-01 DIAGNOSIS — E559 Vitamin D deficiency, unspecified: Secondary | ICD-10-CM

## 2023-03-01 DIAGNOSIS — Z0000 Encounter for general adult medical examination without abnormal findings: Secondary | ICD-10-CM | POA: Diagnosis not present

## 2023-03-01 MED ORDER — FAMOTIDINE 40 MG PO TABS: 40.0000 mg | ORAL_TABLET | Freq: Every day | ORAL | 6 refills | Status: DC

## 2023-03-01 NOTE — Patient Instructions (Addendum)
Vaccines I recommend; Covid booster Flu shot every fall  Take your thyroid medication away from vitamin D, Biotene and iron supplements.   GO TO THE LAB : Get the blood work     GO TO THE FRONT DESK, PLEASE SCHEDULE YOUR APPOINTMENTS Come back for checkup in 6 months

## 2023-03-01 NOTE — Progress Notes (Unsigned)
Subjective:    Patient ID: Loretta [name], female    DOB: 1991-06-12, 31 y.o.   MRN: [id number]  DOS:  03/01/2023 Type of visit - description: cpx  Here for CPX. Doing well. Occasionally has heartburn without abdominal pain, nausea vomiting, change in the color of his stools. Occasional constipation (taking iron).   Review of Systems See above   Past Medical History:  Diagnosis Date   ACL tear 11/2014   left   Asthma 03/27/2012   Headache(784.0)    stress/migraine   History of cardiac murmur    "innocent murmur" 2011 - states has resolved   History of strabismus    Hypothyroidism    has not taken medication since 09/2014   Neurofibromatosis, type 1 (von Recklinghausen's disease) ([facility])    Seasonal asthma    prn inhaler    Past Surgical History:  Procedure Laterality Date   ANTERIOR CRUCIATE LIGAMENT REPAIR Left scheduled April 2016   Hamstring graft    STRABISMUS SURGERY     age 89   STRABISMUS SURGERY Right 03/01/2014   Procedure: REPAIR STRABISMUS BILATERAL;  Surgeon: Shara Blazing, MD;  Location: [facility];  Service: Ophthalmology;  Laterality: Right;   TUMOR EXCISION Right as an infant   lower eyelid    Current Outpatient Medications  Medication Instructions   albuterol (VENTOLIN HFA) 108 (90 Base) MCG/ACT inhaler 2 puffs, Inhalation, Every 6 hours PRN   Ascorbic Acid (VITAMIN C PO) 1 tablet, Oral, Daily   BIOTIN PO Oral   Iron 325 mg, Oral, 2 times daily before meals   levothyroxine (SYNTHROID) 112 mcg, Oral, Daily before breakfast       Objective:   Physical Exam BP 120/68   Pulse 76   Temp 98.4 F (36.9 C) (Oral)   Resp 16   Ht 5\' 1"  (1.549 m)   Wt 157 lb 8 oz (71.4 kg)   LMP 01/30/2023 (Exact Date)   SpO2 98%   BMI 29.76 kg/m  General: Well developed, NAD, BMI noted Neck: No  thyromegaly  HEENT:  Normocephalic . Face symmetric, atraumatic Lungs:  CTA B Normal respiratory effort, no intercostal retractions, no accessory  muscle use. Heart: RRR,  no murmur.  Abdomen:  Not distended, soft, non-tender. No rebound or rigidity.   Lower extremities: no pretibial edema bilaterally  Skin: Exposed areas without rash. Not pale. Not jaundice Neurologic:  alert & oriented X3.  Speech normal, gait appropriate for age and unassisted Strength symmetric and appropriate for age.  Psych: Cognition and judgment appear intact.  Cooperative with normal attention span and concentration.  Behavior appropriate. No anxious or depressed appearing.     Assessment     ASSESSMENT Hypothyroidism  Thyroid nodule  GERD H/o asthma , prn albuterol, seasonal  Neurofibromatoses  FH: First cousin dies form a brain aneurysm Vit  D def  Not on BCP    PLAN Here for CPX Tdap 2020 COVID VAX booster , flu shot (FALL) Female care: rec to see gyn, next July 2024, plans to proceed Diet and exercise: goes to the gym, eating healthier   Labs: BMP FLP CBC anemia panel TSH vitamin D  Hypothyroidism: On Synthroid, last TSH around 4, goal TSH around 1.0.  Checking labs, consider adjust medication.  Recommend to take it away from other supplements particularly Biotene. Thyroid nodule: Overdue for ultrasound states he cannot proceed at this time GERD: Has occasional heartburn, on no medications, start Pepcid at nighttime. History of asthma, rarely  uses albuterol. Vitamin D deficiency: Taking vitamin D inconsistently.  Checking labs Anemia: Menses are every month, heavy for 2 days, they last a total of 4 days, no GI symptoms Plan:  checking an anemia panel and encouraged to take iron supplements consistently. RTC 6 months     ==== Hypothyroidism: Good medication compliant, check TSH  Thyroid nodule: Was due for [place] 04-2022, she has not and will not be able to pursue it at this time. Vitamin D deficiency: Had ergocalciferol, take vitamin D "sometimes".  Encourage good compliance. Iron deficiency: Frequently forgets supplements, she knows  not to take it with Synthroid.  Recommend to take iron before lunch on an empty stomach.  No GI symptoms.  Checking levels. Asthma: RF albuterol, uses prn, this time of the year typically sxs increase a little  Skin lesion: new. Refer to dermatology RTC CPX [phone]

## 2023-03-02 LAB — CBC WITH DIFFERENTIAL/PLATELET
Basophils Absolute: 0 10*3/uL (ref 0.0–0.1)
Basophils Relative: 0.7 % (ref 0.0–3.0)
Eosinophils Absolute: 0.1 10*3/uL (ref 0.0–0.7)
Eosinophils Relative: 1.9 % (ref 0.0–5.0)
HCT: 36.3 % (ref 36.0–46.0)
Hemoglobin: 11.8 g/dL — ABNORMAL LOW (ref 12.0–15.0)
Lymphocytes Relative: 25.8 % (ref 12.0–46.0)
Lymphs Abs: 1.8 10*3/uL (ref 0.7–4.0)
MCHC: 32.5 g/dL (ref 30.0–36.0)
MCV: 82.2 fl (ref 78.0–100.0)
Monocytes Absolute: 0.6 10*3/uL (ref 0.1–1.0)
Monocytes Relative: 8.9 % (ref 3.0–12.0)
Neutro Abs: 4.4 10*3/uL (ref 1.4–7.7)
Neutrophils Relative %: 62.7 % (ref 43.0–77.0)
Platelets: 400 10*3/uL (ref 150.0–400.0)
RBC: 4.42 Mil/uL (ref 3.87–5.11)
RDW: 16 % — ABNORMAL HIGH (ref 11.5–15.5)
WBC: 7 10*3/uL (ref 4.0–10.5)

## 2023-03-02 LAB — BASIC METABOLIC PANEL
BUN: 13 mg/dL (ref 6–23)
CO2: 22 mEq/L (ref 19–32)
Calcium: 9.5 mg/dL (ref 8.4–10.5)
Chloride: 104 mEq/L (ref 96–112)
Creatinine, Ser: 0.89 mg/dL (ref 0.40–1.20)
GFR: 86.42 mL/min (ref >60.00)
Glucose, Bld: 92 mg/dL (ref 70–99)
Potassium: 4.3 mEq/L (ref 3.5–5.1)
Sodium: 139 mEq/L (ref 135–145)

## 2023-03-02 LAB — IBC + FERRITIN
Ferritin: 5.3 ng/mL — ABNORMAL LOW (ref 10.0–291.0)
Iron: 26 ug/dL — ABNORMAL LOW (ref 42–145)
Saturation Ratios: 6.2 % — ABNORMAL LOW (ref 20.0–50.0)
TIBC: 418.6 ug/dL (ref 250.0–450.0)
Transferrin: 299 mg/dL (ref 212.0–360.0)

## 2023-03-02 LAB — LIPID PANEL
Cholesterol: 156 mg/dL (ref 0–200)
HDL: 48.2 mg/dL (ref >39.00)
LDL Cholesterol: 87 mg/dL (ref 0–99)
NonHDL: 107.94
Total CHOL/HDL Ratio: 3
Triglycerides: 105 mg/dL (ref 0.0–149.0)
VLDL: 21 mg/dL (ref 0.0–40.0)

## 2023-03-02 LAB — TSH: TSH: 1.65 u[IU]/mL (ref 0.35–5.50)

## 2023-03-02 LAB — VITAMIN D 25 HYDROXY (VIT D DEFICIENCY, FRACTURES): VITD: 23.02 ng/mL — ABNORMAL LOW (ref 30.00–100.00)

## 2023-03-03 ENCOUNTER — Encounter: Payer: Self-pay | Admitting: Internal Medicine

## 2023-03-03 NOTE — Assessment & Plan Note (Signed)
Here for CPX Hypothyroidism: On Synthroid, last TSH around 4, goal TSH around 1.0.  Checking labs, consider adjust medication.  Recommend to take it away from other supplements particularly Biotene. Thyroid nodule: Overdue for a thyroid US,states cannot proceed at this time GERD: Has occasional heartburn, on no medications, start Pepcid at nighttime. History of asthma, rarely uses albuterol. Vitamin D deficiency: Taking vitamin D inconsistently.  Checking labs Anemia: Menses are every month, heavy for 2 days, they last a total of 4 days, no GI symptoms Plan:  checking an anemia panel and encouraged to take iron supplements consistently. RTC 6 months

## 2023-03-03 NOTE — Assessment & Plan Note (Addendum)
Tdap 2020 Vax I rec: COVID VAX booster , flu shot (FALL) Female care: plans to see gyn July 2024 Diet and exercise: goes to the gym, eating healthier   Labs: BMP FLP CBC anemia panel TSH vitamin D

## 2023-03-06 ENCOUNTER — Other Ambulatory Visit: Payer: Self-pay | Admitting: Internal Medicine

## 2023-03-06 MED ORDER — FERROUS GLUCONATE 324 (37.5 FE) MG PO TABS: 1.0000 | ORAL_TABLET | ORAL | 9 refills | Status: DC | PRN

## 2023-03-07 MED ORDER — VITAMIN D (ERGOCALCIFEROL) 1.25 MG (50000 UNIT) PO CAPS: 50000.0000 [IU] | ORAL_CAPSULE | ORAL | 0 refills | Status: AC

## 2023-03-07 NOTE — Addendum Note (Signed)
Addended byConrad [place] D on: 03/07/2023 07:53 AM   Modules accepted: Orders

## 2023-03-14 ENCOUNTER — Other Ambulatory Visit: Payer: Self-pay | Admitting: Internal Medicine

## 2023-04-11 ENCOUNTER — Ambulatory Visit (INDEPENDENT_AMBULATORY_CARE_PROVIDER_SITE_OTHER): Payer: BC Managed Care – PPO | Admitting: Obstetrics & Gynecology

## 2023-04-11 ENCOUNTER — Encounter: Payer: Self-pay | Admitting: Obstetrics & Gynecology

## 2023-04-11 ENCOUNTER — Other Ambulatory Visit (HOSPITAL_COMMUNITY)
Admission: RE | Admit: 2023-04-11 | Discharge: 2023-04-11 | Disposition: A | Discharge status: Home / Self Care | Payer: BC Managed Care – PPO | Source: Ambulatory Visit | Attending: Obstetrics & Gynecology | Admitting: Obstetrics & Gynecology

## 2023-04-11 VITALS — BP 120/78 | HR 93 | Ht 61.0 in | Wt 163.0 lb

## 2023-04-11 DIAGNOSIS — R8290 Unspecified abnormal findings in urine: Secondary | ICD-10-CM | POA: Diagnosis not present

## 2023-04-11 DIAGNOSIS — Z01419 Encounter for gynecological examination (general) (routine) without abnormal findings: Secondary | ICD-10-CM

## 2023-04-11 DIAGNOSIS — Z308 Encounter for other contraceptive management: Secondary | ICD-10-CM

## 2023-04-11 LAB — URINALYSIS, COMPLETE W/RFL CULTURE
Bacteria, UA: NONE SEEN /HPF
Bilirubin Urine: NEGATIVE
Glucose, UA: NEGATIVE
Hyaline Cast: NONE SEEN /LPF
Ketones, ur: NEGATIVE
Leukocyte Esterase: NEGATIVE
Nitrites, Initial: NEGATIVE
Protein, ur: NEGATIVE
RBC / HPF: NONE SEEN /HPF (ref 0–2)
Specific Gravity, Urine: 1.015 (ref 1.001–1.035)
WBC, UA: NONE SEEN /HPF (ref 0–5)
pH: 6 (ref 5.0–8.0)

## 2023-04-11 LAB — NO CULTURE INDICATED

## 2023-04-11 NOTE — Progress Notes (Signed)
Loretta [name] 1991/08/14 [id number]   History:    32 y.o.  G0  Single/Virgin.  Lives with parents.  Works in [employer].   RP:  Established patient presenting for annual gyn exam    HPI: Normal menstrual periods every month with normal flow.  No breakthrough bleeding.  Hypothyroidism on Synthroid. No pelvic pain. Normal vaginal secretions.  Virgin.  Pap Negative in 09/2017.  Pap reflex today.  C/O Urine odor.  Bowel movements normal. Breasts normal. Body mass index 30.8.  Exercises on a regular basis. Health labs with family physician Dr. Drue Novel.    Past medical history,surgical history, family history and social history were all reviewed and documented in the EPIC chart.  Gynecologic History Patient's last menstrual period was 03/31/2023 (exact date).  Obstetric History OB History  Gravida Para Term Preterm AB Living  0            SAB IAB Ectopic Multiple Live Births                ROS: A ROS was performed and pertinent positives and negatives are included in the history. GENERAL: No fevers or chills. HEENT: No change in vision, no earache, sore throat or sinus congestion. NECK: No pain or stiffness. CARDIOVASCULAR: No chest pain or pressure. No palpitations. PULMONARY: No shortness of breath, cough or wheeze. GASTROINTESTINAL: No abdominal pain, nausea, vomiting or diarrhea, melena or bright red blood per rectum. GENITOURINARY: No urinary frequency, urgency, hesitancy or dysuria. MUSCULOSKELETAL: No joint or muscle pain, no back pain, no recent trauma. DERMATOLOGIC: No rash, no itching, no lesions. ENDOCRINE: No polyuria, polydipsia, no heat or cold intolerance. No recent change in weight. HEMATOLOGICAL: No anemia or easy bruising or bleeding. NEUROLOGIC: No headache, seizures, numbness, tingling or weakness. PSYCHIATRIC: No depression, no loss of interest in normal activity or change in sleep pattern.     Exam:   BP 120/78   Pulse 93   Ht 5\' 1"  (1.549 m)   Wt 163 lb (73.9  kg)   LMP 03/31/2023 (Exact Date) Comment: never sexually active  SpO2 98%   BMI 30.80 kg/m   Body mass index is 30.8 kg/m.  General appearance : Well developed well nourished female. No acute distress HEENT: Eyes: no retinal hemorrhage or exudates,  Neck supple, trachea midline, no carotid bruits, no thyroidmegaly Lungs: Clear to auscultation, no rhonchi or wheezes, or rib retractions  Heart: Regular rate and rhythm, no murmurs or gallops Breast:Examined in sitting and supine position were symmetrical in appearance, no palpable masses or tenderness,  no skin retraction, no nipple inversion, no nipple discharge, no skin discoloration, no axillary or supraclavicular lymphadenopathy Abdomen: no palpable masses or tenderness, no rebound or guarding Extremities: no edema or skin discoloration or tenderness  Pelvic: Vulva: Normal             Vagina: No gross lesions or discharge  Cervix: No gross lesions or discharge.  Virgin speculum used.  Pap reflex done.  Uterus  AV, normal size, shape and consistency, non-tender and mobile  Adnexa  Without masses or tenderness  Anus: Normal  U/A: Completely Negative   Assessment/Plan:  32 y.o. female for annual exam   1. Encounter for routine gynecological examination with Papanicolaou smear of cervix Normal menstrual periods every month with normal flow.  No breakthrough bleeding.  Hypothyroidism on Synthroid. No pelvic pain. Normal vaginal secretions.  Virgin.  Pap Negative in 09/2017.  Pap reflex today.  C/O Urine odor.  Bowel movements  normal. Breasts normal. Body mass index 30.8.  Exercises on a regular basis. Health labs with family physician Dr[facility] Drue Novel. [facility]  2. Encounter for other contraceptive management Virgin.  3. Abnormal urine odor U/A completely Negative.  Reassured. - Urinalysis,Complete w/RFL Culture   [name] MD, 12:19 PM

## 2023-04-13 LAB — CYTOLOGY - PAP: Diagnosis: NEGATIVE

## 2023-07-02 DIAGNOSIS — M9905 Segmental and somatic dysfunction of pelvic region: Secondary | ICD-10-CM | POA: Diagnosis not present

## 2023-07-02 DIAGNOSIS — M9903 Segmental and somatic dysfunction of lumbar region: Secondary | ICD-10-CM | POA: Diagnosis not present

## 2023-07-02 DIAGNOSIS — M9902 Segmental and somatic dysfunction of thoracic region: Secondary | ICD-10-CM | POA: Diagnosis not present

## 2023-07-02 DIAGNOSIS — M9901 Segmental and somatic dysfunction of cervical region: Secondary | ICD-10-CM | POA: Diagnosis not present

## 2023-07-30 DIAGNOSIS — M9902 Segmental and somatic dysfunction of thoracic region: Secondary | ICD-10-CM | POA: Diagnosis not present

## 2023-07-30 DIAGNOSIS — M9905 Segmental and somatic dysfunction of pelvic region: Secondary | ICD-10-CM | POA: Diagnosis not present

## 2023-07-30 DIAGNOSIS — M9901 Segmental and somatic dysfunction of cervical region: Secondary | ICD-10-CM | POA: Diagnosis not present

## 2023-07-30 DIAGNOSIS — M9903 Segmental and somatic dysfunction of lumbar region: Secondary | ICD-10-CM | POA: Diagnosis not present

## 2023-07-30 DIAGNOSIS — M9906 Segmental and somatic dysfunction of lower extremity: Secondary | ICD-10-CM | POA: Diagnosis not present

## 2023-07-30 DIAGNOSIS — M5431 Sciatica, right side: Secondary | ICD-10-CM | POA: Diagnosis not present

## 2023-08-02 ENCOUNTER — Telehealth: Payer: Self-pay | Admitting: Internal Medicine

## 2023-08-02 NOTE — Telephone Encounter (Signed)
Okay with me, thank you 

## 2023-08-02 NOTE — Telephone Encounter (Signed)
Daughter call and stated she want to change to dr [place] .

## 2023-08-03 NOTE — Telephone Encounter (Signed)
Fine with me, BJ

## 2023-08-31 ENCOUNTER — Ambulatory Visit: Payer: BC Managed Care – PPO | Admitting: Internal Medicine

## 2023-09-10 DIAGNOSIS — M9902 Segmental and somatic dysfunction of thoracic region: Secondary | ICD-10-CM | POA: Diagnosis not present

## 2023-09-10 DIAGNOSIS — M9905 Segmental and somatic dysfunction of pelvic region: Secondary | ICD-10-CM | POA: Diagnosis not present

## 2023-09-10 DIAGNOSIS — M9906 Segmental and somatic dysfunction of lower extremity: Secondary | ICD-10-CM | POA: Diagnosis not present

## 2023-09-10 DIAGNOSIS — M9903 Segmental and somatic dysfunction of lumbar region: Secondary | ICD-10-CM | POA: Diagnosis not present

## 2023-09-10 DIAGNOSIS — M5431 Sciatica, right side: Secondary | ICD-10-CM | POA: Diagnosis not present

## 2023-10-17 ENCOUNTER — Ambulatory Visit: Payer: BC Managed Care – PPO | Admitting: Family Medicine

## 2023-10-18 ENCOUNTER — Ambulatory Visit: Payer: Self-pay | Admitting: Family Medicine

## 2023-10-18 ENCOUNTER — Encounter: Payer: Self-pay | Admitting: Family Medicine

## 2023-10-18 VITALS — BP 126/70 | HR 90 | Resp 12 | Ht 61.0 in | Wt 171.0 lb

## 2023-10-18 DIAGNOSIS — J4520 Mild intermittent asthma, uncomplicated: Secondary | ICD-10-CM

## 2023-10-18 DIAGNOSIS — D500 Iron deficiency anemia secondary to blood loss (chronic): Secondary | ICD-10-CM | POA: Diagnosis not present

## 2023-10-18 DIAGNOSIS — D509 Iron deficiency anemia, unspecified: Secondary | ICD-10-CM | POA: Insufficient documentation

## 2023-10-18 DIAGNOSIS — E039 Hypothyroidism, unspecified: Secondary | ICD-10-CM

## 2023-10-18 DIAGNOSIS — E559 Vitamin D deficiency, unspecified: Secondary | ICD-10-CM | POA: Diagnosis not present

## 2023-10-18 DIAGNOSIS — Q8501 Neurofibromatosis, type 1: Secondary | ICD-10-CM

## 2023-10-18 DIAGNOSIS — E041 Nontoxic single thyroid nodule: Secondary | ICD-10-CM

## 2023-10-18 DIAGNOSIS — Z13220 Encounter for screening for lipoid disorders: Secondary | ICD-10-CM

## 2023-10-18 DIAGNOSIS — M25531 Pain in right wrist: Secondary | ICD-10-CM

## 2023-10-18 NOTE — Patient Instructions (Signed)
A few things to remember from today's visit:  Mild intermittent asthma, unspecified whether complicated  Vitamin D deficiency, unspecified  Acquired hypothyroidism  Iron deficiency anemia due to chronic blood loss  No changes today. Labs in a few days.  If you need refills for medications you take chronically, please call your pharmacy. Do not use My Chart to request refills or for acute issues that need immediate attention. If you send a my chart message, it may take a few days to be addressed, specially if I am not in the office.  Please be sure medication list is accurate. If a new problem present, please set up appointment sooner than planned today.

## 2023-10-18 NOTE — Progress Notes (Signed)
HPI: Loretta [name] is a 33 y.o. female with a PMHx significant for asthma, GERD, hypothyroidism, von Recklinghausen's disease, iron deficiency anemia, and vitamin D deficiency, among some, who is here today to establish care.   Former PCP: Wanda Plump, MD Last preventive routine visit: 03/01/2023  Exercise: Patient states she does HIIT workouts 3-5 times per week.  Diet: She says her diet is "so-so." Sleep: 7-8 hours per night Alcohol Use: occasional social use Smoking: never Vision: UTD on routine vision care.  Dental: UTD on routine dental care.   Chronic medical problems:   Hypothyroidism: dx'ed ~ 10 years ago Currently on levothyroxine 112 mcg daily.  She was not told if she had Hashimoto's.  Lab Results  Component Value Date   TSH 1.65 03/01/2023   Asthma:  Mostly occurs in the spring and sometimes during winter.  Has some albuterol left, but doesn't believe her insurance covers it anymore.   Iron Deficiency Anemia:  She used to take iron supplements but has not been taking them lately because it is difficult to take it with the thyroid medication. She says her menstrual periods are not heavy.   Concerns today:   Patient mentions she hurt her right wrist while working out in 07/2023 and has had Ulnar right wrist pain since then.  She is left handed.   Review of Systems See other pertinent positives and negatives in HPI.  Current Outpatient Medications on File Prior to Visit  Medication Sig Dispense Refill   albuterol (VENTOLIN HFA) 108 (90 Base) MCG/ACT inhaler Inhale 2 puffs into the lungs every 6 (six) hours as needed for wheezing or shortness of breath. 18 g 5   Ascorbic Acid (VITAMIN C PO) Take 1 tablet by mouth.     BIOTIN PO Take by mouth.     famotidine (PEPCID) 40 MG tablet Take 1 tablet (40 mg total) by mouth at bedtime. (Patient not taking: Reported on 04/11/2023) 30 tablet 6   Ferrous Gluconate 324 (37.5 Fe) MG TABS Take 1 tablet (324 mg total) by  mouth every other day as needed. 30 tablet 9   levothyroxine (SYNTHROID) 112 MCG tablet Take 1 tablet (112 mcg total) by mouth daily before breakfast. 90 tablet 1   No current facility-administered medications on file prior to visit.    Past Medical History:  Diagnosis Date   ACL tear 11/2014   left   Asthma 03/27/2012   Headache(784.0)    stress/migraine   History of cardiac murmur    "innocent murmur" 2011 - states has resolved   History of strabismus    Hypothyroidism    has not taken medication since 09/2014   Neurofibromatosis, type 1 (von Recklinghausen's disease) (HCC)    Seasonal asthma    prn inhaler   No Known Allergies  Family History  Problem Relation Age of Onset   Stomach cancer Father 24   Diabetes Maternal Grandmother     Social History   Socioeconomic History   Marital status: Single    Spouse name: Not on file   Number of children: 0   Years of education: College   Highest education level: Not on file  Occupational History   Occupation: Architect, works full time  Tobacco Use   Smoking status: Never   Smokeless tobacco: Never  Vaping Use   Vaping status: Never Used  Substance and Sexual Activity   Alcohol use: Yes    Comment: social   Drug use: No   Sexual  activity: Never    Birth control/protection: Abstinence    Comment: VIRGIN  Other Topics Concern   Not on file  Social History Narrative   G0P0   Patient lives at home with her parents.    Left handed.    Nancy Fetter 01-2014   Social Drivers of Health   Financial Resource Strain: Not on file  Food Insecurity: Not on file  Transportation Needs: Not on file  Physical Activity: Not on file  Stress: Not on file  Social Connections: Unknown (02/08/2022)   Received from [facility]   Social Network    Social Network: Not on file    There were no vitals filed for this visit.  There is no height or weight on file to calculate BMI.  Physical Exam Vitals  and nursing note reviewed.  Constitutional:      General: She is not in acute distress.    Appearance: She is well-developed.  HENT:     Head: Normocephalic and atraumatic.     Mouth/Throat:     Mouth: Mucous membranes are moist.     Pharynx: Oropharynx is clear.  Eyes:     Conjunctiva/sclera: Conjunctivae normal.  Neck:     Thyroid: Thyromegaly present.  Cardiovascular:     Rate and Rhythm: Normal rate and regular rhythm.     Heart sounds: No murmur heard. Pulmonary:     Effort: Pulmonary effort is normal. No respiratory distress.     Breath sounds: Normal breath sounds.  Abdominal:     Palpations: Abdomen is soft. There is no hepatomegaly or mass.     Tenderness: There is no abdominal tenderness.  Musculoskeletal:     Right wrist: Tenderness present.     Comments: ***  Lymphadenopathy:     Cervical: No cervical adenopathy.  Skin:    General: Skin is warm.     Findings: No erythema or rash.  Neurological:     General: No focal deficit present.     Mental Status: She is alert and oriented to person, place, and time.     Cranial Nerves: No cranial nerve deficit.     Gait: Gait normal.  Psychiatric:     Comments: Well groomed, good eye contact.     ASSESSMENT AND PLAN:  Ms. Menon was seen today to establish care.   There are no diagnoses linked to this encounter.  No follow-ups on file.  I, Rolla Etienne Wierda, acting as a scribe for Khalila Buechner [place], MD., have documented all relevant documentation on the behalf of Flem Enderle [place], MD, as directed by  Armanii Urbanik [place], MD while in the presence of Zayah Keilman [place], MD.   I, Kathleene Bergemann [place], MD, have reviewed all documentation for this visit. The documentation on 10/18/23 for the exam, diagnosis, procedures, and orders are all accurate and complete.  Zackarey Holleman G. [place], MD  [facility]. [place] office.

## 2023-10-20 MED ORDER — LEVOTHYROXINE SODIUM 112 MCG PO TABS: 112.0000 ug | ORAL_TABLET | Freq: Every day | ORAL | 1 refills | Status: DC

## 2023-10-20 NOTE — Assessment & Plan Note (Signed)
Stable left-sided thyroid nodule. Last thyroid US 04/2021 and previously 2021. It was recommended to have annual thyroid US until completing 5 years of stability. Thyroid US order placed.

## 2023-10-20 NOTE — Assessment & Plan Note (Signed)
Most likely related to menses. She is not longer on iron supplementation. Further recommendations according to lab result.

## 2023-10-20 NOTE — Assessment & Plan Note (Signed)
Problem has been adequately controlled. Currently on Levothyroxine 112 mcg daily. Last TSH 1.6 in 02/2023. TSH will be added to next labs and it can be followed annually as far as it is stable.

## 2023-10-20 NOTE — Assessment & Plan Note (Signed)
Mild. Has had neurofibromas removed and has a few scattered hyperpigmented macular lesions.

## 2023-10-20 NOTE — Assessment & Plan Note (Signed)
Problem is stable. She is currently on Albuterol inh, which she reports is not longer covered by her new health insurance. Instructed to find out which generic form is covered. For now continue Albuterol inh 1-2 puff qid prn.

## 2023-10-20 NOTE — Assessment & Plan Note (Signed)
Has not been consistent with taking vit D supplementation. Further recommendations according to 25 OH vit D result.

## 2023-10-24 ENCOUNTER — Other Ambulatory Visit (INDEPENDENT_AMBULATORY_CARE_PROVIDER_SITE_OTHER): Payer: BC Managed Care – PPO

## 2023-10-24 DIAGNOSIS — D500 Iron deficiency anemia secondary to blood loss (chronic): Secondary | ICD-10-CM

## 2023-10-24 DIAGNOSIS — E039 Hypothyroidism, unspecified: Secondary | ICD-10-CM

## 2023-10-24 DIAGNOSIS — Z13220 Encounter for screening for lipoid disorders: Secondary | ICD-10-CM

## 2023-10-24 DIAGNOSIS — E559 Vitamin D deficiency, unspecified: Secondary | ICD-10-CM

## 2023-10-24 LAB — IRON: Iron: 75 ug/dL (ref 42–145)

## 2023-10-24 LAB — BASIC METABOLIC PANEL
BUN: 14 mg/dL (ref 6–23)
CO2: 26 meq/L (ref 19–32)
Calcium: 9.4 mg/dL (ref 8.4–10.5)
Chloride: 105 meq/L (ref 96–112)
Creatinine, Ser: 0.79 mg/dL (ref 0.40–1.20)
GFR: 99.25 mL/min (ref >60.00)
Glucose, Bld: 91 mg/dL (ref 70–99)
Potassium: 4 meq/L (ref 3.5–5.1)
Sodium: 138 meq/L (ref 135–145)

## 2023-10-24 LAB — CBC
HCT: 36.2 % (ref 36.0–46.0)
Hemoglobin: 12 g/dL (ref 12.0–15.0)
MCHC: 33 g/dL (ref 30.0–36.0)
MCV: 84.2 fL (ref 78.0–100.0)
Platelets: 376 10*3/uL (ref 150.0–400.0)
RBC: 4.3 Mil/uL (ref 3.87–5.11)
RDW: 14.1 % (ref 11.5–15.5)
WBC: 6.4 10*3/uL (ref 4.0–10.5)

## 2023-10-24 LAB — LIPID PANEL
Cholesterol: 173 mg/dL (ref 0–200)
HDL: 55.5 mg/dL (ref >39.00)
LDL Cholesterol: 99 mg/dL (ref 0–99)
NonHDL: 117.67
Total CHOL/HDL Ratio: 3
Triglycerides: 91 mg/dL (ref 0.0–149.0)
VLDL: 18.2 mg/dL (ref 0.0–40.0)

## 2023-10-24 LAB — VITAMIN D 25 HYDROXY (VIT D DEFICIENCY, FRACTURES): VITD: 15.31 ng/mL — ABNORMAL LOW (ref 30.00–100.00)

## 2023-10-24 LAB — TSH: TSH: 4.87 u[IU]/mL (ref 0.35–5.50)

## 2023-10-24 LAB — FERRITIN: Ferritin: 5.8 ng/mL — ABNORMAL LOW (ref 10.0–291.0)

## 2023-10-24 LAB — T4, FREE: Free T4: 1 ng/dL (ref 0.60–1.60)

## 2023-10-31 ENCOUNTER — Other Ambulatory Visit: Payer: Self-pay | Admitting: Family Medicine

## 2023-10-31 ENCOUNTER — Ambulatory Visit: Payer: Self-pay | Admitting: Family Medicine

## 2023-10-31 ENCOUNTER — Encounter: Payer: Self-pay | Admitting: Family Medicine

## 2023-10-31 DIAGNOSIS — E559 Vitamin D deficiency, unspecified: Secondary | ICD-10-CM

## 2023-10-31 MED ORDER — VITAMIN D (ERGOCALCIFEROL) 1.25 MG (50000 UNIT) PO CAPS: 50000.0000 [IU] | ORAL_CAPSULE | ORAL | 0 refills | Status: AC

## 2023-10-31 NOTE — Telephone Encounter (Signed)
 Noted

## 2023-10-31 NOTE — Telephone Encounter (Signed)
Chief Complaint: Swollen ankles Symptoms: Bilateral swollen ankles, itchy skin all over  Frequency: Onset Friday Pertinent Negatives: Patient denies fever, nausea, vomiting, chest pain  Disposition: [] ED /[] Urgent Care (no appt availability in office) / [x] Appointment(In office/virtual)/ []  [facility]/ [] Home Care/ [] Refused Recommended Disposition /[] [place] [facility]/ []  Follow-up with PCP Additional Notes: Patient states she was on Vacation in [place] over the weekend and twisted both ankles while walking in the sand. Patient states she is able to walk on them but the ankles are swollen and painful now. Patient states she also has itching all over her body and that happened to her the last time she went out of the country. Patient currently at work with feel elevated. Care advice was given and patient has been scheduled with PCP tomorrow afternoon. Advised patient to callback if symptoms get worse or go to urgent care. Patient verbalized understanding.   Copied from CRM [id number]. Topic: Clinical - Red Word Triage >> Oct 31, 2023 10:14 AM [place] wrote: Red Word that prompted transfer to Nurse Triage: twisted ankles, both feet are swollen but the right is more swollen, bruising on both. Shins and feet and swollen. Patient is itching all over her body. Patient was in [place] over the weekend. Reason for Disposition  SEVERE swelling (e.g., can't move swollen ankle at all)  Answer Assessment - Initial Assessment Questions 1. LOCATION: "Which ankle is swollen?" "Where is the swelling?"     Both 2. ONSET: "When did the swelling start?"     Friday  3. SWELLING: "How bad is the swelling?" Or, "How large is it?" (e.g., mild, moderate, severe; size of localized swelling)    - NONE: No joint swelling.   - LOCALIZED: Localized; small area of puffy or swollen skin (e.g., insect bite, skin irritation).   - MILD: Joint looks or feels mildly swollen or puffy.   - MODERATE: Swollen;  interferes with normal activities (e.g., work or school); decreased range of movement; may be limping.   - SEVERE: Very swollen; can't move swollen joint at all; limping a lot or unable to walk.     Moderate 4. PAIN: "Is there any pain?" If Yes, ask: "How bad is it?" (Scale 1-10; or mild, moderate, severe)   - NONE (0): no pain.   - MILD (1-3): doesn't interfere with normal activities.    - MODERATE (4-7): interferes with normal activities (e.g., work or school) or awakens from sleep, limping.    - SEVERE (8-10): excruciating pain, unable to do any normal activities, unable to walk.      Mild to Moderate  5. CAUSE: "What do you think caused the ankle swelling?"     I twisted them in sand on vacation  6. OTHER SYMPTOMS: "Do you have any other symptoms?" (e.g., fever, chest pain, difficulty breathing, calf pain)     Itchy rash all over the body  Protocols used: Ankle Swelling-A-AH

## 2023-11-01 ENCOUNTER — Encounter: Payer: Self-pay | Admitting: Family Medicine

## 2023-11-01 ENCOUNTER — Ambulatory Visit: Payer: BC Managed Care – PPO | Admitting: Family Medicine

## 2023-11-01 VITALS — BP 122/76 | HR 64 | Resp 12 | Ht 61.0 in | Wt 172.0 lb

## 2023-11-01 DIAGNOSIS — S93401A Sprain of unspecified ligament of right ankle, initial encounter: Secondary | ICD-10-CM

## 2023-11-01 DIAGNOSIS — R58 Hemorrhage, not elsewhere classified: Secondary | ICD-10-CM

## 2023-11-01 DIAGNOSIS — L282 Other prurigo: Secondary | ICD-10-CM

## 2023-11-01 LAB — CBC WITH DIFFERENTIAL/PLATELET
Basophils Absolute: 0.1 10*3/uL (ref 0.0–0.1)
Basophils Relative: 0.8 % (ref 0.0–3.0)
Eosinophils Absolute: 0.1 10*3/uL (ref 0.0–0.7)
Eosinophils Relative: 2 % (ref 0.0–5.0)
HCT: 36.9 % (ref 36.0–46.0)
Hemoglobin: 12.1 g/dL (ref 12.0–15.0)
Lymphocytes Relative: 23.7 % (ref 12.0–46.0)
Lymphs Abs: 1.6 10*3/uL (ref 0.7–4.0)
MCHC: 32.7 g/dL (ref 30.0–36.0)
MCV: 84.4 fL (ref 78.0–100.0)
Monocytes Absolute: 0.5 10*3/uL (ref 0.1–1.0)
Monocytes Relative: 7.6 % (ref 3.0–12.0)
Neutro Abs: 4.6 10*3/uL (ref 1.4–7.7)
Neutrophils Relative %: 65.9 % (ref 43.0–77.0)
Platelets: 387 10*3/uL (ref 150.0–400.0)
RBC: 4.37 Mil/uL (ref 3.87–5.11)
RDW: 14.5 % (ref 11.5–15.5)
WBC: 7 10*3/uL (ref 4.0–10.5)

## 2023-11-01 LAB — PROTIME-INR
INR: 1.2 {ratio} — ABNORMAL HIGH (ref 0.8–1.0)
Prothrombin Time: 12.3 s (ref 9.6–13.1)

## 2023-11-01 MED ORDER — TRIAMCINOLONE ACETONIDE 0.5 % EX OINT: 1.0000 | TOPICAL_OINTMENT | Freq: Two times a day (BID) | CUTANEOUS | 0 refills | Status: AC

## 2023-11-01 MED ORDER — METHYLPREDNISOLONE ACETATE 40 MG/ML IJ SUSP
40.0000 mg | Freq: Once | INTRAMUSCULAR | Status: AC
  Administered 2023-11-01: 40 mg via INTRAMUSCULAR

## 2023-11-01 NOTE — Progress Notes (Signed)
 ACUTE VISIT Chief Complaint  Patient presents with   Joint Swelling    Both ankles   HPI: [name] is a 33 y.o. female with a PMHx significant for asthma, GERD, hypothyroidism, von Recklinghausen's disease, iron  deficiency anemia, and vitamin D  deficiency, among some, who is here today complaining of ankle swelling after an injury and pruritic rash.   Patient states she twisted both of her ankles in separate incidents on 1/31 while she was in [place]. She is having significant swelling but no pain.  She says the right ankle has more swelling than the left.  She took some tylenol  and motrin.  She was on a plane flying home for much of the day on 2/2.  She mentions she has twisted her ankles several times in the past.  Inquires about ortho evaluation.  Patient also complains that she was having skin pruritus on her thighs while flying home. Also noticed some bumps on her skin.  Rash This is a new problem. The rash is diffuse. She was exposed to nothing. Pertinent negatives include no congestion, cough, diarrhea, facial edema, fever, joint pain, nail changes, rhinorrhea, shortness of breath or sore throat. Past treatments include antihistamine. The treatment provided mild relief. Her past medical history is significant for asthma.   After scratching, she noted bruises on her thighs.  No history of eczema.  No known mosquito bites, outdoor exposure, or new medication. She took some benadryl for the rash.  Denies nausea, vomiting, or abdominal pain.   Review of Systems  Constitutional:  Negative for activity change, appetite change and fever.  HENT:  Negative for congestion, rhinorrhea and sore throat.   Eyes:  Negative for discharge and redness.  Respiratory:  Negative for cough and shortness of breath.   Gastrointestinal:  Negative for abdominal pain and diarrhea.  Genitourinary:  Negative for dysuria and hematuria.  Musculoskeletal:  Negative for gait problem, joint  pain and myalgias.  Skin:  Positive for rash. Negative for nail changes.  Neurological:  Negative for weakness and numbness.  See other pertinent positives and negatives in HPI.  Current Outpatient Medications on File Prior to Visit  Medication Sig Dispense Refill   levothyroxine  (SYNTHROID ) 112 MCG tablet Take 1 tablet (112 mcg total) by mouth daily before breakfast. 90 tablet 1   Vitamin D , Ergocalciferol , (DRISDOL ) 1.25 MG (50000 UNIT) CAPS capsule Take 1 capsule (50,000 Units total) by mouth every 7 (seven) days for 12 doses. 12 capsule 0   No current facility-administered medications on file prior to visit.   Past Medical History:  Diagnosis Date   ACL tear 11/2014   left   Asthma 03/27/2012   Headache(784.0)    stress/migraine   History of cardiac murmur    innocent murmur 2011 - states has resolved   History of strabismus    Hypothyroidism    has not taken medication since 09/2014   Neurofibromatosis, type 1 (von Recklinghausen's disease) (HCC)    Seasonal asthma    prn inhaler   No Known Allergies  Social History   Socioeconomic History   Marital status: Single    Spouse name: Not on file   Number of children: 0   Years of education: College   Highest education level: Not on file  Occupational History   Occupation: recreational therapist, works full time  Tobacco Use   Smoking status: Never   Smokeless tobacco: Never  Vaping Use   Vaping status: Never Used  Substance and Sexual Activity  Alcohol use: Yes    Comment: social   Drug use: No   Sexual activity: Never    Birth control/protection: Abstinence    Comment: VIRGIN  Other Topics Concern   Not on file  Social History Narrative   G0P0   Patient lives at home with her parents.    Left handed.    [name] 01-2014   Social Drivers of Corporate Investment Banker Strain: Not on file  Food Insecurity: Not on file  Transportation Needs: Not on file  Physical Activity: Not on file  Stress: Not on  file  Social Connections: Unknown (02/08/2022)   Received from [facility]   Social Network    Social Network: Not on file    Vitals:   11/01/23 1304  BP: 122/76  Pulse: 64  Resp: 12  SpO2: 99%   Body mass index is 32.5 kg/m.  Physical Exam Vitals and nursing note reviewed.  Constitutional:      General: She is not in acute distress.    Appearance: She is well-developed.  HENT:     Head: Normocephalic and atraumatic.  Eyes:     Conjunctiva/sclera: Conjunctivae normal.  Cardiovascular:     Rate and Rhythm: Normal rate and regular rhythm.     Pulses:          Dorsalis pedis pulses are 2+ on the right side and 2+ on the left side.     Heart sounds: No murmur heard. Pulmonary:     Effort: Pulmonary effort is normal. No respiratory distress.     Breath sounds: Normal breath sounds.  Musculoskeletal:     Right ankle: Swelling and ecchymosis present. Normal range of motion.     Left ankle: No swelling or ecchymosis. Normal range of motion.     Comments: Edema and ecchymosis around  right lateral malleolus.  Skin:    General: Skin is warm.     Findings: Ecchymosis (anterior thighs mainly and scattered on distal LE's) and rash present. No erythema. Rash is not nodular or vesicular.     Comments: Micropapular non erythematous rash scattered on upper and lower extremities.  Neurological:     General: No focal deficit present.     Mental Status: She is alert and oriented to person, place, and time.     Cranial Nerves: No cranial nerve deficit.     Gait: Gait normal.  Psychiatric:        Mood and Affect: Mood and affect normal.   ASSESSMENT AND PLAN:  [name] was seen today for ankle swelling and rash.  Lab Results  Component Value Date   WBC 7.0 11/01/2023   HGB 12.1 11/01/2023   HCT 36.9 11/01/2023   MCV 84.4 11/01/2023   PLT 387.0 11/01/2023   Lab Results  Component Value Date   INR 1.2 (H) 11/01/2023   Sprain of right ankle, unspecified  ligament, initial encounter As well as left ankle but milder. There is no bone tenderness and she is able to bare wt with no pain, no limitation of ROM, so I do not think imaging is needed today. Hx of recurrent ankle sprain, I do not think ortho consultation is needed at this time but she will benefit form PT.  -     Ambulatory referral to Physical Therapy  Pruritic rash ? Acute eczema. We discussed treatment options, she would like parenteral systemic steroid instead oral, so after verbal consent she received Methylprednisolone  40 mg IM. She  tolerated well. Continue Zyrtec 10 mg bid x 14 days then once daily, OTC Pepcid  20 mg bid x 14 d, and topica; triamcinolone  bid, small amount,for 14 days on affected area. If recurrent we may need derma consultation.  -     Triamcinolone  Acetonide; Apply 1 Application topically 2 (two) times daily for 14 days.  Dispense: 30 g; Refill: 0 -     methylPREDNISolone  Acetate  Ecchymosis Hx of easy brushing, no other symptoms that suggest a serious coagulation process. Further recommendations according to lab results..  -     CBC with Differential/Platelet; Future -     Protime-INR   Return if symptoms worsen or fail to improve.  I, Leonce PARAS Wierda, acting as a scribe for Daniele Dillow, MD., have documented all relevant documentation on the behalf of Lorielle Boehning, MD, as directed by  Byrant Valent, MD while in the presence of Amrita Radu, MD.   I, Renad Jenniges, MD, have reviewed all documentation for this visit. The documentation on 11/03/23 for the exam, diagnosis, procedures, and orders are all accurate and complete.  Yeiren Whitecotton G. Shalice Woodring, MD  [facility]. [place] office.

## 2023-11-01 NOTE — Patient Instructions (Addendum)
 A few things to remember from today's visit:  Sprain of right ankle, unspecified ligament, initial encounter - Plan: Ambulatory referral to Physical Therapy  Pruritic rash - Plan: triamcinolone  ointment (KENALOG ) 0.5 %  Ecchymosis - Plan: CBC with Differential/Platelet, Protime-INR Zyrtec 10 mg 2 times daily for 14 days then once daily for 14-30 days. Topical Triamcinolone  2 times daily on affected areas for 14 days. Pepcid  20 mg 2 times daily for 2 weeks. Monitor for new symptoms.  Avoid scratching.  If you need refills for medications you take chronically, please call your pharmacy. Do not use My Chart to request refills or for acute issues that need immediate attention. If you send a my chart message, it may take a few days to be addressed, specially if I am not in the office.  Please be sure medication list is accurate. If a new problem present, please set up appointment sooner than planned today.

## 2023-11-28 ENCOUNTER — Encounter: Payer: Self-pay | Admitting: Physical Therapy

## 2023-11-28 ENCOUNTER — Other Ambulatory Visit: Payer: Self-pay

## 2023-11-28 ENCOUNTER — Ambulatory Visit: Payer: BC Managed Care – PPO | Attending: Family Medicine | Admitting: Physical Therapy

## 2023-11-28 DIAGNOSIS — M357 Hypermobility syndrome: Secondary | ICD-10-CM | POA: Insufficient documentation

## 2023-11-28 DIAGNOSIS — S93401A Sprain of unspecified ligament of right ankle, initial encounter: Secondary | ICD-10-CM | POA: Diagnosis not present

## 2023-11-28 DIAGNOSIS — R2243 Localized swelling, mass and lump, lower limb, bilateral: Secondary | ICD-10-CM | POA: Diagnosis present

## 2023-11-28 DIAGNOSIS — R2689 Other abnormalities of gait and mobility: Secondary | ICD-10-CM | POA: Diagnosis present

## 2023-11-28 DIAGNOSIS — R29898 Other symptoms and signs involving the musculoskeletal system: Secondary | ICD-10-CM | POA: Insufficient documentation

## 2023-11-28 NOTE — Therapy (Signed)
 OUTPATIENT PHYSICAL THERAPY LOWER EXTREMITY EVALUATION   Patient Name: Loretta [name] MRN: [id number] DOB:05-07-1991, 33 y.o., female Today's Date: 11/28/2023  END OF SESSION:  PT End of Session - 11/28/23 1614     Visit Number 1    Number of Visits 9   8 + eval   Date for PT Re-Evaluation 02/03/24   pushed out due to patient scheduling needs   Authorization Type BCBS COMM PPO    PT Start Time 1613    PT Stop Time 1658    PT Time Calculation (min) 45 min    Behavior During Therapy [facility] for tasks assessed/performed             Past Medical History:  Diagnosis Date   ACL tear 11/2014   left   Asthma 03/27/2012   Headache(784.0)    stress/migraine   History of cardiac murmur    "innocent murmur" 2011 - states has resolved   History of strabismus    Hypothyroidism    has not taken medication since 09/2014   Neurofibromatosis, type 1 (von Recklinghausen's disease) (HCC)    Seasonal asthma    prn inhaler   Past Surgical History:  Procedure Laterality Date   ANTERIOR CRUCIATE LIGAMENT REPAIR Left scheduled April 2016   Hamstring graft    STRABISMUS SURGERY     age 6   STRABISMUS SURGERY Right 03/01/2014   Procedure: REPAIR STRABISMUS BILATERAL;  Surgeon: Shara Blazing, MD;  Location: [facility];  Service: Ophthalmology;  Laterality: Right;   TUMOR EXCISION Right as an infant   lower eyelid   Patient Active Problem List   Diagnosis Date Noted   Iron deficiency anemia 10/18/2023   Thyroid nodule 10/18/2023   Plantar fasciitis of right foot 08/25/2022   Vitamin D deficiency, unspecified 07/24/2022   Thyromegaly 05/14/2020   Chronic or recurrent subluxation of peroneal tendon of right foot 04/16/2019   PCP NOTES >>>>>>>>>>>>>>>>>>>>>>>>>>>>>> 07/29/2016   GERD (gastroesophageal reflux disease) 11/07/2013   Neurofibromatosis, type 1 (von Recklinghausen's disease) (HCC)    Galactorrhea on left side 02/13/2013   Hypothyroidism 05/05/2012   Dysmenorrhea  04/04/2012   Weight gain 04/04/2012   Asthma 03/27/2012   Allergic rhinitis 03/27/2012   Annual physical exam 03/27/2012    PCP: [place], Betty G, MD  REFERRING PROVIDER: [place], Betty G, MD  REFERRING DIAG: [phone] (ICD-10-CM) - Sprain of right ankle, unspecified ligament, initial encounter  THERAPY DIAG:  Other abnormalities of gait and mobility  Hypermobility syndrome  Localized swelling, mass and lump, lower limb, bilateral  Other symptoms and signs involving the musculoskeletal system  Rationale for Evaluation and Treatment: Rehabilitation  ONSET DATE: 10/28/2023 (date of bilateral ankle sprains)  SUBJECTIVE:   SUBJECTIVE STATEMENT: Pt states she has repeatedly sprained her ankles and this last time was really bad.  She does not want to do this again.  She shows PT pictures of bilateral ankles immediately following sprains - PT unable to visualize edema in photos, but pt has a mass of lateral right ankle bruising in following photo.  She feels she is favoring the right side.  PERTINENT HISTORY: asthma, GERD, hypothyroidism, von Recklinghausen's disease (NF1), iron deficiency anemia, and vitamin D deficiency PAIN:  Are you having pain? No  PRECAUTIONS: None  RED FLAGS: None   WEIGHT BEARING RESTRICTIONS: No  FALLS:  Has patient fallen in last 6 months? Yes. Number of falls once when sprain occurred  LIVING ENVIRONMENT: Lives with: lives with their family Lives in:  House/apartment Stairs: Yes: Internal: 16 steps; on right going up and External: 2 steps; none Has following equipment at home: None  OCCUPATION: Desk work  PLOF: Independent  PATIENT GOALS: "To not twist the ankles as much, to help my mind connect to my ankles"  NEXT MD VISIT: Earley Favor, MD (Gynecology) 04/11/2024  OBJECTIVE:  Note: Objective measures were completed at Evaluation unless otherwise noted.  DIAGNOSTIC FINDINGS: no recent or relevant imaging  PATIENT SURVEYS:  LEFS  63/64; pt elected to not answer 4 activities  COGNITION: Overall cognitive status: Within functional limits for tasks assessed     SENSATION: Light touch: WFL  EDEMA:  Ankles appear symmetrical and without acute edema at present.  POSTURE: No Significant postural limitations  PALPATION: TTP at right lateral and anterior fibulotalar joint space; TTP at left medial distal malleoli  LOWER EXTREMITY ROM:  Active ROM Right eval Left eval  Hip flexion    Hip extension    Hip abduction    Hip adduction    Hip internal rotation    Hip external rotation    Knee flexion    Knee extension    Ankle dorsiflexion 19 (knee bent) 23 (knee bent)  Ankle plantarflexion    Ankle inversion    Ankle eversion     (Blank rows = not tested)  LOWER EXTREMITY MMT:  MMT Right eval Left eval  Hip flexion 5/5  Hip extension   Hip abduction   Hip adduction   Hip internal rotation   Hip external rotation   Knee flexion   Knee extension   Ankle dorsiflexion   Ankle plantarflexion    Ankle inversion    Ankle eversion     (Blank rows = not tested)  LOWER EXTREMITY SPECIAL TESTS:  Ankle special tests: Anterior drawer test: negative, Talar tilt test: positive , and Kleiger test: negative; Talar tilt only positive on R ankle, crepitus in left ankle  FUNCTIONAL TESTS:  5 times sit to stand: 8.03 sec w/o UE support  GAIT: Distance walked: Various clinic distances Assistive device utilized: None Level of assistance: Complete Independence Comments: Good pace, no significant gait deviation - maintains pathway, no LOB.                                                                                                                                TREATMENT DATE:  11/28/2023 Access Code: [email or web address] URL: [email or web address] Date: 11/28/2023 Prepared by: Camille Bal  Exercises - Heel Toe Raises with Counter Support  - 1 x daily - 5 x weekly - 2 sets - 20 reps - Forward T   - 1 x daily - 5 x weekly - 1-2 sets - 10 reps - Standing Single Leg Stance with Counter Support  - 1 x daily - 5 x weekly - 1 sets - 3 reps - 30 seconds hold - Single Leg Balance with Four Way Reach and Rotation  - 1 x  daily - 5 x weekly - 3 sets - 10 reps - Squat to Heel Raise  - 1 x daily - 5 x weekly - 2 sets - 10 reps  PATIENT EDUCATION:  Education details: Initial HEP, PT POC and assessments used and goals to be set.  Take modifications and return to HIIT workouts.  Initial HEP.  Recurrence rate for sprains and why this is - relation to proprioception/kinesthetic impact. Person educated: Patient Education method: Explanation Education comprehension: verbalized understanding, returned demonstration, and verbal cues required  HOME EXERCISE PROGRAM: Access Code: [email or web address] URL: [email or web address] Date: 11/28/2023 Prepared by: Camille Bal  Exercises - Heel Toe Raises with Counter Support  - 1 x daily - 5 x weekly - 2 sets - 20 reps - Forward T  - 1 x daily - 5 x weekly - 1-2 sets - 10 reps - Standing Single Leg Stance with Counter Support  - 1 x daily - 5 x weekly - 1 sets - 3 reps - 30 seconds hold - Single Leg Balance with Four Way Reach and Rotation  - 1 x daily - 5 x weekly - 3 sets - 10 reps - Squat to Heel Raise  - 1 x daily - 5 x weekly - 2 sets - 10 reps  ASSESSMENT:  CLINICAL IMPRESSION: Patient is a 33 y.o. female who was seen today for physical therapy evaluation and treatment for repeated ankle sprains.  Pt has a significant PMH of asthma, GERD, hypothyroidism, von Recklinghausen's disease (NF1), iron deficiency anemia, and vitamin D deficiency.  Identified impairments include TTP at joint line bilaterally, positive right talar tilt test, and hesitancy to return to functional activities.  Evaluation did not indicate fall risk, but she is at risk of sprain recurrence due to lengthy history of prior injuries.  She would benefit from skilled PT to address  impairments as noted and progress towards long term goals.  OBJECTIVE IMPAIRMENTS: decreased activity tolerance, decreased balance, decreased coordination, and decreased strength.   ACTIVITY LIMITATIONS: squatting and locomotion level  PARTICIPATION LIMITATIONS: community activity  PERSONAL FACTORS: Fitness, Past/current experiences, Sex, and 1 comorbidity: prior ankle injuries  are also affecting patient's functional outcome.   REHAB POTENTIAL: Excellent  CLINICAL DECISION MAKING: Stable/uncomplicated  EVALUATION COMPLEXITY: Low   GOALS: Goals reviewed with patient? Yes  SHORT TERM GOALS: Target date: 12/30/2023 Pt will be independent and compliant with initial strength and balance HEP in order to maintain functional progress and improve mobility. Baseline:  Established on eval (3/3) Goal status: INITIAL  LONG TERM GOALS: Target date: 01/27/2024  Pt will be independent and compliant with advanced strength and balance HEP in order to maintain functional progress and improve mobility. Baseline: Established on eval (3/3) Goal status: INITIAL  2.  Pt will demonstrate dynamic stability of bilateral ankles during jumping and landing in order to safely return to gym activities. Baseline: Will address. Goal status: INITIAL  PLAN:  PT FREQUENCY: 1x/week  PT DURATION: 8 weeks  PLANNED INTERVENTIONS: 97164- PT Re-evaluation, 97110-Therapeutic exercises, 97530- Therapeutic activity, 97112- Neuromuscular re-education, 97535- Self Care, 13244- Manual therapy, [phone]- Gait training, [phone]- Electrical stimulation (manual), Patient/Family education, Balance training, Stair training, Taping, Dry Needling, Joint mobilization, Cryotherapy, and Moist heat  PLAN FOR NEXT SESSION: Reactionary balance (slam balls), jumping/landing, jumping on rebounder, jumping jacks and variations, plyometrics, Y balance on compliant surface   Sadie Haber, PT, DPT 11/28/2023, 5:25 PM

## 2023-12-12 ENCOUNTER — Encounter: Payer: Self-pay | Admitting: Physical Therapy

## 2023-12-12 ENCOUNTER — Ambulatory Visit: Admitting: Physical Therapy

## 2023-12-12 DIAGNOSIS — R2689 Other abnormalities of gait and mobility: Secondary | ICD-10-CM | POA: Diagnosis not present

## 2023-12-12 DIAGNOSIS — R2243 Localized swelling, mass and lump, lower limb, bilateral: Secondary | ICD-10-CM

## 2023-12-12 DIAGNOSIS — R29898 Other symptoms and signs involving the musculoskeletal system: Secondary | ICD-10-CM

## 2023-12-12 DIAGNOSIS — M357 Hypermobility syndrome: Secondary | ICD-10-CM

## 2023-12-12 NOTE — Therapy (Signed)
 OUTPATIENT PHYSICAL THERAPY LOWER EXTREMITY TREATMENT   Patient Name: Loretta [name] MRN: [id number] DOB:08/27/1991, 33 y.o., female Today's Date: 12/12/2023  END OF SESSION:  PT End of Session - 12/12/23 1621     Visit Number 2    Number of Visits 9   8 + eval   Date for PT Re-Evaluation 02/03/24   pushed out due to patient scheduling needs   Authorization Type BCBS COMM PPO    PT Start Time 1616    PT Stop Time 1700    PT Time Calculation (min) 44 min    Activity Tolerance Patient tolerated treatment well    Behavior During Therapy [facility] for tasks assessed/performed             Past Medical History:  Diagnosis Date   ACL tear 11/2014   left   Asthma 03/27/2012   Headache(784.0)    stress/migraine   History of cardiac murmur    "innocent murmur" 2011 - states has resolved   History of strabismus    Hypothyroidism    has not taken medication since 09/2014   Neurofibromatosis, type 1 (von Recklinghausen's disease) (HCC)    Seasonal asthma    prn inhaler   Past Surgical History:  Procedure Laterality Date   ANTERIOR CRUCIATE LIGAMENT REPAIR Left scheduled April 2016   Hamstring graft    STRABISMUS SURGERY     age 46   STRABISMUS SURGERY Right 03/01/2014   Procedure: REPAIR STRABISMUS BILATERAL;  Surgeon: Shara Blazing, MD;  Location: [facility];  Service: Ophthalmology;  Laterality: Right;   TUMOR EXCISION Right as an infant   lower eyelid   Patient Active Problem List   Diagnosis Date Noted   Iron deficiency anemia 10/18/2023   Thyroid nodule 10/18/2023   Plantar fasciitis of right foot 08/25/2022   Vitamin D deficiency, unspecified 07/24/2022   Thyromegaly 05/14/2020   Chronic or recurrent subluxation of peroneal tendon of right foot 04/16/2019   PCP NOTES >>>>>>>>>>>>>>>>>>>>>>>>>>>>>> 07/29/2016   GERD (gastroesophageal reflux disease) 11/07/2013   Neurofibromatosis, type 1 (von Recklinghausen's disease) (HCC)    Galactorrhea on left side  02/13/2013   Hypothyroidism 05/05/2012   Dysmenorrhea 04/04/2012   Weight gain 04/04/2012   Asthma 03/27/2012   Allergic rhinitis 03/27/2012   Annual physical exam 03/27/2012    PCP: [place], Betty G, MD  REFERRING PROVIDER: [place], Betty G, MD  REFERRING DIAG: [phone] (ICD-10-CM) - Sprain of right ankle, unspecified ligament, initial encounter  THERAPY DIAG:  Other abnormalities of gait and mobility  Hypermobility syndrome  Localized swelling, mass and lump, lower limb, bilateral  Other symptoms and signs involving the musculoskeletal system  Rationale for Evaluation and Treatment: Rehabilitation  ONSET DATE: 10/28/2023 (date of bilateral ankle sprains)  SUBJECTIVE:   SUBJECTIVE STATEMENT: Pt states she recently worked out and felt some achiness in her left more than right ankles.  She mostly did weights and light cardio.  She tried a light jumping jack task without issue aside from soreness.  She modified other cardio tasks.  PERTINENT HISTORY: asthma, GERD, hypothyroidism, von Recklinghausen's disease (NF1), iron deficiency anemia, and vitamin D deficiency PAIN:  Are you having pain? No  PRECAUTIONS: None  RED FLAGS: None   WEIGHT BEARING RESTRICTIONS: No  FALLS:  Has patient fallen in last 6 months? Yes. Number of falls once when sprain occurred  LIVING ENVIRONMENT: Lives with: lives with their family Lives in: House/apartment Stairs: Yes: Internal: 16 steps; on right going up and External:  2 steps; none Has following equipment at home: None  OCCUPATION: Desk work  PLOF: Independent  PATIENT GOALS: "To not twist the ankles as much, to help my mind connect to my ankles"  NEXT MD VISIT: Earley Favor, MD (Gynecology) 04/11/2024  OBJECTIVE:  Note: Objective measures were completed at Evaluation unless otherwise noted.  DIAGNOSTIC FINDINGS: no recent or relevant imaging  PATIENT SURVEYS:  LEFS 63/64; pt elected to not answer 4  activities  COGNITION: Overall cognitive status: Within functional limits for tasks assessed     SENSATION: Light touch: WFL  EDEMA:  Ankles appear symmetrical and without acute edema at present.  POSTURE: No Significant postural limitations  PALPATION: TTP at right lateral and anterior fibulotalar joint space; TTP at left medial distal malleoli  LOWER EXTREMITY ROM:  Active ROM Right eval Left eval  Hip flexion    Hip extension    Hip abduction    Hip adduction    Hip internal rotation    Hip external rotation    Knee flexion    Knee extension    Ankle dorsiflexion 19 (knee bent) 23 (knee bent)  Ankle plantarflexion    Ankle inversion    Ankle eversion     (Blank rows = not tested)  LOWER EXTREMITY MMT:  MMT Right eval Left eval  Hip flexion 5/5  Hip extension   Hip abduction   Hip adduction   Hip internal rotation   Hip external rotation   Knee flexion   Knee extension   Ankle dorsiflexion   Ankle plantarflexion    Ankle inversion    Ankle eversion     (Blank rows = not tested)  LOWER EXTREMITY SPECIAL TESTS:  Ankle special tests: Anterior drawer test: negative, Talar tilt test: positive , and Kleiger test: negative; Talar tilt only positive on R ankle, crepitus in left ankle  FUNCTIONAL TESTS:  5 times sit to stand: 8.03 sec w/o UE support  GAIT: Distance walked: Various clinic distances Assistive device utilized: None Level of assistance: Complete Independence Comments: Good pace, no significant gait deviation - maintains pathway, no LOB.                                                                                                                                TREATMENT DATE:  12/12/2023 -10lb slam balls walks x30 ft -Standing normal stance on airex 10lb slam balls x20 -Squat into toe raise x5 > squat jumps w/ UE support as needed x15 > soft jump and lands unsupported on firm surface, cued to prevent knee valgus -Squat jumps w/ BUE support  on trampoline > Bouncing on trampoline x1 minute > jumping w/ BUE support x 1 minute > jumping jacks x1 minute progressing to no UE support > scissor jacks x1 minute unsupported -Eccentric heel raises focused on quick raise x20 -8" step ups w/ contralateral knee drive and ipsilateral heel raise x10 each LE using BUE support >  repeated on soft Bosu x10 each LE  PATIENT EDUCATION:  Education details:  Reprinted HEP.  Discussed insert options and shoes for flat foot arch support. Person educated: Patient Education method: Explanation Education comprehension: verbalized understanding, returned demonstration, and verbal cues required  HOME EXERCISE PROGRAM: Access Code: [email or web address] URL: [email or web address] Date: 11/28/2023 Prepared by: Camille Bal  Exercises - Heel Toe Raises with Counter Support  - 1 x daily - 5 x weekly - 2 sets - 20 reps - Forward T  - 1 x daily - 5 x weekly - 1-2 sets - 10 reps - Standing Single Leg Stance with Counter Support  - 1 x daily - 5 x weekly - 1 sets - 3 reps - 30 seconds hold - Single Leg Balance with Four Way Reach and Rotation  - 1 x daily - 5 x weekly - 3 sets - 10 reps - Squat to Heel Raise  - 1 x daily - 5 x weekly - 2 sets - 10 reps  ASSESSMENT:  CLINICAL IMPRESSION: Provided HEP from initial evaluation to patient this visit.  Continued to work on plyometrics style activities to strengthen the BLE and prevent ankle reinjury by relying on proprioceptive and kinesthetic feedback.  She demonstrates good knee position and hip strength during high level challenges.  She was encouraged to return to gentle cardio and to incorporate a shoe with more stability to her everyday workouts.  She continues to benefit from skilled PT in this setting to improve ankle stability and confidence in movement to maintain high baseline level of function.  Continue per POC.  OBJECTIVE IMPAIRMENTS: decreased activity tolerance, decreased balance, decreased  coordination, and decreased strength.   ACTIVITY LIMITATIONS: squatting and locomotion level  PARTICIPATION LIMITATIONS: community activity  PERSONAL FACTORS: Fitness, Past/current experiences, Sex, and 1 comorbidity: prior ankle injuries  are also affecting patient's functional outcome.   REHAB POTENTIAL: Excellent  CLINICAL DECISION MAKING: Stable/uncomplicated  EVALUATION COMPLEXITY: Low   GOALS: Goals reviewed with patient? Yes  SHORT TERM GOALS: Target date: 12/30/2023 Pt will be independent and compliant with initial strength and balance HEP in order to maintain functional progress and improve mobility. Baseline:  Established on eval (3/3) Goal status: INITIAL  LONG TERM GOALS: Target date: 01/27/2024  Pt will be independent and compliant with advanced strength and balance HEP in order to maintain functional progress and improve mobility. Baseline: Established on eval (3/3) Goal status: INITIAL  2.  Pt will demonstrate dynamic stability of bilateral ankles during jumping and landing in order to safely return to gym activities. Baseline: Will address. Goal status: INITIAL  PLAN:  PT FREQUENCY: 1x/week  PT DURATION: 8 weeks  PLANNED INTERVENTIONS: 97164- PT Re-evaluation, 97110-Therapeutic exercises, 97530- Therapeutic activity, [id number]- Neuromuscular re-education, 97535- Self Care, [id number]- Manual therapy, [phone]- Gait training, [phone]- Electrical stimulation (manual), Patient/Family education, Balance training, Stair training, Taping, Dry Needling, Joint mobilization, Cryotherapy, and Moist heat  PLAN FOR NEXT SESSION: plyometrics, Y balance on compliant surface, SLS - slam ball rolls CW/CCW, lunge to dynadisc, squats - staggered   Sadie Haber, PT, DPT 12/12/2023, 5:24 PM

## 2023-12-19 ENCOUNTER — Ambulatory Visit: Admitting: Physical Therapy

## 2023-12-19 ENCOUNTER — Encounter: Payer: Self-pay | Admitting: Physical Therapy

## 2023-12-19 DIAGNOSIS — R2689 Other abnormalities of gait and mobility: Secondary | ICD-10-CM | POA: Diagnosis not present

## 2023-12-19 DIAGNOSIS — M357 Hypermobility syndrome: Secondary | ICD-10-CM

## 2023-12-19 DIAGNOSIS — R2243 Localized swelling, mass and lump, lower limb, bilateral: Secondary | ICD-10-CM

## 2023-12-19 DIAGNOSIS — R29898 Other symptoms and signs involving the musculoskeletal system: Secondary | ICD-10-CM

## 2023-12-19 NOTE — Therapy (Signed)
 OUTPATIENT PHYSICAL THERAPY LOWER EXTREMITY TREATMENT   Patient Name: Loretta [name] MRN: [id number] DOB:03/17/1991, 33 y.o., female Today's Date: 12/19/2023  END OF SESSION:  PT End of Session - 12/19/23 1625     Visit Number 3    Number of Visits 9   8 + eval   Date for PT Re-Evaluation 02/03/24   pushed out due to patient scheduling needs   Authorization Type BCBS COMM PPO    PT Start Time 1620    PT Stop Time 1705    PT Time Calculation (min) 45 min    Activity Tolerance Patient tolerated treatment well    Behavior During Therapy [facility] for tasks assessed/performed             Past Medical History:  Diagnosis Date   ACL tear 11/2014   left   Asthma 03/27/2012   Headache(784.0)    stress/migraine   History of cardiac murmur    "innocent murmur" 2011 - states has resolved   History of strabismus    Hypothyroidism    has not taken medication since 09/2014   Neurofibromatosis, type 1 (von Recklinghausen's disease) (HCC)    Seasonal asthma    prn inhaler   Past Surgical History:  Procedure Laterality Date   ANTERIOR CRUCIATE LIGAMENT REPAIR Left scheduled April 2016   Hamstring graft    STRABISMUS SURGERY     age 69   STRABISMUS SURGERY Right 03/01/2014   Procedure: REPAIR STRABISMUS BILATERAL;  Surgeon: Shara Blazing, MD;  Location: [facility];  Service: Ophthalmology;  Laterality: Right;   TUMOR EXCISION Right as an infant   lower eyelid   Patient Active Problem List   Diagnosis Date Noted   Iron deficiency anemia 10/18/2023   Thyroid nodule 10/18/2023   Plantar fasciitis of right foot 08/25/2022   Vitamin D deficiency, unspecified 07/24/2022   Thyromegaly 05/14/2020   Chronic or recurrent subluxation of peroneal tendon of right foot 04/16/2019   PCP NOTES >>>>>>>>>>>>>>>>>>>>>>>>>>>>>> 07/29/2016   GERD (gastroesophageal reflux disease) 11/07/2013   Neurofibromatosis, type 1 (von Recklinghausen's disease) (HCC)    Galactorrhea on left side  02/13/2013   Hypothyroidism 05/05/2012   Dysmenorrhea 04/04/2012   Weight gain 04/04/2012   Asthma 03/27/2012   Allergic rhinitis 03/27/2012   Annual physical exam 03/27/2012    PCP: [place], Betty G, MD  REFERRING PROVIDER: [place], Betty G, MD  REFERRING DIAG: [phone] (ICD-10-CM) - Sprain of right ankle, unspecified ligament, initial encounter  THERAPY DIAG:  Other abnormalities of gait and mobility  Hypermobility syndrome  Localized swelling, mass and lump, lower limb, bilateral  Other symptoms and signs involving the musculoskeletal system  Rationale for Evaluation and Treatment: Rehabilitation  ONSET DATE: 10/28/2023 (date of bilateral ankle sprains)  SUBJECTIVE:   SUBJECTIVE STATEMENT: Pt states she went to a bachelorette party this weekend and wore heels without issues.  She did a lot of walking and some yoga this weekend which was challenging but manageable.    PERTINENT HISTORY: asthma, GERD, hypothyroidism, von Recklinghausen's disease (NF1), iron deficiency anemia, and vitamin D deficiency PAIN:  Are you having pain? No  PRECAUTIONS: None  RED FLAGS: None   WEIGHT BEARING RESTRICTIONS: No  FALLS:  Has patient fallen in last 6 months? Yes. Number of falls once when sprain occurred  LIVING ENVIRONMENT: Lives with: lives with their family Lives in: House/apartment Stairs: Yes: Internal: 16 steps; on right going up and External: 2 steps; none Has following equipment at home: None  OCCUPATION: Desk work  PLOF: Independent  PATIENT GOALS: "To not twist the ankles as much, to help my mind connect to my ankles"  NEXT MD VISIT: Earley Favor, MD (Gynecology) 04/11/2024  OBJECTIVE:  Note: Objective measures were completed at Evaluation unless otherwise noted.  DIAGNOSTIC FINDINGS: no recent or relevant imaging  PATIENT SURVEYS:  LEFS 63/64; pt elected to not answer 4 activities  COGNITION: Overall cognitive status: Within functional limits  for tasks assessed     SENSATION: Light touch: WFL  EDEMA:  Ankles appear symmetrical and without acute edema at present.  POSTURE: No Significant postural limitations  PALPATION: TTP at right lateral and anterior fibulotalar joint space; TTP at left medial distal malleoli  LOWER EXTREMITY ROM:  Active ROM Right eval Left eval  Hip flexion    Hip extension    Hip abduction    Hip adduction    Hip internal rotation    Hip external rotation    Knee flexion    Knee extension    Ankle dorsiflexion 19 (knee bent) 23 (knee bent)  Ankle plantarflexion    Ankle inversion    Ankle eversion     (Blank rows = not tested)  LOWER EXTREMITY MMT:  MMT Right eval Left eval  Hip flexion 5/5  Hip extension   Hip abduction   Hip adduction   Hip internal rotation   Hip external rotation   Knee flexion   Knee extension   Ankle dorsiflexion   Ankle plantarflexion    Ankle inversion    Ankle eversion     (Blank rows = not tested)  LOWER EXTREMITY SPECIAL TESTS:  Ankle special tests: Anterior drawer test: negative, Talar tilt test: positive , and Kleiger test: negative; Talar tilt only positive on R ankle, crepitus in left ankle  FUNCTIONAL TESTS:  5 times sit to stand: 8.03 sec w/o UE support  GAIT: Distance walked: Various clinic distances Assistive device utilized: None Level of assistance: Complete Independence Comments: Good pace, no significant gait deviation - maintains pathway, no LOB.                                                                                                                                TREATMENT DATE:  12/19/2023 -Squats to heel raise x10 -Squat jumps w/ intermittent BUE support x10 -Jumping jacks x20 -Lateral jumps over line x10 each direction -Y balance to fatigue on each LE standing on airex w/ BUE support -SL squats trialing contralateral leg kickstand backwards vs forward (forward lends to better hip alignment) x10 each  LE -Staggered squats x15 each LE in rear unsupported; good knee tracking at standard depth of squat -Forward lunge to dynadisc x14 progressing to no UE support each LE > lateral lunge to dynadisc x12 each LE progressing to no UE support for proprioceptive feedback and positional awareness and ankle strategy -SLS - slam ball rolls CW/CCW x10 each on each LE, intermittent unilateral  UE support  PATIENT EDUCATION:  Education details:  Continue HEP, walk daily, rejoin exercise classes and modify jumping/running/plyometrics as needed. Person educated: Patient Education method: Explanation Education comprehension: verbalized understanding, returned demonstration, and verbal cues required  HOME EXERCISE PROGRAM: Access Code: [email or web address] URL: [email or web address] Date: 11/28/2023 Prepared by: Camille Bal  Exercises - Heel Toe Raises with Counter Support  - 1 x daily - 5 x weekly - 2 sets - 20 reps - Forward T  - 1 x daily - 5 x weekly - 1-2 sets - 10 reps - Standing Single Leg Stance with Counter Support  - 1 x daily - 5 x weekly - 1 sets - 3 reps - 30 seconds hold - Single Leg Balance with Four Way Reach and Rotation  - 1 x daily - 5 x weekly - 3 sets - 10 reps - Squat to Heel Raise  - 1 x daily - 5 x weekly - 2 sets - 10 reps  ASSESSMENT:  CLINICAL IMPRESSION: Focus of skilled session on progressing to high level proprioceptive and kinesthetic focused tasks to improve ankle musculature recruitment and ankle strategy.  Patient demonstrates good knee and hip alignment throughout.  She does fatigue with higher level tasks, but overall form does not vary.  Progressing to no UE support proved more challenging with compliant surfaces so PT to increase challenge with these style tasks at next session to further improve motor planning.  Continue per POC.  OBJECTIVE IMPAIRMENTS: decreased activity tolerance, decreased balance, decreased coordination, and decreased strength.    ACTIVITY LIMITATIONS: squatting and locomotion level  PARTICIPATION LIMITATIONS: community activity  PERSONAL FACTORS: Fitness, Past/current experiences, Sex, and 1 comorbidity: prior ankle injuries  are also affecting patient's functional outcome.   REHAB POTENTIAL: Excellent  CLINICAL DECISION MAKING: Stable/uncomplicated  EVALUATION COMPLEXITY: Low   GOALS: Goals reviewed with patient? Yes  SHORT TERM GOALS: Target date: 12/30/2023 Pt will be independent and compliant with initial strength and balance HEP in order to maintain functional progress and improve mobility. Baseline:  Established on eval (3/3) Goal status: INITIAL  LONG TERM GOALS: Target date: 01/27/2024  Pt will be independent and compliant with advanced strength and balance HEP in order to maintain functional progress and improve mobility. Baseline: Established on eval (3/3) Goal status: INITIAL  2.  Pt will demonstrate dynamic stability of bilateral ankles during jumping and landing in order to safely return to gym activities. Baseline: Will address. Goal status: INITIAL  PLAN:  PT FREQUENCY: 1x/week  PT DURATION: 8 weeks  PLANNED INTERVENTIONS: 97164- PT Re-evaluation, 97110-Therapeutic exercises, 97530- Therapeutic activity, 97112- Neuromuscular re-education, 97535- Self Care, 54098- Manual therapy, [phone]- Gait training, [phone]- Electrical stimulation (manual), Patient/Family education, Balance training, Stair training, Taping, Dry Needling, Joint mobilization, Cryotherapy, and Moist heat  PLAN FOR NEXT SESSION: plyometrics, downward dog into plank walkout, blaze pods SLS/multi-level, Bosu squats, alternating ball kicks on airex, SLS rebounder > airex, toe walking > heel walking > add bilateral farmer's carry, farmer's carry up stairs, treadmill incline, decline squats, leg press - calf press   Sadie Haber, PT, DPT 12/19/2023, 5:38 PM

## 2023-12-26 ENCOUNTER — Encounter: Payer: Self-pay | Admitting: Physical Therapy

## 2023-12-26 ENCOUNTER — Ambulatory Visit: Admitting: Physical Therapy

## 2023-12-26 DIAGNOSIS — R2689 Other abnormalities of gait and mobility: Secondary | ICD-10-CM | POA: Diagnosis not present

## 2023-12-26 DIAGNOSIS — M357 Hypermobility syndrome: Secondary | ICD-10-CM

## 2023-12-26 DIAGNOSIS — R2243 Localized swelling, mass and lump, lower limb, bilateral: Secondary | ICD-10-CM

## 2023-12-26 DIAGNOSIS — R29898 Other symptoms and signs involving the musculoskeletal system: Secondary | ICD-10-CM

## 2023-12-26 NOTE — Therapy (Signed)
 OUTPATIENT PHYSICAL THERAPY LOWER EXTREMITY TREATMENT   Patient Name: Sophina Mitten MRN: [id number] DOB:1991/03/31, 33 y.o., female Today's Date: 12/26/2023  END OF SESSION:  PT End of Session - 12/26/23 1618     Visit Number 4    Number of Visits 9   8 + eval   Date for PT Re-Evaluation 02/03/24   pushed out due to patient scheduling needs   Authorization Type BCBS COMM PPO    PT Start Time 1613    PT Stop Time 1702    PT Time Calculation (min) 49 min    Activity Tolerance Patient tolerated treatment well    Behavior During Therapy [facility] for tasks assessed/performed             Past Medical History:  Diagnosis Date   ACL tear 11/2014   left   Asthma 03/27/2012   Headache(784.0)    stress/migraine   History of cardiac murmur    "innocent murmur" 2011 - states has resolved   History of strabismus    Hypothyroidism    has not taken medication since 09/2014   Neurofibromatosis, type 1 (von Recklinghausen's disease) (HCC)    Seasonal asthma    prn inhaler   Past Surgical History:  Procedure Laterality Date   ANTERIOR CRUCIATE LIGAMENT REPAIR Left scheduled April 2016   Hamstring graft    STRABISMUS SURGERY     age 89   STRABISMUS SURGERY Right 03/01/2014   Procedure: REPAIR STRABISMUS BILATERAL;  Surgeon: Shara Blazing, MD;  Location: [facility];  Service: Ophthalmology;  Laterality: Right;   TUMOR EXCISION Right as an infant   lower eyelid   Patient Active Problem List   Diagnosis Date Noted   Iron deficiency anemia 10/18/2023   Thyroid nodule 10/18/2023   Plantar fasciitis of right foot 08/25/2022   Vitamin D deficiency, unspecified 07/24/2022   Thyromegaly 05/14/2020   Chronic or recurrent subluxation of peroneal tendon of right foot 04/16/2019   PCP NOTES >>>>>>>>>>>>>>>>>>>>>>>>>>>>>> 07/29/2016   GERD (gastroesophageal reflux disease) 11/07/2013   Neurofibromatosis, type 1 (von Recklinghausen's disease) (HCC)    Galactorrhea on left side  02/13/2013   Hypothyroidism 05/05/2012   Dysmenorrhea 04/04/2012   Weight gain 04/04/2012   Asthma 03/27/2012   Allergic rhinitis 03/27/2012   Annual physical exam 03/27/2012    PCP: [place], Betty G, MD  REFERRING PROVIDER: [place], Betty G, MD  REFERRING DIAG: [phone] (ICD-10-CM) - Sprain of right ankle, unspecified ligament, initial encounter  THERAPY DIAG:  Other abnormalities of gait and mobility  Hypermobility syndrome  Localized swelling, mass and lump, lower limb, bilateral  Other symptoms and signs involving the musculoskeletal system  Rationale for Evaluation and Treatment: Rehabilitation  ONSET DATE: 10/28/2023 (date of bilateral ankle sprains)  SUBJECTIVE:   SUBJECTIVE STATEMENT: Pt has questions about ASO style socks that she is considering returning.  She has been doing cardio with no issues.  She has started returning to hybrid workouts at [employer].  She had an instance of slight overcorrection of the ankle without bruising so she feels she did not reinjure the ankle.   PERTINENT HISTORY: asthma, GERD, hypothyroidism, von Recklinghausen's disease (NF1), iron deficiency anemia, and vitamin D deficiency PAIN:  Are you having pain? No  PRECAUTIONS: None  RED FLAGS: None   WEIGHT BEARING RESTRICTIONS: No  FALLS:  Has patient fallen in last 6 months? Yes. Number of falls once when sprain occurred  LIVING ENVIRONMENT: Lives with: lives with their family Lives in: House/apartment Stairs:  Yes: Internal: 16 steps; on right going up and External: 2 steps; none Has following equipment at home: None  OCCUPATION: Desk work  PLOF: Independent  PATIENT GOALS: "To not twist the ankles as much, to help my mind connect to my ankles"  NEXT MD VISIT: Earley Favor, MD (Gynecology) 04/11/2024  OBJECTIVE:  Note: Objective measures were completed at Evaluation unless otherwise noted.  DIAGNOSTIC FINDINGS: no recent or relevant imaging  PATIENT SURVEYS:  LEFS  63/64; pt elected to not answer 4 activities  COGNITION: Overall cognitive status: Within functional limits for tasks assessed     SENSATION: Light touch: WFL  EDEMA:  Ankles appear symmetrical and without acute edema at present.  POSTURE: No Significant postural limitations  PALPATION: TTP at right lateral and anterior fibulotalar joint space; TTP at left medial distal malleoli  LOWER EXTREMITY ROM:  Active ROM Right eval Left eval  Hip flexion    Hip extension    Hip abduction    Hip adduction    Hip internal rotation    Hip external rotation    Knee flexion    Knee extension    Ankle dorsiflexion 19 (knee bent) 23 (knee bent)  Ankle plantarflexion    Ankle inversion    Ankle eversion     (Blank rows = not tested)  LOWER EXTREMITY MMT:  MMT Right eval Left eval  Hip flexion 5/5  Hip extension   Hip abduction   Hip adduction   Hip internal rotation   Hip external rotation   Knee flexion   Knee extension   Ankle dorsiflexion   Ankle plantarflexion    Ankle inversion    Ankle eversion     (Blank rows = not tested)  LOWER EXTREMITY SPECIAL TESTS:  Ankle special tests: Anterior drawer test: negative, Talar tilt test: positive , and Kleiger test: negative; Talar tilt only positive on R ankle, crepitus in left ankle  FUNCTIONAL TESTS:  5 times sit to stand: 8.03 sec w/o UE support  GAIT: Distance walked: Various clinic distances Assistive device utilized: None Level of assistance: Complete Independence Comments: Good pace, no significant gait deviation - maintains pathway, no LOB.                                                                                                                                TREATMENT DATE:  12/26/2023 -Warmup:  firm surface lunging > jump lunges working into increased ROM > soft bosu lunges x2 minute 40 seconds -Forward fold > inchworm > high plank x10  -Bosu squats 15 unsupported -Unsupported normal BOS on airex ball  kicks alternating LE -Standing unsupported in normal BOS on bosu multidirectional ball toss, pt has excellent righting reactions -Forward and backward toe and heel walking 2x30 ft each > repeated w/ bilateral 10lb farmer's carry -2kg rebounder in SLS x20 each side > added airex repeated task x20 each side; able to hold contralateral LE in rear  or anteriorly in left stance but only anteriorly in right stance, possible worse hip abductor weakness on RLE -Bilateral 10lb farmer's carry 5x4 stairs  PATIENT EDUCATION:  Education details:  Continue HEP w/ additions today, walk daily, modifying box jumps to step up w/ contralateral knee drive - use hand support as needed.  Discussed cross trainers that have more forefoot flexibility vs the ones she has that are too supportive and solid in the arch/forefoot area. Person educated: Patient Education method: Explanation Education comprehension: verbalized understanding, returned demonstration, and verbal cues required  HOME EXERCISE PROGRAM: Access Code: [email or web address] URL: [email or web address] Date: 12/26/2023 Prepared by: Camille Bal  Exercises - Heel Toe Raises with Counter Support  - 1 x daily - 5 x weekly - 2 sets - 20 reps - Forward T  - 1 x daily - 5 x weekly - 1-2 sets - 10 reps - Standing Single Leg Stance with Counter Support  - 1 x daily - 5 x weekly - 1 sets - 3 reps - 30 seconds hold - Single Leg Balance with Four Way Reach and Rotation  - 1 x daily - 5 x weekly - 3 sets - 10 reps - Squat to Heel Raise  - 1 x daily - 5 x weekly - 2 sets - 10 reps - Plank with Hip Extension  - 1 x daily - 5 x weekly - 3 sets - 10 reps - Single Leg Jumps  - 1 x daily - 5 x weekly - 3 sets - 10 reps  ASSESSMENT:  CLINICAL IMPRESSION: Ongoing focus on progressing to high level proprioceptive and kinesthetic focused tasks with pt tolerating more dynamic and SLS tasks this visit.  Made light additions to HEP for improved carryover of feedback  at home and to have pt work on building trust in the dynamic and isometric stability of her ankles.  There is potential she will not continue with PT due to cost of therapy despite insurance, but she will call clinic when she has decided.  Would like to keep appts for now.  Will continue per POC if pt proceeds with appts.  OBJECTIVE IMPAIRMENTS: decreased activity tolerance, decreased balance, decreased coordination, and decreased strength.   ACTIVITY LIMITATIONS: squatting and locomotion level  PARTICIPATION LIMITATIONS: community activity  PERSONAL FACTORS: Fitness, Past/current experiences, Sex, and 1 comorbidity: prior ankle injuries  are also affecting patient's functional outcome.   REHAB POTENTIAL: Excellent  CLINICAL DECISION MAKING: Stable/uncomplicated  EVALUATION COMPLEXITY: Low   GOALS: Goals reviewed with patient? Yes  SHORT TERM GOALS: Target date: 12/30/2023 Pt will be independent and compliant with initial strength and balance HEP in order to maintain functional progress and improve mobility. Baseline:  Established on eval (3/3) Goal status: INITIAL  LONG TERM GOALS: Target date: 01/27/2024  Pt will be independent and compliant with advanced strength and balance HEP in order to maintain functional progress and improve mobility. Baseline: Established on eval (3/3) Goal status: INITIAL  2.  Pt will demonstrate dynamic stability of bilateral ankles during jumping and landing in order to safely return to gym activities. Baseline: Will address. Goal status: INITIAL  PLAN:  PT FREQUENCY: 1x/week  PT DURATION: 8 weeks  PLANNED INTERVENTIONS: 97164- PT Re-evaluation, 97110-Therapeutic exercises, 97530- Therapeutic activity, [id number]- Neuromuscular re-education, 97535- Self Care, 30865- Manual therapy, [phone]- Gait training, [phone]- Electrical stimulation (manual), Patient/Family education, Balance training, Stair training, Taping, Dry Needling, Joint mobilization, Cryotherapy,  and Moist heat  PLAN FOR NEXT SESSION: blaze pods SLS/multi-level, farmer's carry up  stairs, treadmill incline, decline squats, leg press - calf press   Sadie Haber, PT, DPT 12/26/2023, 5:08 PM

## 2023-12-26 NOTE — Patient Instructions (Signed)
 Access Code: [email or web address] URL: [email or web address] Date: 12/26/2023 Prepared by: Camille Bal  Exercises - Heel Toe Raises with Counter Support  - 1 x daily - 5 x weekly - 2 sets - 20 reps - Forward T  - 1 x daily - 5 x weekly - 1-2 sets - 10 reps - Standing Single Leg Stance with Counter Support  - 1 x daily - 5 x weekly - 1 sets - 3 reps - 30 seconds hold - Single Leg Balance with Four Way Reach and Rotation  - 1 x daily - 5 x weekly - 3 sets - 10 reps - Squat to Heel Raise  - 1 x daily - 5 x weekly - 2 sets - 10 reps - Plank with Hip Extension  - 1 x daily - 5 x weekly - 3 sets - 10 reps - Single Leg Jumps  - 1 x daily - 5 x weekly - 3 sets - 10 reps

## 2024-01-02 ENCOUNTER — Ambulatory Visit: Admitting: Physical Therapy

## 2024-01-16 ENCOUNTER — Ambulatory Visit: Admitting: Physical Therapy

## 2024-01-23 ENCOUNTER — Ambulatory Visit: Admitting: Physical Therapy

## 2024-01-30 ENCOUNTER — Ambulatory Visit: Admitting: Physical Therapy

## 2024-02-01 ENCOUNTER — Encounter: Payer: Self-pay | Admitting: Family Medicine

## 2024-02-01 ENCOUNTER — Ambulatory Visit (INDEPENDENT_AMBULATORY_CARE_PROVIDER_SITE_OTHER): Admitting: Family Medicine

## 2024-02-01 VITALS — BP 112/78 | HR 67 | Temp 98.4°F | Resp 12 | Ht 61.0 in | Wt 176.4 lb

## 2024-02-01 DIAGNOSIS — E66811 Obesity, class 1: Secondary | ICD-10-CM | POA: Insufficient documentation

## 2024-02-01 DIAGNOSIS — J4520 Mild intermittent asthma, uncomplicated: Secondary | ICD-10-CM | POA: Diagnosis not present

## 2024-02-01 DIAGNOSIS — Z6833 Body mass index (BMI) 33.0-33.9, adult: Secondary | ICD-10-CM

## 2024-02-01 DIAGNOSIS — E6609 Other obesity due to excess calories: Secondary | ICD-10-CM | POA: Insufficient documentation

## 2024-02-01 DIAGNOSIS — E039 Hypothyroidism, unspecified: Secondary | ICD-10-CM

## 2024-02-01 DIAGNOSIS — F419 Anxiety disorder, unspecified: Secondary | ICD-10-CM | POA: Insufficient documentation

## 2024-02-01 MED ORDER — BUDESONIDE-FORMOTEROL FUMARATE 80-4.5 MCG/ACT IN AERO: 2.0000 | INHALATION_SPRAY | Freq: Two times a day (BID) | RESPIRATORY_TRACT | 3 refills | Status: DC

## 2024-02-01 NOTE — Progress Notes (Signed)
 ACUTE VISIT Chief Complaint  Patient presents with   Medication Refill    Inhaler expired   Allergies   HPI: Loretta [name] is a 33 y.o. female with a PMHx significant for asthma, GERD, hypothyroidism, von Recklinghausen's disease, iron  deficiency anemia, and vitamin D  deficiency, among some, who is here today for medication refill.  Asthma:  Today she requesting refill for albuterol  inhaler. She has had it for years and says she primarily uses it during allergy season for cough and wheezing. Also uses it occasionally when she has an URI and during cold weather.  She had cough and wheezing on 5/3 and has been taking nyquil and zyrtec. Symptoms have improved. Denies fever, chills, body aches, or sick contact.  Hypothyroidism:  Currently on levothyroxine  112 mcg daily. States that she does not take medication daily but trying to do better.  Lab Results  Component Value Date   TSH 4.87 10/24/2023  She recently had some blood work and wellness evaluation at work, BP, cholesterol, and glucose are in normal range.  She was instructed to meet with PCP to discuss her weight. Obesity: Diet: Patient says she eats a lot of sweets and loves bread.  Exercise: She does HIIT workouts a few times per week.  Anxiety:  Patient complains of recent anxiety and says she has some difficulty regulating her emotions.  She meets with a therapist in [place] virtually every month.  She has never taken medication for anxiety in the past because she is nervous about dependency.   Review of Systems  Constitutional:  Negative for activity change and appetite change.  HENT:  Positive for postnasal drip and rhinorrhea. Negative for congestion, nosebleeds and sore throat.   Eyes:  Negative for discharge and redness.  Gastrointestinal:  Negative for abdominal pain, nausea and vomiting.  Skin:  Negative for rash.  Allergic/Immunologic: Positive for environmental allergies.  Neurological:  Negative for  syncope, weakness and headaches.  Psychiatric/Behavioral:  Negative for confusion and hallucinations. The patient is nervous/anxious.   See other pertinent positives and negatives in HPI.  Current Outpatient Medications on File Prior to Visit  Medication Sig Dispense Refill   levothyroxine  (SYNTHROID ) 112 MCG tablet Take 1 tablet (112 mcg total) by mouth daily before breakfast. 90 tablet 1   No current facility-administered medications on file prior to visit.   Past Medical History:  Diagnosis Date   ACL tear 11/2014   left   Asthma 03/27/2012   Headache(784.0)    stress/migraine   History of cardiac murmur    "innocent murmur" 2011 - states has resolved   History of strabismus    Hypothyroidism    has not taken medication since 09/2014   Neurofibromatosis, type 1 (von Recklinghausen's disease) (HCC)    Seasonal asthma    prn inhaler   No Known Allergies  Social History   Socioeconomic History   Marital status: Single    Spouse name: Not on file   Number of children: 0   Years of education: College   Highest education level: Not on file  Occupational History   Occupation: Architect, works full time  Tobacco Use   Smoking status: Never   Smokeless tobacco: Never  Vaping Use   Vaping status: Never Used  Substance and Sexual Activity   Alcohol use: Yes    Comment: social   Drug use: No   Sexual activity: Never    Birth control/protection: Abstinence    Comment: VIRGIN  Other Topics Concern  Not on file  Social History Narrative   G0P0   Patient lives at home with her parents.    Left handed.    Maurie Southern 01-2014   Social Drivers of Health   Financial Resource Strain: Not on file  Food Insecurity: Not on file  Transportation Needs: Not on file  Physical Activity: Not on file  Stress: Not on file  Social Connections: Unknown (02/08/2022)   Received from [facility]   Social Network    Social Network: Not on file   Vitals:    02/01/24 1540  BP: 112/78  Pulse: 67  Resp: 12  Temp: 98.4 F (36.9 C)  SpO2: 97%   Wt Readings from Last 3 Encounters:  02/01/24 176 lb 6.4 oz (80 kg)  11/01/23 172 lb (78 kg)  10/18/23 171 lb (77.6 kg)   Body mass index is 33.33 kg/m.  Physical Exam Vitals and nursing note reviewed.  Constitutional:      General: She is not in acute distress.    Appearance: She is well-developed. She is not ill-appearing.  HENT:     Head: Normocephalic and atraumatic.     Mouth/Throat:     Mouth: Mucous membranes are moist.     Pharynx: Oropharynx is clear. Uvula midline.  Eyes:     Conjunctiva/sclera: Conjunctivae normal.  Neck:     Thyroid : Thyromegaly present.  Cardiovascular:     Rate and Rhythm: Normal rate and regular rhythm.     Heart sounds: No murmur heard. Pulmonary:     Effort: Pulmonary effort is normal. No respiratory distress.     Breath sounds: Normal breath sounds. No stridor. No wheezing.  Lymphadenopathy:     Cervical: No cervical adenopathy.  Skin:    General: Skin is warm.     Findings: No erythema or rash.  Neurological:     Mental Status: She is alert and oriented to person, place, and time.  Psychiatric:        Mood and Affect: Affect normal. Mood is anxious.    ASSESSMENT AND PLAN:  [name] was seen today for medication refill.    Class 1 obesity due to excess calories with serious comorbidity and body mass index (BMI) of 33.0 to 33.9 in adult Assessment & Plan: She understands the benefits of wt loss as well as adverse effects of obesity. Consistency with healthy diet and physical activity encouraged. She agrees with referral to healthy weight and wellness clinic.  Orders: -     Amb Ref to Medical Weight Management  Mild intermittent asthma without complication Assessment & Plan: In general problem is stable, she usually has exacerbations around this time of the year. She has been on albuterol  inhaler as needed, she agrees with trying  Symbicort  80-4.5 mcg twice daily daily during spring and as needed throughout the year. As far as problem is stable, annual follow-ups are appropriate.  Orders: -     Budesonide -Formoterol  Fumarate; Inhale 2 puffs into the lungs 2 (two) times daily.  Dispense: 1 each; Refill: 3  Anxiety disorder, unspecified type Assessment & Plan: Currently on CBT, meets with psychologist through video mildly. She would like to hold on pharmacologic treatment, she is afraid of possible side effects.  She will let me know if she decides to try an SSRI, fluoxetine is to be considered.   Acquired hypothyroidism Assessment & Plan: Discussed diagnosis and treatment options. She acknowledges she is not very consistent with taking medication daily. Last TSH was  in normal range, 4.8 in 09/2023. Continue levothyroxine  112 mcg daily. Pending thyroid  US  to evaluate thyromegaly.  Return if symptoms worsen or fail to improve, for keep next appointment.  I, Fritz Jewel Wierda, acting as a scribe for Omolara Carol [place], MD., have documented all relevant documentation on the behalf of Ifeoluwa Beller [place], MD, as directed by  Raliyah Montella [place], MD while in the presence of Kathrynne Kulinski [place], MD.   I, Rosha Cocker [place], MD, have reviewed all documentation for this visit. The documentation on 02/01/24 for the exam, diagnosis, procedures, and orders are all accurate and complete.  Seyed Heffley G. [place], MD  [facility]. [place] office.

## 2024-02-01 NOTE — Assessment & Plan Note (Signed)
 Currently on CBT, meets with psychologist through video mildly. She would like to hold on pharmacologic treatment, she is afraid of possible side effects.  She will let me know if she decides to try an SSRI, fluoxetine is to be considered.

## 2024-02-01 NOTE — Patient Instructions (Addendum)
 A few things to remember from today's visit:  Class 1 obesity due to excess calories with serious comorbidity and body mass index (BMI) of 33.0 to 33.9 in adult - Plan: Amb Ref to Medical Weight Management  Mild intermittent asthma without complication  Anxiety disorder, unspecified type  Try to avoid foods that contain sugar. Symbicort  to be use as needed and daily during Spring. Let me know if you decide to take medication for anxiety.  If you need refills for medications you take chronically, please call your pharmacy. Do not use My Chart to request refills or for acute issues that need immediate attention. If you send a my chart message, it may take a few days to be addressed, specially if I am not in the office.  Please be sure medication list is accurate. If a new problem present, please set up appointment sooner than planned today.

## 2024-02-01 NOTE — Assessment & Plan Note (Signed)
 In general problem is stable, she usually has exacerbations around this time of the year. She has been on albuterol  inhaler as needed, she agrees with trying Symbicort  80-4.5 mcg twice daily daily during spring and as needed throughout the year. As far as problem is stable, annual follow-ups are appropriate.

## 2024-02-01 NOTE — Assessment & Plan Note (Signed)
 Discussed diagnosis and treatment options. She acknowledges she is not very consistent with taking medication daily. Last TSH was in normal range, 4.8 in 09/2023. Continue levothyroxine  112 mcg daily.

## 2024-02-01 NOTE — Assessment & Plan Note (Addendum)
 She gained about 5 pounds since 09/2023. She understands the benefits of wt loss as well as adverse effects of obesity. Consistency with healthy diet and physical activity encouraged. She agrees with referral to healthy weight and wellness clinic.

## 2024-02-02 ENCOUNTER — Encounter (INDEPENDENT_AMBULATORY_CARE_PROVIDER_SITE_OTHER): Payer: Self-pay

## 2024-02-03 ENCOUNTER — Ambulatory Visit
Admission: RE | Admit: 2024-02-03 | Discharge: 2024-02-03 | Disposition: A | Discharge status: Home / Self Care | Source: Ambulatory Visit | Attending: Family Medicine | Admitting: Family Medicine

## 2024-02-03 DIAGNOSIS — E041 Nontoxic single thyroid nodule: Secondary | ICD-10-CM

## 2024-02-06 ENCOUNTER — Ambulatory Visit: Admitting: Physical Therapy

## 2024-02-07 ENCOUNTER — Ambulatory Visit: Payer: Self-pay | Admitting: Family Medicine

## 2024-04-11 ENCOUNTER — Ambulatory Visit: Payer: BC Managed Care – PPO | Admitting: Obstetrics and Gynecology

## 2024-04-20 ENCOUNTER — Other Ambulatory Visit: Payer: Self-pay | Admitting: Family Medicine

## 2024-04-20 ENCOUNTER — Ambulatory Visit: Admitting: Obstetrics and Gynecology

## 2024-04-20 DIAGNOSIS — E559 Vitamin D deficiency, unspecified: Secondary | ICD-10-CM

## 2024-06-09 ENCOUNTER — Other Ambulatory Visit: Payer: Self-pay | Admitting: Family Medicine

## 2024-06-09 DIAGNOSIS — E039 Hypothyroidism, unspecified: Secondary | ICD-10-CM

## 2024-06-21 ENCOUNTER — Encounter: Payer: Self-pay | Admitting: Obstetrics and Gynecology

## 2024-06-21 ENCOUNTER — Ambulatory Visit (INDEPENDENT_AMBULATORY_CARE_PROVIDER_SITE_OTHER): Admitting: Obstetrics and Gynecology

## 2024-06-21 VITALS — BP 118/80 | HR 81 | Ht 61.0 in | Wt 175.0 lb

## 2024-06-21 DIAGNOSIS — Z1331 Encounter for screening for depression: Secondary | ICD-10-CM | POA: Diagnosis not present

## 2024-06-21 DIAGNOSIS — Z01419 Encounter for gynecological examination (general) (routine) without abnormal findings: Secondary | ICD-10-CM

## 2024-06-21 NOTE — Progress Notes (Signed)
 33 y.o. y.o. female here for annual exam. Patient's last menstrual period was 05/24/2024 (exact date). Period Duration (Days): 4 Period Pattern: (!) Irregular Menstrual Flow: Moderate, Heavy Menstrual Control: Maxi pad, Tampon Menstrual Control Change Freq (Hours): 2-3 Dysmenorrhea: (!) Moderate Dysmenorrhea Symptoms: Headache, Cramping  [place]: completed  .  G0  Single/Virgin.  Lives with parents.  Works in [employer].   RP:  Established patient presenting for annual gyn exam    HPI: Normal menstrual periods every month with normal flow.  No breakthrough bleeding.  Hypothyroidism on Synthroid . No pelvic pain. Normal vaginal secretions.  Virgin.  Pap Negative in 09/2017.  Pap 2024  Bowel movements-some constipation. Breasts normal. Body mass index 30.8 last visit.  Exercises on a regular basis. Health labs with family physician Dr. Amon.   Body mass index is 33.07 kg/m.     06/21/2024    2:52 PM 03/01/2023    3:03 PM 07/23/2022    2:56 PM  Depression screen PHQ 2/9  Decreased Interest 0 0 0  Down, Depressed, Hopeless 0 0 0  PHQ - 2 Score 0 0 0    Blood pressure 118/80, pulse 81, height 5' 1 (1.549 m), weight 175 lb (79.4 kg), last menstrual period 05/24/2024, SpO2 98%.     Component Value Date/Time   DIAGPAP  04/11/2023 1232    - Negative for intraepithelial lesion or malignancy (NILM)   ADEQPAP  04/11/2023 1232    Satisfactory for evaluation; transformation zone component PRESENT.    GYN HISTORY:    Component Value Date/Time   DIAGPAP  04/11/2023 1232    - Negative for intraepithelial lesion or malignancy (NILM)   ADEQPAP  04/11/2023 1232    Satisfactory for evaluation; transformation zone component PRESENT.    OB History  Gravida Para Term Preterm AB Living  0       SAB IAB Ectopic Multiple Live Births          Past Medical History:  Diagnosis Date   ACL tear 11/2014   left   Asthma 03/27/2012   Headache(784.0)    stress/migraine   History  of cardiac murmur    innocent murmur 2011 - states has resolved   History of strabismus    Hypothyroidism    has not taken medication since 09/2014   Neurofibromatosis, type 1 (von Recklinghausen's disease) (HCC)    Seasonal asthma    prn inhaler    Past Surgical History:  Procedure Laterality Date   ANTERIOR CRUCIATE LIGAMENT REPAIR Left scheduled April 2016   Hamstring graft    STRABISMUS SURGERY     age 9   STRABISMUS SURGERY Right 03/01/2014   Procedure: REPAIR STRABISMUS BILATERAL;  Surgeon: Elsie MALVA Salt, MD;  Location: [facility];  Service: Ophthalmology;  Laterality: Right;   TUMOR EXCISION Right as an infant   lower eyelid    Current Outpatient Medications on File Prior to Visit  Medication Sig Dispense Refill   budesonide -formoterol  (SYMBICORT ) 80-4.5 MCG/ACT inhaler Inhale 2 puffs into the lungs 2 (two) times daily. 1 each 3   levothyroxine  (SYNTHROID ) 112 MCG tablet TAKE 1 TABLET BY MOUTH DAILY BEFORE BREAKFAST. 90 tablet 1   No current facility-administered medications on file prior to visit.    Social History   Socioeconomic History   Marital status: Single    Spouse name: Not on file   Number of children: 0   Years of education: College   Highest education level: Not on file  Occupational History   Occupation: Architect, works full time  Tobacco Use   Smoking status: Never   Smokeless tobacco: Never  Vaping Use   Vaping status: Never Used  Substance and Sexual Activity   Alcohol use: Yes    Alcohol/week: 2.0 standard drinks of alcohol    Types: 1 Glasses of wine, 1 Shots of liquor per week    Comment: social   Drug use: No   Sexual activity: Never    Birth control/protection: Abstinence    Comment: VIRGIN - Pt reports self care  Other Topics Concern   Not on file  Social History Narrative   G0P0   Patient lives at home with her parents.    Left handed.    [name] 01-2014   Social Drivers of Health    Financial Resource Strain: Not on file  Food Insecurity: Not on file  Transportation Needs: Not on file  Physical Activity: Not on file  Stress: Not on file  Social Connections: Unknown (02/08/2022)   Received from [facility]   Social Network    Social Network: Not on file  Intimate Partner Violence: Unknown (12/30/2021)   Received from [facility]   HITS    Physically Hurt: Not on file    Insult or Talk Down To: Not on file    Threaten Physical Harm: Not on file    Scream or Curse: Not on file    Family History  Problem Relation Age of Onset   Stomach cancer Father 40   Diabetes Maternal Grandmother      No Known Allergies    Patient's last menstrual period was Patient's last menstrual period was 05/24/2024 (exact date)..            Review of Systems Alls systems reviewed and are negative.     Physical Exam Constitutional:      Appearance: Normal appearance.  Genitourinary:     Vulva and urethral meatus normal.     No lesions in the vagina.     Right Labia: No rash, lesions or skin changes.    Left Labia: No lesions, skin changes or rash.    No vaginal discharge or tenderness.     No vaginal prolapse present.    No vaginal atrophy present.     Right Adnexa: not tender, not palpable and no mass present.    Left Adnexa: not tender, not palpable and no mass present.    No cervical motion tenderness or discharge.     Uterus is not enlarged, tender or irregular.  Breasts:    Right: Normal.     Left: Normal.  HENT:     Head: Normocephalic.  Neck:     Thyroid : No thyroid  mass, thyromegaly or thyroid  tenderness.  Cardiovascular:     Rate and Rhythm: Normal rate and regular rhythm.     Heart sounds: Normal heart sounds, S1 normal and S2 normal.  Pulmonary:     Effort: Pulmonary effort is normal.     Breath sounds: Normal breath sounds and air entry.  Abdominal:     General: There is no distension.     Palpations: Abdomen is soft. There is no mass.      Tenderness: There is no abdominal tenderness. There is no guarding or rebound.  Musculoskeletal:        General: Normal range of motion.     Cervical back: Full passive range of motion without pain, normal range of motion and neck supple. No  tenderness.     Right lower leg: No edema.     Left lower leg: No edema.  Neurological:     Mental Status: She is alert.  Skin:    General: Skin is warm.  Psychiatric:        Mood and Affect: Mood normal.        Behavior: Behavior normal.        Thought Content: Thought content normal.  Vitals and nursing note reviewed. Exam conducted with a chaperone present.       A:         Well Woman GYN exam                             P:        Pap smear not indicated Encouraged annual mammogram screening Colon cancer screening referral placed today  Labs and immunizations to do with PMD Discussed breast self exams Encouraged healthy lifestyle practices Encouraged Vit D and daily MVI  No follow-ups on file.  Loretta [name]

## 2024-08-01 ENCOUNTER — Encounter: Payer: Self-pay | Admitting: Adult Health

## 2024-08-01 ENCOUNTER — Ambulatory Visit: Admitting: Adult Health

## 2024-08-01 VITALS — BP 120/60 | HR 85 | Temp 98.6°F | Wt 177.0 lb

## 2024-08-01 DIAGNOSIS — S39012A Strain of muscle, fascia and tendon of lower back, initial encounter: Secondary | ICD-10-CM

## 2024-08-01 MED ORDER — CYCLOBENZAPRINE HCL 10 MG PO TABS: 10.0000 mg | ORAL_TABLET | Freq: Every day | ORAL | 0 refills | Status: AC

## 2024-08-01 NOTE — Patient Instructions (Addendum)
 I think you have a low back strain   Continue with Motrin and a heading pad  I am going to send in Flexeril which is a muscle relaxer - take this at night

## 2024-08-01 NOTE — Progress Notes (Signed)
 Subjective:    Patient ID: Loretta [name], female    DOB: 11-19-1990, 33 y.o.   MRN: [id number]  HPI  Discussed the use of AI scribe software for clinical note transcription with the patient, who gave verbal consent to proceed.  History of Present Illness   Loretta [name] is a 33 year old female who presents with acute lower back pain.  She woke up this morning with significant stabbing and achy pain across her lower back, worsened by movement such as bending, twisting, and walking. She requires assistance to sit up or stand. Ibuprofen taken at 3 AM did not alleviate the pain, and discomfort persists, especially when sitting for long periods. A lower back support pillow was ineffective. Heating pad did provide relief.   She did not attend work today due to the pain. Her job involves computer tasks and occasional physical activity, which she believes would not have been problematic. She has experienced lower back pain before, but not as severe.  There is no pain radiating down her legs, no urinary symptoms, and no numbness or tingling in her feet. She has not been exercising recently and suspects she may have slept in an awkward position.       Review of Systems See HPI   Past Medical History:  Diagnosis Date   ACL tear 11/2014   left   Asthma 03/27/2012   Headache(784.0)    stress/migraine   History of cardiac murmur    innocent murmur 2011 - states has resolved   History of strabismus    Hypothyroidism    has not taken medication since 09/2014   Neurofibromatosis, type 1 (von Recklinghausen's disease) (HCC)    Seasonal asthma    prn inhaler    Social History   Socioeconomic History   Marital status: Single    Spouse name: Not on file   Number of children: 0   Years of education: College   Highest education level: Not on file  Occupational History   Occupation: recreational therapist, works full time  Tobacco Use   Smoking status: Never   Smokeless tobacco: Never   Vaping Use   Vaping status: Never Used  Substance and Sexual Activity   Alcohol use: Yes    Alcohol/week: 2.0 standard drinks of alcohol    Types: 1 Glasses of wine, 1 Shots of liquor per week    Comment: social   Drug use: No   Sexual activity: Never    Birth control/protection: Abstinence    Comment: VIRGIN - Pt reports self care  Other Topics Concern   Not on file  Social History Narrative   G0P0   Patient lives at home with her parents.    Left handed.    Loretta [name] 01-2014   Social Drivers of Health   Financial Resource Strain: Not on file  Food Insecurity: Not on file  Transportation Needs: Not on file  Physical Activity: Not on file  Stress: Not on file  Social Connections: Unknown (02/08/2022)   Received from [facility]   Social Network    Social Network: Not on file  Intimate Partner Violence: Unknown (12/30/2021)   Received from [facility]   HITS    Physically Hurt: Not on file    Insult or Talk Down To: Not on file    Threaten Physical Harm: Not on file    Scream or Curse: Not on file    Past Surgical History:  Procedure Laterality Date   ANTERIOR CRUCIATE LIGAMENT  REPAIR Left scheduled April 2016   Hamstring graft    STRABISMUS SURGERY     age 59   STRABISMUS SURGERY Right 03/01/2014   Procedure: REPAIR STRABISMUS BILATERAL;  Surgeon: Elsie MALVA Salt, MD;  Location: [facility];  Service: Ophthalmology;  Laterality: Right;   TUMOR EXCISION Right as an infant   lower eyelid    Family History  Problem Relation Age of Onset   Stomach cancer Father 33   Diabetes Maternal Grandmother     No Known Allergies  Current Outpatient Medications on File Prior to Visit  Medication Sig Dispense Refill   budesonide -formoterol  (SYMBICORT ) 80-4.5 MCG/ACT inhaler Inhale 2 puffs into the lungs 2 (two) times daily. 1 each 3   levothyroxine  (SYNTHROID ) 112 MCG tablet TAKE 1 TABLET BY MOUTH DAILY BEFORE BREAKFAST. 90 tablet 1   No current  facility-administered medications on file prior to visit.    BP 120/60   Pulse 85   Temp 98.6 F (37 C) (Oral)   Wt 177 lb (80.3 kg)   SpO2 98%   BMI 33.44 kg/m       Objective:   Physical Exam Vitals and nursing note reviewed.  Constitutional:      Appearance: Normal appearance.   Musculoskeletal:        General: Tenderness present. Normal range of motion.  Skin:    General: Skin is warm and dry.  Neurological:     General: No focal deficit present.     Mental Status: She is alert and oriented to person, place, and time.  Psychiatric:        Mood and Affect: Mood normal.        Behavior: Behavior normal.        Thought Content: Thought content normal.        Judgment: Judgment normal.         Assessment & Plan:   Assessment and Plan    Acute lower back muscle strain Pain likely due to sleeping position, exacerbated by movement. Muscular origin confirmed by tenderness and tension in lower lumbar region. Expected to resolve with conservative management. - Continue ibuprofen for pain and inflammation. - Apply heat using a heating pad. - Take Flexeril at night, up to three times daily if not drowsy. - Perform gentle stretching exercises after warming up. - Consider Epsom salt baths for relaxation. - Provided work note for today, return to work tomorrow without restrictions.      [name], NP

## 2024-10-19 ENCOUNTER — Encounter: Payer: BC Managed Care – PPO | Admitting: Family Medicine

## 2024-10-26 ENCOUNTER — Ambulatory Visit: Payer: Self-pay | Admitting: Family Medicine

## 2024-10-26 ENCOUNTER — Encounter: Payer: Self-pay | Admitting: Family Medicine

## 2024-10-26 ENCOUNTER — Ambulatory Visit (INDEPENDENT_AMBULATORY_CARE_PROVIDER_SITE_OTHER): Admitting: Family Medicine

## 2024-10-26 VITALS — BP 124/82 | HR 88 | Temp 98.9°F | Resp 16 | Ht 61.0 in | Wt 179.2 lb

## 2024-10-26 DIAGNOSIS — Z13228 Encounter for screening for other metabolic disorders: Secondary | ICD-10-CM

## 2024-10-26 DIAGNOSIS — E039 Hypothyroidism, unspecified: Secondary | ICD-10-CM

## 2024-10-26 DIAGNOSIS — E559 Vitamin D deficiency, unspecified: Secondary | ICD-10-CM

## 2024-10-26 DIAGNOSIS — E041 Nontoxic single thyroid nodule: Secondary | ICD-10-CM | POA: Diagnosis not present

## 2024-10-26 DIAGNOSIS — J4520 Mild intermittent asthma, uncomplicated: Secondary | ICD-10-CM | POA: Diagnosis not present

## 2024-10-26 DIAGNOSIS — Z13220 Encounter for screening for lipoid disorders: Secondary | ICD-10-CM | POA: Diagnosis not present

## 2024-10-26 DIAGNOSIS — Q8501 Neurofibromatosis, type 1: Secondary | ICD-10-CM

## 2024-10-26 DIAGNOSIS — Z0000 Encounter for general adult medical examination without abnormal findings: Secondary | ICD-10-CM

## 2024-10-26 DIAGNOSIS — R7401 Elevation of levels of liver transaminase levels: Secondary | ICD-10-CM

## 2024-10-26 DIAGNOSIS — Z1329 Encounter for screening for other suspected endocrine disorder: Secondary | ICD-10-CM

## 2024-10-26 DIAGNOSIS — Z130 Encounter for screening for diseases of the blood and blood-forming organs and certain disorders involving the immune mechanism: Secondary | ICD-10-CM | POA: Diagnosis not present

## 2024-10-26 LAB — CBC WITH DIFFERENTIAL/PLATELET
Basophils Absolute: 0.1 10*3/uL (ref 0.0–0.1)
Basophils Relative: 1 % (ref 0.0–3.0)
Eosinophils Absolute: 0.2 10*3/uL (ref 0.0–0.7)
Eosinophils Relative: 2 % (ref 0.0–5.0)
HCT: 37.7 % (ref 36.0–46.0)
Hemoglobin: 12.4 g/dL (ref 12.0–15.0)
Lymphocytes Relative: 19.9 % (ref 12.0–46.0)
Lymphs Abs: 1.5 10*3/uL (ref 0.7–4.0)
MCHC: 33 g/dL (ref 30.0–36.0)
MCV: 82.5 fl (ref 78.0–100.0)
Monocytes Absolute: 0.7 10*3/uL (ref 0.1–1.0)
Monocytes Relative: 8.7 % (ref 3.0–12.0)
Neutro Abs: 5.2 10*3/uL (ref 1.4–7.7)
Neutrophils Relative %: 68.4 % (ref 43.0–77.0)
Platelets: 407 10*3/uL — ABNORMAL HIGH (ref 150.0–400.0)
RBC: 4.57 Mil/uL (ref 3.87–5.11)
RDW: 14.2 % (ref 11.5–15.5)
WBC: 7.6 10*3/uL (ref 4.0–10.5)

## 2024-10-26 LAB — COMPREHENSIVE METABOLIC PANEL WITH GFR
ALT: 38 U/L — ABNORMAL HIGH (ref 3–35)
AST: 22 U/L (ref 5–37)
Albumin: 4.5 g/dL (ref 3.5–5.2)
Alkaline Phosphatase: 49 U/L (ref 39–117)
BUN: 15 mg/dL (ref 6–23)
CO2: 26 meq/L (ref 19–32)
Calcium: 9.8 mg/dL (ref 8.4–10.5)
Chloride: 105 meq/L (ref 96–112)
Creatinine, Ser: 0.81 mg/dL (ref 0.40–1.20)
GFR: 95.64 mL/min
Glucose, Bld: 94 mg/dL (ref 70–99)
Potassium: 3.8 meq/L (ref 3.5–5.1)
Sodium: 140 meq/L (ref 135–145)
Total Bilirubin: 0.5 mg/dL (ref 0.2–1.2)
Total Protein: 7.7 g/dL (ref 6.0–8.3)

## 2024-10-26 LAB — T4, FREE: Free T4: 0.64 ng/dL (ref 0.60–1.60)

## 2024-10-26 LAB — TSH: TSH: 13.18 u[IU]/mL — ABNORMAL HIGH (ref 0.35–5.50)

## 2024-10-26 LAB — LIPID PANEL
Cholesterol: 188 mg/dL (ref 28–200)
HDL: 49.9 mg/dL
LDL Cholesterol: 121 mg/dL — ABNORMAL HIGH (ref 10–99)
NonHDL: 138.15
Total CHOL/HDL Ratio: 4
Triglycerides: 88 mg/dL (ref 10.0–149.0)
VLDL: 17.6 mg/dL (ref 0.0–40.0)

## 2024-10-26 LAB — VITAMIN D 25 HYDROXY (VIT D DEFICIENCY, FRACTURES): VITD: 17.05 ng/mL — ABNORMAL LOW (ref 30.00–100.00)

## 2024-10-26 LAB — HEMOGLOBIN A1C: Hgb A1c MFr Bld: 5.9 % (ref 4.6–6.5)

## 2024-10-26 MED ORDER — BUDESONIDE-FORMOTEROL FUMARATE 80-4.5 MCG/ACT IN AERO: 2.0000 | INHALATION_SPRAY | Freq: Two times a day (BID) | RESPIRATORY_TRACT | 3 refills | Status: AC

## 2024-10-26 MED ORDER — VITAMIN D (ERGOCALCIFEROL) 1.25 MG (50000 UNIT) PO CAPS: 50000.0000 [IU] | ORAL_CAPSULE | ORAL | 0 refills | Status: AC

## 2024-10-26 MED ORDER — LEVOTHYROXINE SODIUM 112 MCG PO TABS: 112.0000 ug | ORAL_TABLET | Freq: Every day | ORAL | 1 refills | Status: AC

## 2024-10-26 NOTE — Assessment & Plan Note (Signed)
 We discussed the importance of regular physical activity and healthy diet for prevention of chronic illness and/or complications. Preventive guidelines reviewed. Vaccination up to date. Cervical cancer screening up-to-date, continue following with her gynecologist for her female preventive care. Next CPE in a year.

## 2024-10-26 NOTE — Assessment & Plan Note (Signed)
 Problem yeast well-controlled. Continue Symbicort  80-4.5 mcg twice daily as needed.

## 2024-10-26 NOTE — Patient Instructions (Addendum)
 A few things to remember from today's visit:  Acquired hypothyroidism - Plan: TSH, T4, free  Vitamin D  deficiency, unspecified - Plan: Comprehensive metabolic panel with GFR, VITAMIN D  25 Hydroxy (Vit-D Deficiency, Fractures)  Mild intermittent asthma without complication  Neurofibromatosis, type 1 (von Recklinghausen's disease) (HCC) - Plan: CBC with Differential/Platelet  Screening for lipid disorders - Plan: Lipid panel  Routine general medical examination at a health care facility  Screening for endocrine, metabolic and immunity disorder - Plan: Comprehensive metabolic panel with GFR, Hemoglobin A1c  If you need refills for medications you take chronically, please call your pharmacy. Do not use My Chart to request refills or for acute issues that need immediate attention. If you send a my chart message, it may take a few days to be addressed, specially if I am not in the office.  Please be sure medication list is accurate. If a new problem present, please set up appointment sooner than planned today.  Health Maintenance, Female Adopting a healthy lifestyle and getting preventive care are important in promoting health and wellness. Ask your health care provider about: The right schedule for you to have regular tests and exams. Things you can do on your own to prevent diseases and keep yourself healthy. What should I know about diet, weight, and exercise? Eat a healthy diet  Eat a diet that includes plenty of vegetables, fruits, low-fat dairy products, and lean protein. Do not eat a lot of foods that are high in solid fats, added sugars, or sodium. Maintain a healthy weight Body mass index (BMI) is used to identify weight problems. It estimates body fat based on height and weight. Your health care provider can help determine your BMI and help you achieve or maintain a healthy weight. Get regular exercise Get regular exercise. This is one of the most important things you can do  for your health. Most adults should: Exercise for at least 150 minutes each week. The exercise should increase your heart rate and make you sweat (moderate-intensity exercise). Do strengthening exercises at least twice a week. This is in addition to the moderate-intensity exercise. Spend less time sitting. Even light physical activity can be beneficial. Watch cholesterol and blood lipids Have your blood tested for lipids and cholesterol at 33 years of age, then have this test every 5 years. Have your cholesterol levels checked more often if: Your lipid or cholesterol levels are high. You are older than 33 years of age. You are at high risk for heart disease. What should I know about cancer screening? Depending on your health history and family history, you may need to have cancer screening at various ages. This may include screening for: Breast cancer. Cervical cancer. Colorectal cancer. Skin cancer. Lung cancer. What should I know about heart disease, diabetes, and high blood pressure? Blood pressure and heart disease High blood pressure causes heart disease and increases the risk of stroke. This is more likely to develop in people who have high blood pressure readings or are overweight. Have your blood pressure checked: Every 3-5 years if you are 7-46 years of age. Every year if you are 65 years old or older. Diabetes Have regular diabetes screenings. This checks your fasting blood sugar level. Have the screening done: Once every three years after age 25 if you are at a normal weight and have a low risk for diabetes. More often and at a younger age if you are overweight or have a high risk for diabetes. What should I know  about preventing infection? Hepatitis B If you have a higher risk for hepatitis B, you should be screened for this virus. Talk with your health care provider to find out if you are at risk for hepatitis B infection. Hepatitis C Testing is recommended for: Everyone  born from [birth year] through 1964. Anyone with known risk factors for hepatitis C. Sexually transmitted infections (STIs) Get screened for STIs, including gonorrhea and chlamydia, if: You are sexually active and are younger than 33 years of age. You are older than 33 years of age and your health care provider tells you that you are at risk for this type of infection. Your sexual activity has changed since you were last screened, and you are at increased risk for chlamydia or gonorrhea. Ask your health care provider if you are at risk. Ask your health care provider about whether you are at high risk for HIV. Your health care provider may recommend a prescription medicine to help prevent HIV infection. If you choose to take medicine to prevent HIV, you should first get tested for HIV. You should then be tested every 3 months for as long as you are taking the medicine. Pregnancy If you are about to stop having your period (premenopausal) and you may become pregnant, seek counseling before you get pregnant. Take 400 to 800 micrograms (mcg) of folic acid every day if you become pregnant. Ask for birth control (contraception) if you want to prevent pregnancy. Osteoporosis and menopause Osteoporosis is a disease in which the bones lose minerals and strength with aging. This can result in bone fractures. If you are 33 years old or older, or if you are at risk for osteoporosis and fractures, ask your health care provider if you should: Be screened for bone loss. Take a calcium or vitamin D  supplement to lower your risk of fractures. Be given hormone replacement therapy (HRT) to treat symptoms of menopause. Follow these instructions at home: Alcohol use Do not drink alcohol if: Your health care provider tells you not to drink. You are pregnant, may be pregnant, or are planning to become pregnant. If you drink alcohol: Limit how much you have to: 0-1 drink a day. Know how much alcohol is in your drink. In  the [place] one drink equals one 12 oz bottle of beer (355 mL), one 5 oz glass of wine (148 mL), or one 1 oz glass of hard liquor (44 mL). Lifestyle Do not use any products that contain nicotine or tobacco. These products include cigarettes, chewing tobacco, and vaping devices, such as e-cigarettes. If you need help quitting, ask your health care provider. Do not use street drugs. Do not share needles. Ask your health care provider for help if you need support or information about quitting drugs. General instructions Schedule regular health, dental, and eye exams. Stay current with your vaccines. Tell your health care provider if: You often feel depressed. You have ever been abused or do not feel safe at home. This information is not intended to replace advice given to you by your health care provider. Make sure you discuss any questions you have with your health care provider. Document Revised: 03/21/2024 Document Reviewed: 02/02/2021 Elsevier Patient Education  2025 [employer].

## 2024-10-26 NOTE — Assessment & Plan Note (Signed)
 Problem has been stable for the past 4 years. Due for thyroid  US  in 04/2025, if stable no further follow-up will be recommended.

## 2024-10-26 NOTE — Telephone Encounter (Signed)
 Patient informed and voiced understanding. Patient did not have any further questions or concerns.

## 2024-10-26 NOTE — Assessment & Plan Note (Signed)
 No concerns in this regard today. Mild. Has had neurofibromas removed and has a few scattered hyperpigmented macular lesions.

## 2024-10-26 NOTE — Progress Notes (Signed)
 "  Chief Complaint  Patient presents with   Annual Exam   Discussed the use of AI scribe software for clinical note transcription with the patient, who gave verbal consent to proceed. History of Present Illness Loretta [name] is a 34 year old female with PMHx significant for asthma, GERD, hypothyroidism, iron  deficiency anemia, vitamin D  deficiency, and von Recklinghausen's disease here today for her routine physical.  Last CPE: Over a year ago. Last seen on 02/01/24. She saw her gynecologist on 06/21/2024.  She reports irregular exercise routine, mainly on weekends, including weight lifting, cardio, and occasional Pilates.  She lives with her parents, and her mother cooks at home, but she does not eat vegetables daily.  Her sleep varies between 5 to 8 hours, often disrupted by her mother's work schedule, as she sometimes drives her to work early in the morning.  She consumes alcohol socially a few weekends per month and does not smoke.  She has not had her dental cleaning this year but plans to see her dentist in March. She also wears glasses and last visited her eye care provider in July.  Immunization History  Administered Date(s) Administered   DTaP 12/20/1991, 03/13/1992, 05/22/1995, 04/10/1996, 05/15/1996   Dtap, Unspecified 12/20/1991, 03/13/1992, 05/22/1995, 04/10/1996, 05/15/1996   HIB (PRP-OMP) 12/20/1991, 03/13/1992, 02/26/1993, 05/15/1996   HIB, Unspecified 12/20/1991, 03/13/1992, 05/15/1992, 02/26/1993   HPV Quadrivalent 10/10/2009, 12/26/2009, 04/30/2010   Hep B, Unspecified 12/20/1991, 03/13/1992, 12/18/1992   Hepatitis B 12/20/1991, 03/13/1992, 12/18/1992   IPV 03/15/1992, 05/21/1993, 04/10/1996   Influenza Split 06/29/2012   Influenza-Unspecified 07/06/2024   MMR 02/26/1993, 04/14/1996   PFIZER(Purple Top)SARS-COV-2 Vaccination 11/17/2019, 12/11/2019   Polio, Unspecified 03/15/1992, 05/21/1993, 04/10/1996   Td 06/02/2009   Tdap 06/02/2009, 09/17/2019   Health  Maintenance  Topic Date Due   Pneumococcal Vaccine (1 of 2 - PCV) Never done   COVID-19 Vaccine (3 - Pfizer risk series) 01/08/2020   Cervical Cancer Screening (HPV/Pap Cotest)  04/10/2026   DTaP/Tdap/Td (8 - Td or Tdap) 09/16/2029   Influenza Vaccine  Completed   HPV VACCINES  Completed   Hepatitis C Screening  Completed   HIV Screening  Completed   Meningococcal B Vaccine  Aged Out   Hepatitis B Vaccines 19-59 Average Risk  Discontinued   She reports that she has been taking selenium intermittently.  Asthma: She uses Symbicort  inhaler 80-4.5 mcg daily as needed.  Hypothyroidism and thyroid  nodule: She has not been taking levothyroxine  112 mcg for the past few months.  Lab Results  Component Value Date   TSH 4.87 10/24/2023   Thyroid  US  02/03/2024: 1. Grossly unchanged TI-RADS category 3 nodule versus pseudo nodule in the left lower gland dating back to August of 2021. This exam confirms nearly 4 years of stability. Recommend 1 additional follow-up ultrasound in August of 2026 to confirm 5 years of stability and thus benignity. 2. Diffusely heterogeneous thyroid  gland. The imaging appearance suggests either diffuse goitrous change or chronic autoimmune thyroiditis.  Lab Results  Component Value Date   CHOL 173 10/24/2023   HDL 55.50 10/24/2023   LDLCALC 99 10/24/2023   TRIG 91.0 10/24/2023   CHOLHDL 3 10/24/2023   Lab Results  Component Value Date   NA 138 10/24/2023   CL 105 10/24/2023   K 4.0 10/24/2023   CO2 26 10/24/2023   BUN 14 10/24/2023   CREATININE 0.79 10/24/2023   GFR 99.25 10/24/2023   CALCIUM 9.4 10/24/2023   ALBUMIN 4.4 01/19/2022   GLUCOSE 91 10/24/2023  Lab Results  Component Value Date   WBC 7.0 11/01/2023   HGB 12.1 11/01/2023   HCT 36.9 11/01/2023   MCV 84.4 11/01/2023   PLT 387.0 11/01/2023   Vitamin D  deficiency: She has not been on vitamin D  supplementation for some time now. Lab Results  Component Value Date   VD25OH 15.31 (L)  10/24/2023   Review of Systems  Constitutional:  Positive for fatigue. Negative for activity change, appetite change and fever.  HENT:  Negative for mouth sores, sore throat and trouble swallowing.   Eyes:  Negative for redness and visual disturbance.  Respiratory:  Negative for cough, shortness of breath and wheezing.   Cardiovascular:  Negative for chest pain and leg swelling.  Gastrointestinal:  Negative for abdominal pain, nausea and vomiting.  Endocrine: Negative for cold intolerance, heat intolerance, polydipsia, polyphagia and polyuria.  Genitourinary:  Negative for decreased urine volume, dysuria and hematuria.  Musculoskeletal:  Negative for gait problem and myalgias.  Skin:  Negative for color change and rash.  Allergic/Immunologic: Positive for environmental allergies.  Neurological:  Negative for seizures, syncope, weakness and headaches.  Hematological:  Negative for adenopathy. Does not bruise/bleed easily.  Psychiatric/Behavioral:  Negative for confusion and hallucinations.   All other systems reviewed and are negative.  Medications Ordered Prior to Encounter[1]  Past Medical History:  Diagnosis Date   ACL tear 11/2014   left   Asthma 03/27/2012   Headache(784.0)    stress/migraine   History of cardiac murmur    innocent murmur 2011 - states has resolved   History of strabismus    Hypothyroidism    has not taken medication since 09/2014   Neurofibromatosis, type 1 (von Recklinghausen's disease) (HCC)    Seasonal asthma    prn inhaler   Past Surgical History:  Procedure Laterality Date   ANTERIOR CRUCIATE LIGAMENT REPAIR Left scheduled April 2016   Hamstring graft    STRABISMUS SURGERY     age 39   STRABISMUS SURGERY Right 03/01/2014   Procedure: REPAIR STRABISMUS BILATERAL;  Surgeon: Elsie MALVA Salt, MD;  Location: [facility];  Service: Ophthalmology;  Laterality: Right;   TUMOR EXCISION Right as an infant   lower eyelid    Allergies[2]  Family History  Problem Relation Age of Onset   Stomach cancer Father 13   Diabetes Maternal Grandmother    Social History   Socioeconomic History   Marital status: Single    Spouse name: Not on file   Number of children: 0   Years of education: College   Highest education level: Not on file  Occupational History   Occupation: architect, works full time  Tobacco Use   Smoking status: Never   Smokeless tobacco: Never  Vaping Use   Vaping status: Never Used  Substance and Sexual Activity   Alcohol use: Yes    Alcohol/week: 2.0 standard drinks of alcohol    Types: 1 Glasses of wine, 1 Shots of liquor per week    Comment: social   Drug use: No   Sexual activity: Never    Birth control/protection: Abstinence    Comment: [place] - Pt reports self care  Other Topics Concern   Not on file  Social History Narrative   G0P0   Patient lives at home with her parents.    Left handed.    [name] [id number]   Social Drivers of Health   Tobacco Use: Low Risk (10/26/2024)   Patient History    Smoking Tobacco  Use: Never    Smokeless Tobacco Use: Never    Passive Exposure: Not on file  Financial Resource Strain: Not on file  Food Insecurity: Not on file  Transportation Needs: Not on file  Physical Activity: Not on file  Stress: Not on file  Social Connections: Unknown (02/08/2022)   Received from [facility]   Social Network    Social Network: Not on file  Depression (PHQ2-9): Low Risk (06/21/2024)   Depression (PHQ2-9)    PHQ-2 Score: 0  Alcohol Screen: Not on file  Housing: Not on file  Utilities: Not on file  Health Literacy: Not on file   Vitals:   10/26/24 0709  BP: 124/82  Pulse: 88  Resp: 16  Temp: 98.9 F (37.2 C)  SpO2: 97%   Body mass index is 33.86 kg/m.  Wt Readings from Last 3 Encounters:  10/26/24 179 lb 3.2 oz (81.3 kg)  08/01/24 177 lb (80.3 kg)  06/21/24 175 lb (79.4 kg)   Physical Exam Vitals and nursing note  reviewed.  Constitutional:      General: She is not in acute distress.    Appearance: She is well-developed.  HENT:     Head: Normocephalic and atraumatic.     Right Ear: Hearing, tympanic membrane, ear canal and external ear normal.     Left Ear: Hearing, tympanic membrane, ear canal and external ear normal.     Mouth/Throat:     Mouth: Mucous membranes are moist.     Pharynx: Oropharynx is clear. Uvula midline.  Eyes:     Extraocular Movements: Extraocular movements intact.     Conjunctiva/sclera: Conjunctivae normal.     Pupils: Pupils are equal, round, and reactive to light.  Neck:     Thyroid : Thyromegaly (nodular) present.  Cardiovascular:     Rate and Rhythm: Normal rate and regular rhythm.     Pulses:          Dorsalis pedis pulses are 2+ on the right side and 2+ on the left side.     Heart sounds: No murmur heard. Pulmonary:     Effort: Pulmonary effort is normal. No respiratory distress.     Breath sounds: Normal breath sounds.  Abdominal:     Palpations: Abdomen is soft. There is no hepatomegaly or mass.     Tenderness: There is no abdominal tenderness.  Genitourinary:    Comments: Deferred to gyn. Musculoskeletal:     Comments: No major deformity or signs of synovitis appreciated.  Lymphadenopathy:     Cervical: No cervical adenopathy.     Upper Body:     Right upper body: No supraclavicular adenopathy.     Left upper body: No supraclavicular adenopathy.  Skin:    General: Skin is warm.     Findings: No erythema or rash.  Neurological:     General: No focal deficit present.     Mental Status: She is alert and oriented to person, place, and time.     Cranial Nerves: No cranial nerve deficit.     Coordination: Coordination normal.     Gait: Gait normal.     Deep Tendon Reflexes:     Reflex Scores:      Bicep reflexes are 2+ on the right side and 2+ on the left side.      Patellar reflexes are 2+ on the right side and 2+ on the left side. Psychiatric:         Mood and Affect: Mood and affect normal.  ASSESSMENT AND PLAN: [name] was here today annual physical examination.  Orders Placed This Encounter  Procedures   CBC with Differential/Platelet   Comprehensive metabolic panel with GFR   Lipid panel   TSH   VITAMIN D  25 Hydroxy (Vit-D Deficiency, Fractures)   T4, free   Hemoglobin A1c   Lab Results  Component Value Date   HGBA1C 5.9 10/26/2024   Lab Results  Component Value Date   CHOL 188 10/26/2024   HDL 49.90 10/26/2024   LDLCALC 121 (H) 10/26/2024   TRIG 88.0 10/26/2024   CHOLHDL 4 10/26/2024   Lab Results  Component Value Date   VD25OH 17.05 (L) 10/26/2024   Lab Results  Component Value Date   NA 140 10/26/2024   CL 105 10/26/2024   K 3.8 10/26/2024   CO2 26 10/26/2024   BUN 15 10/26/2024   CREATININE 0.81 10/26/2024   GFR 95.64 10/26/2024   CALCIUM 9.8 10/26/2024   ALBUMIN 4.5 10/26/2024   GLUCOSE 94 10/26/2024   Lab Results  Component Value Date   ALT 38 (H) 10/26/2024   AST 22 10/26/2024   ALKPHOS 49 10/26/2024   BILITOT 0.5 10/26/2024   Lab Results  Component Value Date   WBC 7.6 10/26/2024   HGB 12.4 10/26/2024   HCT 37.7 10/26/2024   MCV 82.5 10/26/2024   PLT 407.0 (H) 10/26/2024   Lab Results  Component Value Date   TSH 13.18 (H) 10/26/2024   Routine general medical examination at a health care facility Assessment & Plan: We discussed the importance of regular physical activity and healthy diet for prevention of chronic illness and/or complications. Preventive guidelines reviewed. Vaccination up to date. Cervical cancer screening up-to-date, continue following with her gynecologist for her female preventive care. Next CPE in a year.   Acquired hypothyroidism Assessment & Plan: She has not been taking her levothyroxine  112 mcg for the past few months. Further recommendation will be given according to TSI result.  Orders: -     TSH; Future -     T4, free;  Future  Vitamin D  deficiency, unspecified Assessment & Plan: Currently she is not on vitamin D  supplementation. Further recommendation will be given according to 25 OH vitamin D  result.  Orders: -     Comprehensive metabolic panel with GFR; Future -     VITAMIN D  25 Hydroxy (Vit-D Deficiency, Fractures); Future  Mild intermittent asthma without complication Assessment & Plan: Problem yeast well-controlled. Continue Symbicort  80-4.5 mcg twice daily as needed.   Neurofibromatosis, type 1 (von Recklinghausen's disease) (HCC) Assessment & Plan: No concerns in this regard today. Mild. Has had neurofibromas removed and has a few scattered hyperpigmented macular lesions.  Orders: -     CBC with Differential/Platelet; Future  Thyroid  nodule Assessment & Plan: Problem has been stable for the past 4 years. Due for thyroid  US  in 04/2025, if stable no further follow-up will be recommended.   Screening for lipid disorders -     Lipid panel; Future  Screening for endocrine, metabolic and immunity disorder -     Comprehensive metabolic panel with GFR; Future -     Hemoglobin A1c; Future   Return in 1 year (on 10/26/2025) for CPE, chronic problems.  Samuele Storey G. Zarin Knupp, MD  [facility]. [place] office.     [1]  Current Outpatient Medications on File Prior to Visit  Medication Sig Dispense Refill   budesonide -formoterol  (SYMBICORT ) 80-4.5 MCG/ACT inhaler Inhale 2 puffs into the lungs 2 (two)  times daily. 1 each 3   cyclobenzaprine  (FLEXERIL ) 10 MG tablet Take 1 tablet (10 mg total) by mouth at bedtime. 15 tablet 0   levothyroxine  (SYNTHROID ) 112 MCG tablet TAKE 1 TABLET BY MOUTH DAILY BEFORE BREAKFAST. 90 tablet 1   No current facility-administered medications on file prior to visit.  [2] No Known Allergies  "

## 2024-10-26 NOTE — Assessment & Plan Note (Signed)
Currently she is not on vitamin D supplementation. Further recommendation will be given according to 25 OH vitamin D result.
